# Patient Record
Sex: Female | Born: 1940 | Race: White | Hispanic: No | Smoking: Never smoker
Health system: Southern US, Community
[De-identification: ages and names within clinical notes are randomized; demographics above are authoritative.]

## PROBLEM LIST (undated history)

## (undated) DIAGNOSIS — I10 Essential (primary) hypertension: Secondary | ICD-10-CM

## (undated) DIAGNOSIS — E119 Type 2 diabetes mellitus without complications: Secondary | ICD-10-CM

## (undated) DIAGNOSIS — I4891 Unspecified atrial fibrillation: Secondary | ICD-10-CM

## (undated) DIAGNOSIS — C9000 Multiple myeloma not having achieved remission: Secondary | ICD-10-CM

## (undated) DIAGNOSIS — N289 Disorder of kidney and ureter, unspecified: Secondary | ICD-10-CM

## (undated) DIAGNOSIS — R42 Dizziness and giddiness: Secondary | ICD-10-CM

## (undated) HISTORY — PX: SMALL INTESTINE SURGERY: SHX150

## (undated) HISTORY — PX: ABDOMINAL HYSTERECTOMY: SHX81

---

## 2015-02-20 ENCOUNTER — Inpatient Hospital Stay
Admission: EM | Admit: 2015-02-20 | Discharge: 2015-02-23 | DRG: 149 | Disposition: A | Discharge status: Home / Self Care | Payer: Medicare (Managed Care) | Attending: Internal Medicine | Admitting: Internal Medicine

## 2015-02-20 DIAGNOSIS — R42 Dizziness and giddiness: Secondary | ICD-10-CM

## 2015-02-20 DIAGNOSIS — M7989 Other specified soft tissue disorders: Secondary | ICD-10-CM | POA: Diagnosis present

## 2015-02-20 DIAGNOSIS — Z833 Family history of diabetes mellitus: Secondary | ICD-10-CM

## 2015-02-20 DIAGNOSIS — E1165 Type 2 diabetes mellitus with hyperglycemia: Secondary | ICD-10-CM | POA: Diagnosis present

## 2015-02-20 DIAGNOSIS — E785 Hyperlipidemia, unspecified: Secondary | ICD-10-CM | POA: Diagnosis present

## 2015-02-20 DIAGNOSIS — R0902 Hypoxemia: Secondary | ICD-10-CM

## 2015-02-20 DIAGNOSIS — R7989 Other specified abnormal findings of blood chemistry: Secondary | ICD-10-CM

## 2015-02-20 DIAGNOSIS — R778 Other specified abnormalities of plasma proteins: Secondary | ICD-10-CM

## 2015-02-20 DIAGNOSIS — E871 Hypo-osmolality and hyponatremia: Secondary | ICD-10-CM | POA: Diagnosis present

## 2015-02-20 DIAGNOSIS — R748 Abnormal levels of other serum enzymes: Secondary | ICD-10-CM | POA: Diagnosis present

## 2015-02-20 DIAGNOSIS — R609 Edema, unspecified: Secondary | ICD-10-CM

## 2015-02-20 DIAGNOSIS — H8110 Benign paroxysmal vertigo, unspecified ear: Principal | ICD-10-CM | POA: Diagnosis present

## 2015-02-20 DIAGNOSIS — E1110 Type 2 diabetes mellitus with ketoacidosis without coma: Secondary | ICD-10-CM

## 2015-02-20 DIAGNOSIS — R739 Hyperglycemia, unspecified: Secondary | ICD-10-CM

## 2015-02-20 DIAGNOSIS — R112 Nausea with vomiting, unspecified: Secondary | ICD-10-CM

## 2015-02-20 DIAGNOSIS — I10 Essential (primary) hypertension: Secondary | ICD-10-CM | POA: Diagnosis present

## 2015-02-20 DIAGNOSIS — N289 Disorder of kidney and ureter, unspecified: Secondary | ICD-10-CM | POA: Diagnosis present

## 2015-02-20 DIAGNOSIS — N390 Urinary tract infection, site not specified: Secondary | ICD-10-CM | POA: Diagnosis present

## 2015-02-20 DIAGNOSIS — E8770 Fluid overload, unspecified: Secondary | ICD-10-CM | POA: Diagnosis present

## 2015-02-20 DIAGNOSIS — Z809 Family history of malignant neoplasm, unspecified: Secondary | ICD-10-CM

## 2015-02-20 DIAGNOSIS — Z7982 Long term (current) use of aspirin: Secondary | ICD-10-CM

## 2015-02-20 DIAGNOSIS — Z9079 Acquired absence of other genital organ(s): Secondary | ICD-10-CM | POA: Diagnosis present

## 2015-02-20 DIAGNOSIS — I159 Secondary hypertension, unspecified: Secondary | ICD-10-CM

## 2015-02-20 HISTORY — DX: Dizziness and giddiness: R42

## 2015-02-20 HISTORY — DX: Essential (primary) hypertension: I10

## 2015-02-20 HISTORY — DX: Type 2 diabetes mellitus without complications: E11.9

## 2015-02-21 ENCOUNTER — Encounter: Payer: Self-pay | Admitting: Emergency Medicine

## 2015-02-21 ENCOUNTER — Inpatient Hospital Stay: Payer: Medicare (Managed Care)

## 2015-02-21 ENCOUNTER — Inpatient Hospital Stay (HOSPITAL_COMMUNITY)
Admit: 2015-02-21 | Discharge: 2015-02-21 | Disposition: A | Discharge status: Home / Self Care | Payer: Medicare (Managed Care) | Attending: Internal Medicine | Admitting: Internal Medicine

## 2015-02-21 ENCOUNTER — Emergency Department: Payer: Medicare (Managed Care)

## 2015-02-21 DIAGNOSIS — E1165 Type 2 diabetes mellitus with hyperglycemia: Secondary | ICD-10-CM | POA: Diagnosis present

## 2015-02-21 DIAGNOSIS — Z7982 Long term (current) use of aspirin: Secondary | ICD-10-CM | POA: Diagnosis not present

## 2015-02-21 DIAGNOSIS — R739 Hyperglycemia, unspecified: Secondary | ICD-10-CM | POA: Diagnosis present

## 2015-02-21 DIAGNOSIS — Z809 Family history of malignant neoplasm, unspecified: Secondary | ICD-10-CM | POA: Diagnosis not present

## 2015-02-21 DIAGNOSIS — I10 Essential (primary) hypertension: Secondary | ICD-10-CM | POA: Diagnosis not present

## 2015-02-21 DIAGNOSIS — R42 Dizziness and giddiness: Secondary | ICD-10-CM | POA: Diagnosis present

## 2015-02-21 DIAGNOSIS — E785 Hyperlipidemia, unspecified: Secondary | ICD-10-CM | POA: Diagnosis present

## 2015-02-21 DIAGNOSIS — H8110 Benign paroxysmal vertigo, unspecified ear: Secondary | ICD-10-CM | POA: Diagnosis present

## 2015-02-21 DIAGNOSIS — R778 Other specified abnormalities of plasma proteins: Secondary | ICD-10-CM | POA: Diagnosis present

## 2015-02-21 DIAGNOSIS — I119 Hypertensive heart disease without heart failure: Secondary | ICD-10-CM

## 2015-02-21 DIAGNOSIS — R748 Abnormal levels of other serum enzymes: Secondary | ICD-10-CM | POA: Diagnosis present

## 2015-02-21 DIAGNOSIS — M7989 Other specified soft tissue disorders: Secondary | ICD-10-CM | POA: Diagnosis present

## 2015-02-21 DIAGNOSIS — R7989 Other specified abnormal findings of blood chemistry: Secondary | ICD-10-CM | POA: Diagnosis not present

## 2015-02-21 DIAGNOSIS — Z9079 Acquired absence of other genital organ(s): Secondary | ICD-10-CM | POA: Diagnosis present

## 2015-02-21 DIAGNOSIS — N289 Disorder of kidney and ureter, unspecified: Secondary | ICD-10-CM | POA: Diagnosis present

## 2015-02-21 DIAGNOSIS — E871 Hypo-osmolality and hyponatremia: Secondary | ICD-10-CM | POA: Diagnosis present

## 2015-02-21 DIAGNOSIS — Z833 Family history of diabetes mellitus: Secondary | ICD-10-CM | POA: Diagnosis not present

## 2015-02-21 DIAGNOSIS — N390 Urinary tract infection, site not specified: Secondary | ICD-10-CM | POA: Diagnosis present

## 2015-02-21 DIAGNOSIS — R0902 Hypoxemia: Secondary | ICD-10-CM | POA: Diagnosis present

## 2015-02-21 DIAGNOSIS — E8770 Fluid overload, unspecified: Secondary | ICD-10-CM | POA: Diagnosis present

## 2015-02-21 LAB — BASIC METABOLIC PANEL
Anion gap: 5 (ref 5–15)
BUN: 15 mg/dL (ref 6–20)
CO2: 24 mmol/L (ref 22–32)
Calcium: 7.8 mg/dL — ABNORMAL LOW (ref 8.9–10.3)
Chloride: 106 mmol/L (ref 101–111)
Creatinine, Ser: 1.11 mg/dL — ABNORMAL HIGH (ref 0.44–1.00)
GFR calc Af Amer: 55 mL/min — ABNORMAL LOW (ref >60)
GFR calc non Af Amer: 47 mL/min — ABNORMAL LOW (ref >60)
GLUCOSE: 367 mg/dL — AB (ref 65–99)
Potassium: 3.8 mmol/L (ref 3.5–5.1)
SODIUM: 135 mmol/L (ref 135–145)

## 2015-02-21 LAB — TROPONIN I
TROPONIN I: 0.07 ng/mL — AB (ref <0.031)
Troponin I: 0.04 ng/mL — ABNORMAL HIGH (ref <0.031)
Troponin I: 0.07 ng/mL — ABNORMAL HIGH (ref <0.031)

## 2015-02-21 LAB — URINALYSIS COMPLETE WITH MICROSCOPIC (ARMC ONLY)
BACTERIA UA: NONE SEEN
BILIRUBIN URINE: NEGATIVE
KETONES UR: NEGATIVE mg/dL
LEUKOCYTES UA: NEGATIVE
Nitrite: NEGATIVE
Protein, ur: >500 mg/dL — AB
Specific Gravity, Urine: 1.025 (ref 1.005–1.030)
pH: 5 (ref 5.0–8.0)

## 2015-02-21 LAB — GLUCOSE, CAPILLARY
GLUCOSE-CAPILLARY: 355 mg/dL — AB (ref 65–99)
Glucose-Capillary: 393 mg/dL — ABNORMAL HIGH (ref 65–99)
Glucose-Capillary: 308 mg/dL — ABNORMAL HIGH (ref 65–99)
Glucose-Capillary: 319 mg/dL — ABNORMAL HIGH (ref 65–99)
Glucose-Capillary: 254 mg/dL — ABNORMAL HIGH (ref 65–99)

## 2015-02-21 LAB — COMPREHENSIVE METABOLIC PANEL
ALBUMIN: 2.1 g/dL — AB (ref 3.5–5.0)
ALT: 24 U/L (ref 14–54)
AST: 44 U/L — AB (ref 15–41)
Alkaline Phosphatase: 82 U/L (ref 38–126)
Anion gap: 6 (ref 5–15)
BUN: 15 mg/dL (ref 6–20)
CALCIUM: 8.2 mg/dL — AB (ref 8.9–10.3)
CO2: 25 mmol/L (ref 22–32)
Chloride: 102 mmol/L (ref 101–111)
Creatinine, Ser: 1.23 mg/dL — ABNORMAL HIGH (ref 0.44–1.00)
GFR calc Af Amer: 48 mL/min — ABNORMAL LOW (ref >60)
GFR calc non Af Amer: 42 mL/min — ABNORMAL LOW (ref >60)
Glucose, Bld: 482 mg/dL — ABNORMAL HIGH (ref 65–99)
Potassium: 3.6 mmol/L (ref 3.5–5.1)
SODIUM: 133 mmol/L — AB (ref 135–145)
TOTAL PROTEIN: 7.7 g/dL (ref 6.5–8.1)
Total Bilirubin: 0.6 mg/dL (ref 0.3–1.2)

## 2015-02-21 LAB — CBC
HEMATOCRIT: 42.5 % (ref 35.0–47.0)
HEMOGLOBIN: 14.4 g/dL (ref 12.0–16.0)
MCH: 33 pg (ref 26.0–34.0)
MCHC: 33.9 g/dL (ref 32.0–36.0)
MCV: 97.5 fL (ref 80.0–100.0)
Platelets: 184 10*3/uL (ref 150–440)
RBC: 4.36 MIL/uL (ref 3.80–5.20)
RDW: 13.5 % (ref 11.5–14.5)
WBC: 7.2 10*3/uL (ref 3.6–11.0)

## 2015-02-21 LAB — LIPID PANEL
CHOLESTEROL: 203 mg/dL — AB (ref 0–200)
HDL: 37 mg/dL — ABNORMAL LOW (ref >40)
LDL Cholesterol: 141 mg/dL — ABNORMAL HIGH (ref 0–99)
TRIGLYCERIDES: 127 mg/dL (ref <150)
Total CHOL/HDL Ratio: 5.5 RATIO
VLDL: 25 mg/dL (ref 0–40)

## 2015-02-21 MED ORDER — DIAZEPAM 5 MG PO TABS
5.0000 mg | ORAL_TABLET | Freq: Four times a day (QID) | ORAL | Status: DC | PRN
Start: 2015-02-21 — End: 2015-02-23

## 2015-02-21 MED ORDER — HYDROCHLOROTHIAZIDE 12.5 MG PO CAPS
12.5000 mg | ORAL_CAPSULE | Freq: Every day | ORAL | Status: DC
  Administered 2015-02-21 – 2015-02-23 (×3): 12.5 mg via ORAL
  Filled 2015-02-21 (×3): qty 1

## 2015-02-21 MED ORDER — SODIUM CHLORIDE 0.9 % IJ SOLN
3.0000 mL | Freq: Two times a day (BID) | INTRAMUSCULAR | Status: DC
  Administered 2015-02-21 – 2015-02-23 (×4): 3 mL via INTRAVENOUS

## 2015-02-21 MED ORDER — ACETAMINOPHEN 325 MG PO TABS: 650.0000 mg | ORAL_TABLET | Freq: Four times a day (QID) | ORAL | Status: DC | PRN

## 2015-02-21 MED ORDER — DIAZEPAM 5 MG/ML IJ SOLN
INTRAMUSCULAR | Status: AC
  Filled 2015-02-21: qty 2

## 2015-02-21 MED ORDER — DIAZEPAM 5 MG/ML IJ SOLN
2.0000 mg | Freq: Once | INTRAMUSCULAR | Status: AC
  Administered 2015-02-21: 2 mg via INTRAVENOUS

## 2015-02-21 MED ORDER — MECLIZINE HCL 25 MG PO TABS: 25.0000 mg | ORAL_TABLET | Freq: Three times a day (TID) | ORAL | Status: DC | PRN

## 2015-02-21 MED ORDER — SODIUM CHLORIDE 0.9 % IV SOLN
INTRAVENOUS | Status: DC
  Administered 2015-02-21: 05:00:00 via INTRAVENOUS

## 2015-02-21 MED ORDER — LISINOPRIL 5 MG PO TABS
5.0000 mg | ORAL_TABLET | Freq: Every day | ORAL | Status: DC
  Administered 2015-02-21 – 2015-02-23 (×3): 5 mg via ORAL
  Filled 2015-02-21 (×3): qty 1

## 2015-02-21 MED ORDER — SODIUM CHLORIDE 0.9 % IV BOLUS (SEPSIS)
1000.0000 mL | Freq: Once | INTRAVENOUS | Status: AC
  Administered 2015-02-21: 1000 mL via INTRAVENOUS

## 2015-02-21 MED ORDER — METHYLPREDNISOLONE 4 MG PO TBPK
8.0000 mg | ORAL_TABLET | Freq: Every morning | ORAL | Status: AC
  Administered 2015-02-21: 8 mg via ORAL
  Filled 2015-02-21: qty 21

## 2015-02-21 MED ORDER — METHYLPREDNISOLONE 4 MG PO TBPK
8.0000 mg | ORAL_TABLET | Freq: Every evening | ORAL | Status: AC
  Administered 2015-02-21: 8 mg via ORAL

## 2015-02-21 MED ORDER — ONDANSETRON HCL 4 MG/2ML IJ SOLN
4.0000 mg | Freq: Once | INTRAMUSCULAR | Status: AC
  Administered 2015-02-21: 4 mg via INTRAVENOUS

## 2015-02-21 MED ORDER — METOPROLOL TARTRATE 25 MG PO TABS
12.5000 mg | ORAL_TABLET | Freq: Two times a day (BID) | ORAL | Status: DC
  Administered 2015-02-21: 12.5 mg via ORAL
  Filled 2015-02-21: qty 1

## 2015-02-21 MED ORDER — SODIUM CHLORIDE 0.9 % IV BOLUS (SEPSIS)
500.0000 mL | Freq: Once | INTRAVENOUS | Status: AC
  Administered 2015-02-21: 500 mL via INTRAVENOUS

## 2015-02-21 MED ORDER — ONDANSETRON HCL 4 MG/2ML IJ SOLN
INTRAMUSCULAR | Status: AC
  Filled 2015-02-21: qty 2

## 2015-02-21 MED ORDER — ONDANSETRON HCL 4 MG/2ML IJ SOLN: 4.0000 mg | Freq: Four times a day (QID) | INTRAMUSCULAR | Status: DC | PRN

## 2015-02-21 MED ORDER — ASPIRIN EC 81 MG PO TBEC
81.0000 mg | DELAYED_RELEASE_TABLET | Freq: Every day | ORAL | Status: DC
  Administered 2015-02-21 – 2015-02-23 (×3): 81 mg via ORAL
  Filled 2015-02-21 (×3): qty 1

## 2015-02-21 MED ORDER — INSULIN ASPART 100 UNIT/ML ~~LOC~~ SOLN
0.0000 [IU] | Freq: Three times a day (TID) | SUBCUTANEOUS | Status: DC
  Administered 2015-02-21 (×2): 7 [IU] via SUBCUTANEOUS
  Administered 2015-02-21: 5 [IU] via SUBCUTANEOUS
  Administered 2015-02-22: 7 [IU] via SUBCUTANEOUS
  Administered 2015-02-22: 2 [IU] via SUBCUTANEOUS
  Administered 2015-02-22: 9 [IU] via SUBCUTANEOUS
  Filled 2015-02-21: qty 7
  Filled 2015-02-21: qty 9
  Filled 2015-02-21: qty 7
  Filled 2015-02-21: qty 5
  Filled 2015-02-21: qty 2
  Filled 2015-02-21: qty 7

## 2015-02-21 MED ORDER — INSULIN ASPART 100 UNIT/ML ~~LOC~~ SOLN
8.0000 [IU] | Freq: Once | SUBCUTANEOUS | Status: AC
  Administered 2015-02-21: 8 [IU] via SUBCUTANEOUS

## 2015-02-21 MED ORDER — ATORVASTATIN CALCIUM 20 MG PO TABS
40.0000 mg | ORAL_TABLET | Freq: Every day | ORAL | Status: DC
  Administered 2015-02-21 – 2015-02-22 (×2): 40 mg via ORAL
  Filled 2015-02-21 (×2): qty 2

## 2015-02-21 MED ORDER — ACETAMINOPHEN 650 MG RE SUPP: 650.0000 mg | Freq: Four times a day (QID) | RECTAL | Status: DC | PRN

## 2015-02-21 MED ORDER — METHYLPREDNISOLONE 4 MG PO TBPK
4.0000 mg | ORAL_TABLET | Freq: Four times a day (QID) | ORAL | Status: DC
  Administered 2015-02-23 (×2): 4 mg via ORAL

## 2015-02-21 MED ORDER — METHYLPREDNISOLONE 4 MG PO TBPK
4.0000 mg | ORAL_TABLET | Freq: Three times a day (TID) | ORAL | Status: AC
  Administered 2015-02-22 (×3): 4 mg via ORAL

## 2015-02-21 MED ORDER — ONDANSETRON HCL 4 MG PO TABS: 4.0000 mg | ORAL_TABLET | Freq: Four times a day (QID) | ORAL | Status: DC | PRN

## 2015-02-21 MED ORDER — METHYLPREDNISOLONE 4 MG PO TBPK
4.0000 mg | ORAL_TABLET | ORAL | Status: AC
  Administered 2015-02-21: 4 mg via ORAL

## 2015-02-21 MED ORDER — METHYLPREDNISOLONE 4 MG PO TBPK
8.0000 mg | ORAL_TABLET | Freq: Every evening | ORAL | Status: AC
  Administered 2015-02-22: 8 mg via ORAL

## 2015-02-21 MED ORDER — MECLIZINE HCL 25 MG PO TABS
25.0000 mg | ORAL_TABLET | Freq: Three times a day (TID) | ORAL | Status: DC
  Administered 2015-02-21 – 2015-02-23 (×7): 25 mg via ORAL
  Filled 2015-02-21 (×7): qty 1

## 2015-02-21 MED ORDER — HEPARIN SODIUM (PORCINE) 5000 UNIT/ML IJ SOLN
5000.0000 [IU] | Freq: Three times a day (TID) | INTRAMUSCULAR | Status: DC
  Administered 2015-02-21 – 2015-02-23 (×7): 5000 [IU] via SUBCUTANEOUS
  Filled 2015-02-21 (×7): qty 1

## 2015-02-21 MED ORDER — INSULIN ASPART 100 UNIT/ML ~~LOC~~ SOLN
SUBCUTANEOUS | Status: AC
  Filled 2015-02-21: qty 8

## 2015-02-21 NOTE — Progress Notes (Signed)
*  PRELIMINARY RESULTS* Echocardiogram 2D Echocardiogram has been performed.  Lisa Lowery 02/21/2015, 10:51 AM

## 2015-02-21 NOTE — Consult Note (Addendum)
CARDIOLOGY CONSULT NOTE     Primary Care Physician: No PCP Per Patient Referring Physician:   hospitalist  Admit Date: 02/20/2015  Reason for consultation:  Elevated  troponin  Lisa Lowery is a 74 y.o. female with a h/o hypertension and vertigo who is admitted with dizziness.  She reports having vertiginous spinning similar to prior vertigo.  She denies chest pain.  She has rare chest tightness/ SOB.  She has had BLE swelling for "15 years", perhaps mildly worse now.  She presents for evaluation of her vertigo and is noted to have a very elevated bp as well as elevated blood sugars and proteinuria.  Today, she denies symptoms of palpitations, chest pain,  orthopnea, PND, presyncope, syncope, or neurologic sequela. The patient is tolerating medications without difficulties and is otherwise without complaint today.   Past Medical History  Diagnosis Date  . Vertigo   . Hypertension   . Diabetes    Past Surgical History  Procedure Laterality Date  . Small intestine surgery    . Abdominal hysterectomy      . aspirin EC  81 mg Oral Daily  . atorvastatin  40 mg Oral q1800  . heparin  5,000 Units Subcutaneous 3 times per day  . insulin aspart  0-9 Units Subcutaneous TID WC  . meclizine  25 mg Oral TID  . methylPREDNISolone  4 mg Oral PC supper  . [START ON 02/22/2015] methylPREDNISolone  4 mg Oral 3 x daily with food  . [START ON 02/23/2015] methylPREDNISolone  4 mg Oral 4X daily taper  . methylPREDNISolone  8 mg Oral Nightly  . [START ON 02/22/2015] methylPREDNISolone  8 mg Oral Nightly  . metoprolol tartrate  12.5 mg Oral BID  . sodium chloride  3 mL Intravenous Q12H   . sodium chloride 75 mL/hr at 02/21/15 0620    No Known Allergies  History   Social History  . Marital Status: Divorced    Spouse Name: N/A  . Number of Children: N/A  . Years of Education: N/A   Occupational History  . Not on file.   Social History Main Topics  . Smoking status: Never Smoker   .  Smokeless tobacco: Never Used  . Alcohol Use: No  . Drug Use: No  . Sexual Activity: No   Other Topics Concern  . Not on file   Social History Narrative    Family History  Problem Relation Age of Onset  . Cancer Mother   . Cancer Father   . Diabetes Sister     ROS- All systems are reviewed and negative except as per the HPI above  Physical Exam: Telemetry: Filed Vitals:   02/21/15 0330 02/21/15 0345 02/21/15 0510 02/21/15 1130  BP: 115/49  137/67 161/64  Pulse: 81 81 81 85  Temp:   97.7 F (36.5 C) 98.6 F (37 C)  TempSrc:   Oral Oral  Resp: 12 13 18 18   Height:      Weight:   87.635 kg (193 lb 3.2 oz)   SpO2: 95% 95% 97% 94%    GEN- The patient is overweight appearing, alert and oriented x 3 today.   Head- normocephalic, atraumatic Eyes-  Sclera clear, conjunctiva pink Ears- hearing intact Oropharynx- clear Neck- supple, + JVD Lymph- no cervical lymphadenopathy Lungs- Clear to ausculation bilaterally, normal work of breathing Heart- Regular rate and rhythm, no murmurs, rubs or gallops, PMI not laterally displaced GI- soft, NT, ND, + BS Extremities- no clubbing, cyanosis,trace edema, + venous  insufficiency MS- no significant deformity or atrophy Skin- no rash or lesion Psych- euthymic mood, full affect Neuro- strength and sensation are intact  EKG: reveals sinus rhythm with LAD, no ischemic changes  Labs:   Lab Results  Component Value Date   WBC 7.2 02/21/2015   HGB 14.4 02/21/2015   HCT 42.5 02/21/2015   MCV 97.5 02/21/2015   PLT 184 02/21/2015    Recent Labs Lab 02/21/15 0032 02/21/15 0535  NA 133* 135  K 3.6 3.8  CL 102 106  CO2 25 24  BUN 15 15  CREATININE 1.23* 1.11*  CALCIUM 8.2* 7.8*  PROT 7.7  --   BILITOT 0.6  --   ALKPHOS 82  --   ALT 24  --   AST 44*  --   GLUCOSE 482* 367*   Lab Results  Component Value Date   TROPONINI 0.07* 02/21/2015    Lab Results  Component Value Date   CHOL 203* 02/21/2015   Lab Results    Component Value Date   HDL 37* 02/21/2015   Lab Results  Component Value Date   LDLCALC 141* 02/21/2015   Lab Results  Component Value Date   TRIG 127 02/21/2015   Lab Results  Component Value Date   CHOLHDL 5.5 02/21/2015   No results found for: LDLDIRECT    Echo:  Normal echo  ASSESSMENT AND PLAN:   1. Elevated troponin Troponin is minimally elevated and flat.  There are no ischemic ekg changes or symptoms of ischemia.  She is not having an MI. Given mild SOB/ chest tightness and risk factors, outpatient stress testing would be reasonable once her vertigo/ BP are better managed.  2. Hypertension Elevated/ labile BP Lifestyle modification is encouraged Would add ace inhibitor given proteinuria 2 gram sodium restriction Add low dose diuretic as she is volume overloaded on exam  3. Diabetes New diagnosis Will require aggressive lifestyle change Per primary team  4. HL Statin has already been added  No further inpatient CV testing Outpatient stress testing as above  Thompson Grayer, MD 02/21/2015  2:58 PM

## 2015-02-21 NOTE — Progress Notes (Signed)
Patient alert and oriented x4, no complaints at this time. vss at this time. Patient NSR on telemetry. Will continue to assess. Lisa Lowery R [place]  

## 2015-02-21 NOTE — ED Notes (Signed)
Pt states improved dizziness. No emesis since arrival.

## 2015-02-21 NOTE — ED Notes (Signed)
Sons phone number [phone]

## 2015-02-21 NOTE — ED Notes (Signed)
Pt states had to have bowel movement, became dizzy during bowel movement. perrl 31mm brisk, cms intact to all extremities. Pt denies pain, denies nausea. Pt states dizziness continues.

## 2015-02-21 NOTE — H&P (Signed)
[facility] [place] NAME: Lisa Lowery    MR#:  [id number]  DATE OF BIRTH:  12-09-1940  DATE OF ADMISSION:  02/20/2015  PRIMARY CARE PHYSICIAN: No PCP Per Patient   REQUESTING/REFERRING PHYSICIAN: Dr. Beather Arbour  CHIEF COMPLAINT:   Chief Complaint  Patient presents with  . Dizziness    HISTORY OF PRESENT ILLNESS:  Lisa Lowery  is a 74 y.o. female with a known history of vertigo, hypertension, prediabetes presents to the emergency room with the complaints of acute onset of dizziness with spinning of the head while she was trying to go to the bathroom. Denies any associated chest pain, shortness of breath, palpitations, loss of consciousness, focal weakness or numbness. Did have one episode of emesis in the ED. No recent fever, cough, URI symptoms, illness. Denies any urinary symptoms, abdominal pain. Patient gives a history of dizziness secondary to vertigo one and half years ago for which she took meclizine. History of hypertension but not on any medications at home. Denies any history of cardiac disease. In the emergency room patient was evaluated by the ED physician, received Valium and Zofran and currently her dizziness is improving. He does have some mild dizziness only on movements of head. Denies any further nausea or vomiting. Workup in the ED revealed negative CT of the head. Lab work significant for blood sugars of 482, creatinine 1.23, normal anion gap, mildly elevated troponin of 0.04. EKG normal sinus rhythm with ventricular rate of 96 bpm, left anterior fascicular block, nonspecific abnormality. Patient's systolic blood pressure was initially noted to be high at 197 and currently her systolic blood pressure is 159/81. Hospitalist service was consulted for further management in view of mildly elevated troponin and elevated blood sugars. Patient is comfortably resting in the bed at this time and denies any complains except for mild dizziness  on movements of the head.  PAST MEDICAL HISTORY:   Past Medical History  Diagnosis Date  . Vertigo   . Hypertension     PAST SURGICAL HISTORY:   Past Surgical History  Procedure Laterality Date  . Small intestine surgery    . Abdominal hysterectomy      SOCIAL HISTORY:   History  Substance Use Topics  . Smoking status: Never Smoker   . Smokeless tobacco: Never Used  . Alcohol Use: No    FAMILY HISTORY:   Family History  Problem Relation Age of Onset  . Cancer Mother   . Cancer Father   . Diabetes Sister     DRUG ALLERGIES:  No Known Allergies  REVIEW OF SYSTEMS:   Review of Systems  Constitutional: Negative for fever, chills and malaise/fatigue.  HENT: Negative for ear pain, hearing loss, nosebleeds, sore throat and tinnitus.   Eyes: Negative for blurred vision, double vision, pain, discharge and redness.  Respiratory: Negative for cough, hemoptysis, sputum production, shortness of breath and wheezing.   Cardiovascular: Negative for chest pain, palpitations, orthopnea and leg swelling.  Gastrointestinal: Positive for vomiting. Negative for nausea, abdominal pain, diarrhea, constipation, blood in stool and melena.       One episode of emesis in the ED as noted in history of present illness.  Genitourinary: Negative for dysuria, urgency, frequency and hematuria.  Musculoskeletal: Negative for back pain, joint pain and neck pain.  Skin: Negative for itching and rash.  Neurological: Positive for dizziness. Negative for tingling, sensory change, focal weakness and seizures.  Endo/Heme/Allergies: Does not bruise/bleed easily.  Psychiatric/Behavioral: Negative  for depression. The patient is not nervous/anxious.     MEDICATIONS AT HOME:   Prior to Admission medications   Not on File      VITAL SIGNS:  Blood pressure 197/88, pulse 94, temperature 98.5 F (36.9 C), temperature source Oral, resp. rate 14, height 5\' 7"  (1.702 m), weight 86.637 kg (191 lb), SpO2  100 %.  PHYSICAL EXAMINATION:  Physical Exam  Constitutional: She is oriented to person, place, and time. She appears well-developed and well-nourished. No distress.  HENT:  Head: Normocephalic and atraumatic.  Right Ear: External ear normal.  Left Ear: External ear normal.  Nose: Nose normal.  Mouth/Throat: Oropharynx is clear and moist. No oropharyngeal exudate.  Eyes: EOM are normal. Pupils are equal, round, and reactive to light. No scleral icterus.  Neck: Normal range of motion. Neck supple. No JVD present. No thyromegaly present.  Cardiovascular: Normal rate, regular rhythm, normal heart sounds and intact distal pulses.  Exam reveals no friction rub.   No murmur heard. Respiratory: Effort normal and breath sounds normal. No respiratory distress. She has no wheezes. She has no rales. She exhibits no tenderness.  GI: Soft. Bowel sounds are normal. She exhibits no distension and no mass. There is no tenderness. There is no rebound and no guarding.  Musculoskeletal: Normal range of motion. She exhibits no edema.  Lymphadenopathy:    She has no cervical adenopathy.  Neurological: She is alert and oriented to person, place, and time. She has normal reflexes. She displays normal reflexes. No cranial nerve deficit. She exhibits normal muscle tone.  Skin: Skin is warm. No rash noted. No erythema.  Psychiatric: She has a normal mood and affect. Her behavior is normal. Thought content normal.   LABORATORY PANEL:   CBC  Recent Labs Lab 02/21/15 0032  WBC 7.2  HGB 14.4  HCT 42.5  PLT 184   ------------------------------------------------------------------------------------------------------------------  Chemistries   Recent Labs Lab 02/21/15 0032  NA 133*  K 3.6  CL 102  CO2 25  GLUCOSE 482*  BUN 15  CREATININE 1.23*  CALCIUM 8.2*  AST 44*  ALT 24  ALKPHOS 82  BILITOT 0.6    ------------------------------------------------------------------------------------------------------------------  Cardiac Enzymes  Recent Labs Lab 02/21/15 0032  TROPONINI 0.04*   ------------------------------------------------------------------------------------------------------------------  RADIOLOGY:  Ct Head Wo Contrast  02/21/2015   CLINICAL DATA:  Acute onset of dizziness during bowel movement. Initial encounter.  EXAM: CT HEAD WITHOUT CONTRAST  TECHNIQUE: Contiguous axial images were obtained from the base of the skull through the vertex without intravenous contrast.  COMPARISON:  None.  FINDINGS: There is no evidence of acute infarction, mass lesion, or intra- or extra-axial hemorrhage on CT.  Prominence of the sulci suggests mild cortical volume loss. Mild cerebellar atrophy is noted.  The posterior fossa, including the cerebellum, brainstem and fourth ventricle, is within normal limits. The third and lateral ventricles, and basal ganglia are unremarkable in appearance. The cerebral hemispheres are symmetric in appearance, with normal gray-white differentiation. No mass effect or midline shift is seen.  There is no evidence of fracture; visualized osseous structures are unremarkable in appearance. The visualized portions of the orbits are within normal limits. The paranasal sinuses and mastoid air cells are well-aerated. No significant soft tissue abnormalities are seen.  IMPRESSION: 1. No acute intracranial pathology seen on CT. 2. Mild cortical volume loss noted.   Electronically Signed   By: Garald Balding M.D.   On: 02/21/2015 01:10    EKG:   Orders placed or performed  during the hospital encounter of 02/20/15  . ED EKG (<55mins upon arrival to the ED) normal sinus rhythm with ventricular rate of 96 bpm, left anterior fascicular block, nonspecific ST abnormality.   . ED EKG (<8mins upon arrival to the ED)    IMPRESSION AND PLAN:   1. Dizziness. History of vertigo. CT  head negative and normal neuro examination. Increased symptoms with movements of head likely vertigo. Plan: Monitor, meclizine when necessary, consider further workup if symptoms persist. 2. Elevated troponin, trace. No chest pain/shortness of breath, no history of prior coronary artery disease. Likely demand ischemia, rule out ACS. Plan: Admit to telemetry, aspirin, cycle cardiac enzymes, low dose beta blocker in view of history of hypertension and elevated blood pressure, order 2-D echocardiogram. Consider cardiology consultation if patient develops any cardiac symptoms or cardiac enzymes trend or abnormal echocardiogram. Check fasting lipids. 3. History of hypertension, not on any home medications. BP mild high. Will start low-dose beta blocker, monitor blood pressure recordings. 4. Elevated blood sugar, history of prediabetes. Rule out diabetes mellitus. No symptoms. Patient received 8 units of insulin in the ED.  Plan: Monitor blood sugars closely, sliding scale insulin, check HbA1c, follow-up accordingly. 5. Mild elevated creatinine of 1.23. No history of kidney disease. Likely dehydration. Plan: Gentle IV hydration, follow-up BMP.    All the records are reviewed and case discussed with ED provider. Management plans discussed with the patient, family and they are in agreement.  CODE STATUS: Full code  TOTAL TIME TAKING CARE OF THIS PATIENT: 50 minutes.    Azucena Freed N M.D on 02/21/2015 at 2:15 AM  Between 7am to 6pm - Pager - [phone]  After 6pm go to [email or web address] - password [email or web address] [facility]  Office  [phone]  CC: Primary care physician; No PCP Per Patient

## 2015-02-21 NOTE — Evaluation (Signed)
Physical Therapy Evaluation Patient Details Name: Lisa Lowery MRN: [id number] DOB: [birth year] Today's Date: 02/21/2015   History of Present Illness  patient presented to emergency room with vertigo and was noted to have markedly elevated blood pressure to 825K systolic. She was given meclizine and value after which her symptoms somewhat subsided, although patient admits of having intermittent dizziness whenever she moves around. She also complains of lower extremity swelling, especially left lower extremity recently. Has been moving to [place] to live with her son and the is engaged in loss of moving related stresses.  Clinical Impression  Pt is a 74 yo female who just traveled from [place], [place] to visit/move-in with her son.  Pt is being treated for a bout of vertigo which is improving, but continues to present with change in position especially from supine to sit.  Pt is Modified independent with transfers and ambulates 95' with RW and stand-by assist.  Pt had one balance check while standing from vertigo, but was able to maintain posture and self-correct without assitance.  Pt would benefit from skilled PT services while inpatient and HHPT once discharged.  Pt does not have a RW in [place].    Follow Up Recommendations Home health PT    Equipment Recommendations   RW    Recommendations for Other Services       Precautions / Restrictions Precautions Precautions: Fall      Mobility  Bed Mobility Overal bed mobility: Modified Independent             General bed mobility comments: One short bout of vertigo when rising to sit, pt jerked back, then sat upright; pt able to self-correct independnetly  Transfers Overall transfer level: Modified independent Equipment used: Rolling walker (2 wheeled)             General transfer comment: verbal cues to push up from bed and not to pull on RW  Ambulation/Gait Ambulation/Gait assistance: Supervision Ambulation Distance (Feet):  60 Feet Assistive device: Rolling walker (2 wheeled) Gait Pattern/deviations: Step-through pattern   Gait velocity interpretation: Below normal speed for age/gender General Gait Details: Initaially one balance correction with a side-step, then for rest of gait pt with no signs or symptoms of vertigo.  Pt performed stops and turns to both L/R side without trouble.  Stairs            Wheelchair Mobility    Modified Rankin (Stroke Patients Only)       Balance Overall balance assessment: Modified Independent                                           Pertinent Vitals/Pain Pain Assessment: No/denies pain    Home Living Family/patient expects to be discharged to:: Private residence Living Arrangements: Other relatives Available Help at Discharge: Family Type of Home: House Home Access: Stairs to enter;Ramped entrance   Entrance Stairs-Number of Steps: 2 Home Layout: Two level;Able to live on main level with bedroom/bathroom Home Equipment: Gilford Rile - 2 wheels Additional Comments: Gilford Rile is in [facility] home    Prior Function Level of Independence: Independent with assistive device(s)         Comments: limited community ambulator, uses electric shopping carts when out in stores     Hand Dominance        Extremity/Trunk Assessment   Upper Extremity Assessment: Overall WFL for tasks assessed  Lower Extremity Assessment: Overall WFL for tasks assessed         Communication   Communication: No difficulties  Cognition Arousal/Alertness: Awake/alert Behavior During Therapy: WFL for tasks assessed/performed Overall Cognitive Status: Within Functional Limits for tasks assessed                      General Comments      Exercises        Assessment/Plan    PT Assessment Patient needs continued PT services  PT Diagnosis Abnormality of gait   PT Problem List Decreased balance  PT Treatment Interventions DME  instruction;Gait training;Functional mobility training;Therapeutic activities;Therapeutic exercise;Balance training;Patient/family education   PT Goals (Current goals can be found in the Care Plan section) Acute Rehab PT Goals Patient Stated Goal: "I am going to move in with my son." PT Goal Formulation: With patient Time For Goal Achievement: 03/07/15 Potential to Achieve Goals: Fair    Frequency Min 2X/week   Barriers to discharge   none    Co-evaluation               End of Session Equipment Utilized During Treatment: Gait belt Activity Tolerance: Patient tolerated treatment well Patient left: in bed;with family/visitor present Nurse Communication: Mobility status         Time: 1130-1200 PT Time Calculation (min) (ACUTE ONLY): 30 min   Charges:   PT Evaluation $Initial PT Evaluation Tier I: 1 Procedure PT Treatments $Gait Training: 8-22 mins   PT G Codes:        Lisa Lowery 2015/03/15, 12:21 PM

## 2015-02-21 NOTE — ED Provider Notes (Signed)
[facility] Emergency Department Provider Note  ____________________________________________  Time seen: Approximately 1:14 AM  I have reviewed the triage vital signs and the nursing notes.   HISTORY  Chief Complaint Dizziness    HPI Lisa Lowery is a 74 y.o. female who presents to the ED via EMS for complaints of dizziness and vomiting. Patient traveled from [place] today to live with her son. States she was in her usual state of health this evening until she went to the restroom to have a bowel movement. States shortly afterwards she turned her head to the left and experienced sudden onset dizziness, room spinning associated with nausea and vomiting. Patient arrives actively vomiting and hypertensive. States she had a similar episode 2 weeks ago. Denies fever, chills, chest pain, shortness of breath, abdominal pain, diarrhea, headache, numbness, tingling.   Past Medical History  Diagnosis Date  . Vertigo   . Hypertension     There are no active problems to display for this patient.   Past Surgical History  Procedure Laterality Date  . Small intestine surgery    . Abdominal hysterectomy      No current outpatient prescriptions on file.  Allergies Review of patient's allergies indicates no known allergies.  History reviewed. No pertinent family history.  Social History History  Substance Use Topics  . Smoking status: Never Smoker   . Smokeless tobacco: Never Used  . Alcohol Use: No    Review of Systems Constitutional: No fever/chills Eyes: No visual changes. ENT: No sore throat. Cardiovascular: Denies chest pain. Respiratory: Denies shortness of breath. Gastrointestinal: No abdominal pain. Positive for nausea and vomiting.  No diarrhea.  No constipation. Genitourinary: Negative for dysuria. Musculoskeletal: Negative for back pain. Skin: Negative for rash. Neurological: Positive for dizziness. Negative for headaches, focal  weakness or numbness.  10-point ROS otherwise negative.  ____________________________________________   PHYSICAL EXAM:  VITAL SIGNS: ED Triage Vitals  Enc Vitals Group     BP 02/21/15 0003 197/88 mmHg     Pulse Rate 02/21/15 0003 94     Resp 02/21/15 0003 18     Temp 02/21/15 0003 98.4 F (36.9 C)     Temp Source 02/21/15 0003 Oral     SpO2 02/21/15 0003 94 %     Weight 02/21/15 0018 191 lb (86.637 kg)     Height 02/21/15 0018 5\' 7"  (1.702 m)     Head Cir --      Peak Flow --      Pain Score --      Pain Loc --      Pain Edu? --      Excl. in [place]? --     Constitutional: Alert and oriented. Ill-appearing and in moderate acute distress. Eyes: Conjunctivae are normal. PERRL. EOMI. Head: Atraumatic. Nose: No congestion/rhinnorhea. Mouth/Throat: Mucous membranes are moist.  Oropharynx non-erythematous. Neck: No stridor.   Cardiovascular: Normal rate, regular rhythm. Grossly normal heart sounds.  Good peripheral circulation. Respiratory: Normal respiratory effort.  No retractions. Lungs CTAB. Gastrointestinal: Soft and nontender. No distention. No abdominal bruits. No CVA tenderness. Actively vomiting. Musculoskeletal: No lower extremity tenderness nor edema.  No joint effusions. Neurologic:  Normal speech and language. No gross focal neurologic deficits are appreciated. Speech is normal. Skin:  Skin is warm, dry and intact. No rash noted. Psychiatric: Mood and affect are normal. Speech and behavior are normal.  ____________________________________________   LABS (all labs ordered are listed, but only abnormal results are displayed)  Labs Reviewed  TROPONIN I - Abnormal; Notable for the following:    Troponin I 0.04 (*)    All other components within normal limits  COMPREHENSIVE METABOLIC PANEL - Abnormal; Notable for the following:    Sodium 133 (*)    Glucose, Bld 482 (*)    Creatinine, Ser 1.23 (*)    Calcium 8.2 (*)    Albumin 2.1 (*)    AST 44 (*)    GFR calc  non Af Amer 42 (*)    GFR calc Af Amer 48 (*)    All other components within normal limits  CBC  URINALYSIS COMPLETEWITH MICROSCOPIC ([facility] ONLY)   ____________________________________________  EKG  ED ECG REPORT I, SUNG,JADE J, the attending physician, personally viewed and interpreted this ECG.   Date: 02/21/2015  EKG Time: 2358  Rate: 66  Rhythm: normal EKG, normal sinus rhythm  Axis: LAD  Intervals:left anterior fascicular block  ST&T Change: Nonspecific  ____________________________________________  RADIOLOGY  CT head without contrast interpreted per Dr. Radene Knee: 1. No acute intracranial pathology seen on CT. 2. Mild cortical volume loss noted. ____________________________________________   PROCEDURES  Procedure(s) performed: None  Critical Care performed: Yes, see critical care note(s)  CRITICAL CARE Performed by: Paulette Blanch   Total critical care time: 30 minutes  Critical care time was exclusive of separately billable procedures and treating other patients.  Critical care was necessary to treat or prevent imminent or life-threatening deterioration.  Critical care was time spent personally by me on the following activities: development of treatment plan with patient and/or surrogate as well as nursing, discussions with consultants, evaluation of patient's response to treatment, examination of patient, obtaining history from patient or surrogate, ordering and performing treatments and interventions, ordering and review of laboratory studies, ordering and review of radiographic studies, pulse oximetry and re-evaluation of patient's condition.  ____________________________________________   INITIAL IMPRESSION / ASSESSMENT AND PLAN / ED COURSE  Pertinent labs & imaging results that were available during my care of the patient were reviewed by me and considered in my medical decision making (see chart for details).  74 year old female who presents with  dizziness and active vomiting. Hypertensive on arrival. Will administer IV Valium and Zofran; obtain CT head, labs including troponin and EKG.  ----------------------------------------- 1:26 AM on 02/21/2015 -----------------------------------------  Dizziness and nausea improved. Blood pressure normalized. Updated patient on laboratory abnormalities including elevated troponin and hyperglycemia. Patient denies history of diabetes. Will initiate IV fluid resuscitation and insulin subcutaneous. Will discuss case with hospitalist to evaluate patient in the ED for admission. ____________________________________________   FINAL CLINICAL IMPRESSION(S) / ED DIAGNOSES  Final diagnoses:  Vertigo  Non-intractable vomiting with nausea, vomiting of unspecified type  Elevated troponin  Secondary hypertension, unspecified  Hyperglycemia      Paulette Blanch, MD 02/21/15 [phone]

## 2015-02-21 NOTE — ED Notes (Signed)
Patient transported to CT 

## 2015-02-21 NOTE — ED Notes (Signed)
Pt states continued but improved dizziness.

## 2015-02-21 NOTE — ED Notes (Signed)
Pt states dizziness is worse if she is lying flat or moving head. Pt states dizziness began after pt had "incomplete movement of my bowels". Pt denies headache, denies shob, chest pain, denies nausea.

## 2015-02-21 NOTE — Progress Notes (Addendum)
[place] [facility] [place] NAME: Lisa Lowery    MR#:  [id number]  DATE OF BIRTH:  [birth year]  SUBJECTIVE:  CHIEF COMPLAINT:   Chief Complaint  Patient presents with  . Dizziness   patient presented to emergency room with vertigo and was noted to have markedly elevated blood pressure to 709G systolic. She was given meclizine and value after which her symptoms somewhat subsided, although patient admits of having intermittent dizziness whenever she moves around. She also complains of lower extremity swelling, especially left lower extremity recently. Has been moving to [place] to live with her son and the is engaged in loss of moving related stresses.   Review of Systems  Constitutional: Negative for fever, chills and weight loss.  HENT: Negative for congestion.   Eyes: Negative for blurred vision and double vision.  Respiratory: Negative for cough, sputum production, shortness of breath and wheezing.   Cardiovascular: Positive for leg swelling. Negative for chest pain, palpitations, orthopnea and PND.  Gastrointestinal: Negative for nausea, vomiting, abdominal pain, diarrhea, constipation and blood in stool.  Genitourinary: Negative for dysuria, urgency, frequency and hematuria.  Musculoskeletal: Negative for falls.  Neurological: Positive for dizziness. Negative for tremors, focal weakness and headaches.  Endo/Heme/Allergies: Does not bruise/bleed easily.  Psychiatric/Behavioral: Negative for depression. The patient does not have insomnia.     VITAL SIGNS: Blood pressure 137/67, pulse 81, temperature 97.7 F (36.5 C), temperature source Oral, resp. rate 18, height 5\' 7"  (1.702 m), weight 87.635 kg (193 lb 3.2 oz), SpO2 97 %.  PHYSICAL EXAMINATION:   GENERAL:  74 y.o.-year-old patient lying in the bed with no acute distress.  EYES: Pupils equal, round, reactive to light and accommodation. No scleral icterus. Extraocular muscles  intact.  HEENT: Head atraumatic, normocephalic. Oropharynx and nasopharynx clear.  NECK:  Supple, no jugular venous distention. No thyroid enlargement, no tenderness.  LUNGS: Normal breath sounds bilaterally, no wheezing, rales,rhonchi or crepitation. No use of accessory muscles of respiration.  CARDIOVASCULAR: S1, S2 normal. No murmurs, rubs, or gallops.  ABDOMEN: Soft, nontender, nondistended. Bowel sounds present. No organomegaly or mass.  EXTREMITIES: No pedal edema, cyanosis, or clubbing.  NEUROLOGIC: Cranial nerves II through XII are intact. Muscle strength 5/5 in all extremities. Sensation intact. Gait not checked.  PSYCHIATRIC: The patient is alert and oriented x 3. Extremities somewhat swollen left lower extremity more than the right lower extremity but only 1+ several, calve tenderness was noted, as well as superficial vein enlargement and nodularity. Peripheral pulses are well palpable SKIN: No obvious rash, lesion, or ulcer.   ORDERS/RESULTS REVIEWED:   CBC  Recent Labs Lab 02/21/15 0032  WBC 7.2  HGB 14.4  HCT 42.5  PLT 184  MCV 97.5  MCH 33.0  MCHC 33.9  RDW 13.5   ------------------------------------------------------------------------------------------------------------------  Chemistries   Recent Labs Lab 02/21/15 0032 02/21/15 0535  NA 133* 135  K 3.6 3.8  CL 102 106  CO2 25 24  GLUCOSE 482* 367*  BUN 15 15  CREATININE 1.23* 1.11*  CALCIUM 8.2* 7.8*  AST 44*  --   ALT 24  --   ALKPHOS 82  --   BILITOT 0.6  --    ------------------------------------------------------------------------------------------------------------------ estimated creatinine clearance is 49.8 mL/min (by C-G formula based on Cr of 1.11). ------------------------------------------------------------------------------------------------------------------ No results for input(s): TSH, T4TOTAL, T3FREE, THYROIDAB in the last 72 hours.  Invalid input(s): FREET3  Cardiac  Enzymes  Recent Labs Lab 02/21/15 0032 02/21/15 0535  TROPONINI 0.04* 0.07*   ------------------------------------------------------------------------------------------------------------------ Invalid input(s): POCBNP ---------------------------------------------------------------------------------------------------------------  RADIOLOGY: Ct Head Wo Contrast  02/21/2015   CLINICAL DATA:  Acute onset of dizziness during bowel movement. Initial encounter.  EXAM: CT HEAD WITHOUT CONTRAST  TECHNIQUE: Contiguous axial images were obtained from the base of the skull through the vertex without intravenous contrast.  COMPARISON:  None.  FINDINGS: There is no evidence of acute infarction, mass lesion, or intra- or extra-axial hemorrhage on CT.  Prominence of the sulci suggests mild cortical volume loss. Mild cerebellar atrophy is noted.  The posterior fossa, including the cerebellum, brainstem and fourth ventricle, is within normal limits. The third and lateral ventricles, and basal ganglia are unremarkable in appearance. The cerebral hemispheres are symmetric in appearance, with normal gray-white differentiation. No mass effect or midline shift is seen.  There is no evidence of fracture; visualized osseous structures are unremarkable in appearance. The visualized portions of the orbits are within normal limits. The paranasal sinuses and mastoid air cells are well-aerated. No significant soft tissue abnormalities are seen.  IMPRESSION: 1. No acute intracranial pathology seen on CT. 2. Mild cortical volume loss noted.   Electronically Signed   By: Garald Balding M.D.   On: 02/21/2015 01:10    EKG:  Orders placed or performed during the hospital encounter of 02/20/15  . EKG 12-Lead  . EKG 12-Lead    ASSESSMENT AND PLAN:  Principal Problem:   Dizziness Active Problems:   Elevated troponin   HTN (hypertension)   Elevated blood sugar 1. Dizziness, vertigo, likely with benign positional vertigo as  the patient's vertigo exacerbates with movements, cannot rule out hypertensive encephalopathy as well as hypoxia related dizziness, continue medicine change dosing to 3 times daily around the clock, add steroids taper, follow clinically 2. Malignant essential hypertension. Continue metoprolol, patient's blood pressure seemed to be much better control today 3. Renal insufficiency, likely due to his long-standing hypertension, patient admits not going to primary care physician's office for years, she was advised to establish primary care physician whenever she moves with her son and make decisions about getting a nephrology consultation  as outpatient 4. Lower extremity swelling. Get Doppler ultrasound to rule out DVT due to patient's prolonged immobilization related to moving 5. Elevated troponin. Get cardiologist involved. Continue patient on metoprolol as well as aspirin. Add Lipitor for LDL more than 140. Get echocardiogram 6. Hypoxia, unclear etiology, get chest x-ray done 7. Hyperglycemia. Suspected diabetes mellitus. Get diabetic education people to come and get hemoglobin A1c and start patient on insulin sliding scale 8. Hyperlipidemia with LDL 141, initiate patient on Lipitor,  Management plans discussed with the patient, family and they are in agreement.   DRUG ALLERGIES: No Known Allergies  CODE STATUS: Full code    Code Status Orders        Start     Ordered   02/21/15 0459  Full code   Continuous     02/21/15 0458      TOTAL TIME TAKING CARE OF THIS PATIENT: 45 minutes.    Theodoro Grist M.D on 02/21/2015 at 9:23 AM  Between 7am to 6pm - Pager - [phone]  After 6pm go to [email or web address] - password [email or web address] [facility]  [facility]  Office  [phone]  CC: Primary care physician; No PCP Per Patient

## 2015-02-21 NOTE — ED Notes (Signed)
Critical troponin of 0.04 called from lab. Dr. Beather Arbour notified.

## 2015-02-21 NOTE — Progress Notes (Signed)
Pt. Arrived to the unit via stretcher, patient oriented to room, call bell system, ascoms, bed alarms, fall risk sign, how to call meals, general room orientation, 2LNC in place,   Patients is in clean, dry and intact except for sun damage to BUE/BLE and spider veins to left lower ankle/foot. 2+ edema to BLE. Front upper teeth missing right side of mouth. Verifying nurse Myriam Jacobson, RN

## 2015-02-22 LAB — BASIC METABOLIC PANEL
Anion gap: 5 (ref 5–15)
BUN: 22 mg/dL — AB (ref 6–20)
CO2: 25 mmol/L (ref 22–32)
CREATININE: 1.1 mg/dL — AB (ref 0.44–1.00)
Calcium: 7.4 mg/dL — ABNORMAL LOW (ref 8.9–10.3)
Chloride: 106 mmol/L (ref 101–111)
GFR calc non Af Amer: 48 mL/min — ABNORMAL LOW (ref >60)
GFR, EST AFRICAN AMERICAN: 55 mL/min — AB (ref >60)
GLUCOSE: 362 mg/dL — AB (ref 65–99)
Potassium: 4.4 mmol/L (ref 3.5–5.1)
SODIUM: 136 mmol/L (ref 135–145)

## 2015-02-22 LAB — GLUCOSE, CAPILLARY
Glucose-Capillary: 341 mg/dL — ABNORMAL HIGH (ref 65–99)
Glucose-Capillary: 354 mg/dL — ABNORMAL HIGH (ref 65–99)
Glucose-Capillary: 155 mg/dL — ABNORMAL HIGH (ref 65–99)
Glucose-Capillary: 298 mg/dL — ABNORMAL HIGH (ref 65–99)

## 2015-02-22 LAB — HEMOGLOBIN A1C: Hgb A1c MFr Bld: 15 % — ABNORMAL HIGH (ref 4.0–6.0)

## 2015-02-22 MED ORDER — INSULIN ASPART 100 UNIT/ML ~~LOC~~ SOLN
0.0000 [IU] | Freq: Three times a day (TID) | SUBCUTANEOUS | Status: DC
  Administered 2015-02-22: 5 [IU] via SUBCUTANEOUS
  Administered 2015-02-23: 9 [IU] via SUBCUTANEOUS
  Administered 2015-02-23: 5 [IU] via SUBCUTANEOUS
  Filled 2015-02-22 (×2): qty 5
  Filled 2015-02-22: qty 9

## 2015-02-22 MED ORDER — INSULIN GLARGINE 100 UNIT/ML ~~LOC~~ SOLN
20.0000 [IU] | Freq: Every day | SUBCUTANEOUS | Status: DC
  Administered 2015-02-22: 20 [IU] via SUBCUTANEOUS
  Filled 2015-02-22 (×3): qty 0.2

## 2015-02-22 MED ORDER — INSULIN STARTER KIT- SYRINGES (ENGLISH)
1.0000 | Freq: Once | Status: AC
  Administered 2015-02-22: 1
  Filled 2015-02-22: qty 1

## 2015-02-22 MED ORDER — LIVING WELL WITH DIABETES BOOK
Freq: Once | Status: AC
  Administered 2015-02-22: 12:00:00
  Filled 2015-02-22: qty 1

## 2015-02-22 MED ORDER — CEPHALEXIN 500 MG PO CAPS
500.0000 mg | ORAL_CAPSULE | Freq: Two times a day (BID) | ORAL | Status: DC
  Administered 2015-02-22 – 2015-02-23 (×3): 500 mg via ORAL
  Filled 2015-02-22 (×3): qty 1

## 2015-02-22 NOTE — Care Management (Signed)
Patient presents from her son's home.  Prior to this episode of illness, she was independent in all adls.  She moved to [place] from [place].  It is her plan to remain with her son Amaura Authier.  Octavia Bruckner has his house on the market for sale and does not know if he will move within the county or to [place] per patient.  She does not have a PCP in the county.  Discussed if she is to have home health, will need a local PCP.  She has [employer].  Discussed the need to apply for [place] medicaid trough DSS.  Patient is a newly diagnosed diabetic requiring insulin. Physical therapy has recommended home health physical therapy

## 2015-02-22 NOTE — Progress Notes (Signed)
Patient alert and oriented x4, no complaints at this time. vss at this time. Patient SB on telemetry. Will continue to assess.  Diabetic education completed, pt demonstrated understanding and correct technique of insulin administration. Pt has no questions at this time, will cont to assess understanding and cont diabetic teaching until d/c. Wilnette Kales

## 2015-02-22 NOTE — Progress Notes (Addendum)
[facility] NAME: Lisa Lowery    MR#:  [id number]  DATE OF BIRTH:  [birth year]  SUBJECTIVE:  CHIEF COMPLAINT:   Chief Complaint  Patient presents with  . Dizziness   patient presented to emergency room with vertigo and was noted to have markedly elevated blood pressure to 237S systolic. She was given meclizine and Valium after which her symptoms somewhat subsided, although patient admited of having intermittent dizziness whenever she moved around. She also complained of lower extremity swelling, especially left lower extremity recently. Has she is in the process of moving to [place] to live with her son and the is engaged in lots of moving related stresses. Feels better today with steroids given yesterday. Her dizziness seemed to be subsiding. Complains of diarrhea yesterday, very watery. Patient was noted to have a high glucose levels at 307 above and her hemoglobin A1c came back at 15.0, signifying the diabetes. Patient will be started on insulin Lantus, agreeable. She is also interested in weight loss, dietary is consulted  Review of Systems  Constitutional: Negative for fever, chills and weight loss.  HENT: Negative for congestion.   Eyes: Negative for blurred vision and double vision.  Respiratory: Negative for cough, sputum production, shortness of breath and wheezing.   Cardiovascular: Positive for leg swelling. Negative for chest pain, palpitations, orthopnea and PND.  Gastrointestinal: Positive for diarrhea. Negative for nausea, vomiting, abdominal pain, constipation and blood in stool.  Genitourinary: Negative for dysuria, urgency, frequency and hematuria.  Musculoskeletal: Negative for falls.  Neurological: Positive for dizziness. Negative for tremors, focal weakness and headaches.  Endo/Heme/Allergies: Does not bruise/bleed easily.  Psychiatric/Behavioral: Negative for depression. The patient does not have  insomnia.     VITAL SIGNS: Blood pressure 138/60, pulse 75, temperature 98.1 F (36.7 C), temperature source Oral, resp. rate 20, height 5\' 7"  (1.702 m), weight 87.68 kg (193 lb 4.8 oz), SpO2 91 %.  PHYSICAL EXAMINATION:   GENERAL:  74 y.o.-year-old patient lying in the bed with no acute distress.  EYES: Pupils equal, round, reactive to light and accommodation. No scleral icterus. Extraocular muscles intact.  HEENT: Head atraumatic, normocephalic. Oropharynx and nasopharynx clear.  NECK:  Supple, no jugular venous distention. No thyroid enlargement, no tenderness.  LUNGS: Normal breath sounds bilaterally, no wheezing, rales,rhonchi or crepitation. No use of accessory muscles of respiration.  CARDIOVASCULAR: S1, S2 normal. No murmurs, rubs, or gallops.  ABDOMEN: Soft, nontender, nondistended. Bowel sounds present. No organomegaly or mass.  EXTREMITIES: No pedal edema, cyanosis, or clubbing.  NEUROLOGIC: Cranial nerves II through XII are intact. Muscle strength 5/5 in all extremities. Sensation intact. Gait not checked.  PSYCHIATRIC: The patient is alert and oriented x 3. Extremities somewhat swollen left lower extremity more than the right lower extremity but only 1+ several, calve tenderness was noted, as well as superficial vein enlargement and nodularity. Peripheral pulses are well palpable SKIN: No obvious rash, lesion, or ulcer.   ORDERS/RESULTS REVIEWED:   CBC  Recent Labs Lab 02/21/15 0032  WBC 7.2  HGB 14.4  HCT 42.5  PLT 184  MCV 97.5  MCH 33.0  MCHC 33.9  RDW 13.5   ------------------------------------------------------------------------------------------------------------------  Chemistries   Recent Labs Lab 02/21/15 0032 02/21/15 0535 02/22/15 0732  NA 133* 135 136  K 3.6 3.8 4.4  CL 102 106 106  CO2 25 24 25   GLUCOSE 482* 367* 362*  BUN 15 15 22*  CREATININE 1.23* 1.11* 1.10*  CALCIUM 8.2* 7.8* 7.4*  AST 44*  --   --   ALT 24  --   --   ALKPHOS 82   --   --   BILITOT 0.6  --   --    ------------------------------------------------------------------------------------------------------------------ estimated creatinine clearance is 50.2 mL/min (by C-G formula based on Cr of 1.1). ------------------------------------------------------------------------------------------------------------------ No results for input(s): TSH, T4TOTAL, T3FREE, THYROIDAB in the last 72 hours.  Invalid input(s): FREET3  Cardiac Enzymes  Recent Labs Lab 02/21/15 0032 02/21/15 0535 02/21/15 1102  TROPONINI 0.04* 0.07* 0.07*   ------------------------------------------------------------------------------------------------------------------ Invalid input(s): POCBNP ---------------------------------------------------------------------------------------------------------------  RADIOLOGY: Dg Chest 2 View  02/21/2015   CLINICAL DATA:  Vertigo and hypertension.  EXAM: CHEST  2 VIEW  COMPARISON:  None.  FINDINGS: Upper limits normal heart size noted.  There is no evidence of focal airspace disease, pulmonary edema, suspicious pulmonary nodule/mass, pleural effusion, or pneumothorax. No acute bony abnormalities are identified.  IMPRESSION: No active cardiopulmonary disease.   Electronically Signed   By: Margarette Canada M.D.   On: 02/21/2015 11:34   Ct Head Wo Contrast  02/21/2015   CLINICAL DATA:  Acute onset of dizziness during bowel movement. Initial encounter.  EXAM: CT HEAD WITHOUT CONTRAST  TECHNIQUE: Contiguous axial images were obtained from the base of the skull through the vertex without intravenous contrast.  COMPARISON:  None.  FINDINGS: There is no evidence of acute infarction, mass lesion, or intra- or extra-axial hemorrhage on CT.  Prominence of the sulci suggests mild cortical volume loss. Mild cerebellar atrophy is noted.  The posterior fossa, including the cerebellum, brainstem and fourth ventricle, is within normal limits. The third and lateral ventricles,  and basal ganglia are unremarkable in appearance. The cerebral hemispheres are symmetric in appearance, with normal gray-white differentiation. No mass effect or midline shift is seen.  There is no evidence of fracture; visualized osseous structures are unremarkable in appearance. The visualized portions of the orbits are within normal limits. The paranasal sinuses and mastoid air cells are well-aerated. No significant soft tissue abnormalities are seen.  IMPRESSION: 1. No acute intracranial pathology seen on CT. 2. Mild cortical volume loss noted.   Electronically Signed   By: Garald Balding M.D.   On: 02/21/2015 01:10   US Venous Img Lower Bilateral  02/21/2015   CLINICAL DATA:  Lower extremity edema  EXAM: BILATERAL LOWER EXTREMITY VENOUS DUPLEX ULTRASOUND  TECHNIQUE: Doppler venous assessment of the bilateral lower extremity deep venous system was performed, including characterization of spectral flow, compressibility, and phasicity.  COMPARISON:  None.  FINDINGS: There is complete compressibility of the bilateral common femoral, femoral, and popliteal veins. Doppler analysis demonstrates respiratory phasicity and augmentation of flow upon calf compression.  IMPRESSION: No evidence of DVT.   Electronically Signed   By: Marybelle Killings M.D.   On: 02/21/2015 10:20    EKG:  Orders placed or performed during the hospital encounter of 02/20/15  . EKG 12-Lead  . EKG 12-Lead    ASSESSMENT AND PLAN:  Principal Problem:   Dizziness Active Problems:   Elevated troponin   HTN (hypertension)   Elevated blood sugar 1. Dizziness, vertigo, likely with benign positional vertigo, improved on steroids, Valium, meclizine. Will discharge home with home health physical therapy as well as RN, when ready to be discharged 2. Malignant essential hypertension. Continue metoprolol, patient's blood pressure is  better control , may add little dose of ACE inhibitor if blood pressure tolerates 3. Renal insufficiency,  likely due to his long-standing hypertension,  patient admits not going to primary care physician's office for years, she was advised to establish primary care physician whenever she moves with her son and make decisions about getting a nephrology consultation  as outpatient, stable at present, may need to be watched closer if patient is started on ACE inhibitor 4. Lower extremity swelling. Doppler ultrasound showed no DVT 5. Elevated troponin. Patient cardiologist input stress study is recommended as outpatient, echocardiogram was performed and it was unremarkable. Continue patient on metoprolol as well as aspirin, Lipitor for LDL more than 140.  6. Hypoxia, unclear etiology, chest x-ray is normal 7. New diagnosis of severe diabetes mellitus, hemoglobin A1c 15.0. Getting diabetic education, dietary recommendations, patient is interested in weight loss 8. Hyperlipidemia with LDL 141, initiate patient on Lipitor,  9 . Pyuria. Questionable UTI. Get urine cultures and start patient on Keflex orally Management plans discussed with the patient, family and they are in agreement.   DRUG ALLERGIES: No Known Allergies  CODE STATUS: Full code    Code Status Orders        Start     Ordered   02/21/15 0459  Full code   Continuous     02/21/15 0458      TOTAL TIME TAKING CARE OF THIS PATIENT: 55minutes.    Theodoro Grist M.D on 02/22/2015 at 11:27 AM  Between 7am to 6pm - Pager - [phone]  After 6pm go to [email or web address] - password [email or web address] [facility]  [facility]  Office  [phone]  CC: Primary care physician; No PCP Per Patient

## 2015-02-22 NOTE — Progress Notes (Signed)
Spoke with patient about new diabetes diagnosis. Patient reports that she had her blood work checked by her PCP in [place] about a year ago and she was told that she had prediabetes. Discussed A1C results (15.0% on 02/21/15) and explained what an A1C is, basic pathophysiology of DM Type 2, basic home care, importance of checking CBGs and maintaining good CBG control to prevent long-term and short-term complications. Reviewed signs and symptoms of hyperglycemia and hypoglycemia along with treatment for both. Discussed impact of nutrition, exercise, stress, sickness, and medications on diabetes control. Briefly discussed carbohydrates, carbohydrate goals per day and meal, and how to read nutrition labels. Have placed order for RD consult for nutrition education. Reviewed Living Well with Diabetes and insulin starter kit with patient.  Discussed insulins (Lantus and Novolog) and administration with insulin pen versus insulin vial/syringe. Educated patient on both insulin pen and vial/syringe technique. Reviewed contents of insulin starter kit. Reviewed all steps of insulin administratio with both insulin pen and vial/syringe including attachment of needle, 2-unit air shot, dialing up dose, giving injection, removing needle, drawing up insulin from vial, disposal of sharps, storage of unused insulin, disposal of insulin etc. Patient able to provide successful return demonstration of both methods and prefers to use vial/syringe at this time for insulin injections.  Patient states that she has [employer] [place] [employer] [facility] [employer] and her prescriptions usually cost her $3.00 each.  Patient verbalized understanding of information discussed and she states that she has no further questions at this time related to diabetes.  Talked with Nann, RN, CM and asked if she could be sure that patient's [facility] will cover medications prescribed and filled here in [place]. Nann, RN, CM unsure and  will have to find out if [facility] Medicaid will cover prescriptions written and filled now in [place]. RNs to provide ongoing basic DM education at bedside with this patient and engage patient to actively check blood glucose and administer insulin injections.   At time of discharge, patient will need prescriptions for glucometer, testing supplies, insulin syringes, and insulin(s).  Thanks, Barnie Alderman, RN, MSN, CCRN, CDE Diabetes Coordinator Inpatient Diabetes Program [phone] (Team Pager) [phone] ([place] office) [phone] [facility] office) [phone] [facility] office)

## 2015-02-22 NOTE — Progress Notes (Signed)
Pt's capillary glucose is 298, Pt does not have bed time coverage. MD Jannifer Franklin notified. Dr. To put in orders for coverage.

## 2015-02-22 NOTE — Care Management (Addendum)
Spoke with diabetes coordinator Barnie Alderman.  It is anticipated that patient will discharge home on insulin.  Contacted patient insurance [employer].  Lantus insulin will not require prior auth.  If patient discharged home on Novolog- this med would require prior auth but Humalog would not.  Have made multiple attempts to find out copay information on both  but unsuccessful.  There are technical issues with [facility] phone lines  Patient will be using [place]

## 2015-02-22 NOTE — Progress Notes (Signed)
Inpatient Diabetes Program Recommendations  AACE/ADA: New Consensus Statement on Inpatient Glycemic Control (2013)  Target Ranges:  Prepandial:   less than 140 mg/dL      Peak postprandial:   less than 180 mg/dL (1-2 hours)      Critically ill patients:  140 - 180 mg/dL   Results for CEAIRA, ERNSTER (MRN [id number]) as of 02/22/2015 08:04  Ref. Range 02/21/2015 08:11 02/21/2015 11:28 02/21/2015 16:10 02/21/2015 21:00 02/22/2015 07:26  Glucose-Capillary Latest Ref Range: 65-99 mg/dL 308 (H) 319 (H) 254 (H) 355 (H) 341 (H)    Diabetes history: No Outpatient Diabetes medications: NA Current orders for Inpatient glycemic control: Novolog 0-9 units TID with meals  Inpatient Diabetes Program Recommendations Insulin - Basal: Please consider ordering Lantus 18 units daily starting now( based on 87.6 kg x 0.2 units). Correction (SSI): Please increase Novolog correction to resistant scale and ADD Novolog bedtime correction scale. Insulin - Meal Coverage: While inpatient and ordered steroids, please consider ordering Novolog 4 units TID with meals for meal coverage (in addition to Novolog correction scale). HgbA1C: A1C 15% on 02/21/15.  Note: NURSING: RNs to provide ongoing basic DM education at bedside with this patient and engage patient to actively check blood glucose and administer insulin injections. Have ordered educational booklet, insulin starter kit, and RD consult.  Barnie Alderman, RN, MSN, CCRN, CDE Diabetes Coordinator Inpatient Diabetes Program [phone] (Team Pager from [place] to [place]) [phone] ([place] office) [phone] [facility] office) [phone] [facility] office)

## 2015-02-22 NOTE — Progress Notes (Signed)
Initial Nutrition Assessment    INTERVENTION:   Education: provided verbal/written education on diabetic diet with pt and friend; discussed sources of carbs, basic carb counting, meal planning and label reading. Pt receptive to education, adhearance likely Meals and Snacks: Cater to patient preferences   NUTRITION DIAGNOSIS:  Food and nutrition related knowledge deficit related to  (DM) as evidenced by  (newly dx DM).   GOAL:  Patient will meet greater than or equal to 90% of their needs   MONITOR:   (Energy Intake, Anthropometrics, Knowledge, Electrolyte/Renal Profile, Digestive System)  REASON FOR ASSESSMENT:  Consult Diet education  ASSESSMENT: Pt admitted with malignant HTN, RI, new dx DM  PMHx:  Past Medical History  Diagnosis Date  . Vertigo   . Hypertension   . Diabetes     Diet Order: heart healthy, carb controlled   Current Nutrition: recorded po intake 100% of meals  Food/Nutrition-Related History: appetite good  Medications: solumedrol, lantus  Electrolyte/Renal Profile and Glucose Profile:   Recent Labs Lab 02/21/15 0032 02/21/15 0535 02/22/15 0732  NA 133* 135 136  K 3.6 3.8 4.4  CL 102 106 106  CO2 25 24 25   BUN 15 15 22*  CREATININE 1.23* 1.11* 1.10*  CALCIUM 8.2* 7.8* 7.4*  GLUCOSE 482* 367* 362*   Protein Profile:  Recent Labs Lab 02/21/15 0032  ALBUMIN 2.1*   Lab Results  Component Value Date   HGBA1C 15.0* 02/21/2015    Weight Change: pt reports weight has been stable Anthropometrics:  Height:  Ht Readings from Last 1 Encounters:  02/21/15 5\' 7"  (1.702 m)    Weight:  Wt Readings from Last 1 Encounters:  02/22/15 193 lb 4.8 oz (87.68 kg)    Ideal Body Weight:     Wt Readings from Last 10 Encounters:  02/22/15 193 lb 4.8 oz (87.68 kg)    BMI:  Body mass index is 30.27 kg/(m^2).  Diet Order:  Diet heart healthy/carb modified Room service appropriate?: Yes; Fluid consistency:: Thin  LOW Care  Level  Kerman Passey MS, RD, LDN [phone] Pager

## 2015-02-23 DIAGNOSIS — N289 Disorder of kidney and ureter, unspecified: Secondary | ICD-10-CM

## 2015-02-23 DIAGNOSIS — I10 Essential (primary) hypertension: Secondary | ICD-10-CM

## 2015-02-23 DIAGNOSIS — E1110 Type 2 diabetes mellitus with ketoacidosis without coma: Secondary | ICD-10-CM

## 2015-02-23 LAB — GLUCOSE, CAPILLARY
GLUCOSE-CAPILLARY: 291 mg/dL — AB (ref 65–99)
GLUCOSE-CAPILLARY: 383 mg/dL — AB (ref 65–99)
Glucose-Capillary: 276 mg/dL — ABNORMAL HIGH (ref 65–99)

## 2015-02-23 MED ORDER — ONDANSETRON HCL 4 MG PO TABS: 4.0000 mg | ORAL_TABLET | Freq: Four times a day (QID) | ORAL | Status: DC | PRN

## 2015-02-23 MED ORDER — INSULIN DETEMIR 100 UNIT/ML FLEXPEN: 25.0000 [IU] | PEN_INJECTOR | Freq: Every day | SUBCUTANEOUS | Status: DC

## 2015-02-23 MED ORDER — INSULIN LISPRO 100 UNIT/ML ~~LOC~~ SOLN: 4.0000 [IU] | Freq: Three times a day (TID) | SUBCUTANEOUS | Status: DC

## 2015-02-23 MED ORDER — INSULIN GLARGINE 100 UNIT/ML ~~LOC~~ SOLN
30.0000 [IU] | Freq: Every day | SUBCUTANEOUS | Status: DC
Start: 2015-02-24 — End: 2015-02-23
  Filled 2015-02-23: qty 0.3

## 2015-02-23 MED ORDER — ATORVASTATIN CALCIUM 40 MG PO TABS: 40.0000 mg | ORAL_TABLET | Freq: Every day | ORAL | Status: DC

## 2015-02-23 MED ORDER — MECLIZINE HCL 25 MG PO TABS: 25.0000 mg | ORAL_TABLET | Freq: Three times a day (TID) | ORAL | Status: DC

## 2015-02-23 MED ORDER — INSULIN GLARGINE 100 UNIT/ML ~~LOC~~ SOLN
30.0000 [IU] | Freq: Once | SUBCUTANEOUS | Status: AC
  Administered 2015-02-23: 30 [IU] via SUBCUTANEOUS
  Filled 2015-02-23: qty 0.3

## 2015-02-23 MED ORDER — LISINOPRIL 5 MG PO TABS: 5.0000 mg | ORAL_TABLET | Freq: Every day | ORAL | Status: DC

## 2015-02-23 MED ORDER — METHYLPREDNISOLONE 4 MG PO TBPK: ORAL_TABLET | ORAL | Status: DC

## 2015-02-23 MED ORDER — DIAZEPAM 5 MG PO TABS: 5.0000 mg | ORAL_TABLET | Freq: Four times a day (QID) | ORAL | Status: DC | PRN

## 2015-02-23 MED ORDER — INSULIN GLARGINE 100 UNIT/ML ~~LOC~~ SOLN
30.0000 [IU] | Freq: Every morning | SUBCUTANEOUS | Status: DC
  Filled 2015-02-23 (×2): qty 0.3

## 2015-02-23 NOTE — Progress Notes (Signed)
Physical Therapy Treatment Patient Details Name: Lisa Lowery MRN: [id number] DOB: 05/11/1941 Today's Date: 02/23/2015    History of Present Illness Patient presented to emergency room with vertigo and was noted to have markedly elevated blood pressure to 616W systolic. She was given meclizine and value after which her symptoms somewhat subsided, although patient admits of having intermittent dizziness whenever she moves around. She also complains of lower extremity swelling, especially left lower extremity recently. Has been moving to [place] to live with her son and the is engaged in loss of moving related stresses.    PT Comments    Nursing contacted PT d/t pt's son requesting physical therapy teach pt how to use the rollator he bought for pt to use at home (although PT recommending RW) prior to leaving hospital.  Pt and pt's son educated on how to use rollator safely and how to safely use brakes with transfers/mobility; pt demonstrating appropriate safety using rollator (including use of brakes) and with no loss of balance with rollator during session.  Recommend follow-up with HHPT for home safety.  Follow Up Recommendations  Home health PT     Equipment Recommendations  Rolling walker with 5" wheels    Recommendations for Other Services       Precautions / Restrictions Precautions Precautions: Fall    Mobility  Bed Mobility Overal bed mobility: Modified Independent (Supine to sit)                Transfers Overall transfer level: Modified independent Equipment used: 4-wheeled walker             General transfer comment: after initial demo, pt demonstrating appropriate safety locking brakes prior to standing and sitting without vc's  Ambulation/Gait Ambulation/Gait assistance: Supervision Ambulation Distance (Feet): 50 Feet Assistive device: 4-wheeled walker Gait Pattern/deviations: Step-through pattern     General Gait Details: no loss of balance  with turns or ambulation   Stairs            Wheelchair Mobility    Modified Rankin (Stroke Patients Only)       Balance                                    Cognition Arousal/Alertness: Awake/alert Behavior During Therapy: WFL for tasks assessed/performed Overall Cognitive Status: Within Functional Limits for tasks assessed                      Exercises      General Comments  Nursing reports pt already discharged (not in system anymore) but pt's son requesting that physical therapy show pt how to use rollator (that he just bought for pt) prior to volunteer taking pt via w/c for discharge from hospital (pt still in bed in her hospital room with son present).  Per rehab supervisor, ok to see pt and to document reason pt seen (family request for rollator training prior to leaving hospital).      Pertinent Vitals/Pain Pain Assessment: No/denies pain    Home Living                      Prior Function            PT Goals (current goals can now be found in the care plan section) Acute Rehab PT Goals Patient Stated Goal: Learn how to use rollator PT Goal Formulation: With patient/family (pt's son) Time  For Goal Achievement: 03/09/15 Potential to Achieve Goals: Good Progress towards PT goals: Progressing toward goals    Frequency  Min 2X/week    PT Plan Current plan remains appropriate    Co-evaluation             End of Session Equipment Utilized During Treatment: Gait belt Activity Tolerance: Patient tolerated treatment well Patient left:  (in w/c with volunteer present to take pt for hospital discharge)     Time: 1430-1440 PT Time Calculation (min) (ACUTE ONLY): 10 min  Charges:  $Gait Training: 8-22 mins                    G CodesLeitha Bleak 2015/03/18, 2:55 PM [name], [place]

## 2015-02-23 NOTE — Progress Notes (Signed)
Patient d/c'd home with son. Diabetic education provided to patient and son. Verbalized and demonstrated understanding, no questions at this time. Patient to be picked up by son, Octavia Bruckner. Telemetry removed.  Wilnette Kales

## 2015-02-23 NOTE — Discharge Summary (Signed)
[facility] [place] NAME: Lisa Lowery    MR#:  [id number]  DATE OF BIRTH:  1941-02-16  DATE OF ADMISSION:  02/20/2015 ADMITTING PHYSICIAN: Juluis Mire, MD  DATE OF DISCHARGE: No discharge date for patient encounter.  PRIMARY CARE PHYSICIAN: No PCP Per Patient     ADMISSION DIAGNOSIS:  Vertigo [R42] Hyperglycemia [R73.9] Elevated troponin [R79.89] Secondary hypertension, unspecified [I15.9] Non-intractable vomiting with nausea, vomiting of unspecified type [R11.2]  DISCHARGE DIAGNOSIS:  Principal Problem:   Dizziness Active Problems:   HTN (hypertension)   Essential hypertension, malignant   Elevated troponin   Elevated blood sugar   Renal insufficiency   DM (diabetes mellitus) type 2, uncontrolled, with ketoacidosis   SECONDARY DIAGNOSIS:   Past Medical History  Diagnosis Date  . Vertigo   . Hypertension   . Diabetes     .pro HOSPITAL COURSE:   Patient is a 74 year old Caucasian female with no significant past medical history who presents to the hospital with complaints of vertigo,  dizziness. Patient was noted to have mildly elevated troponin and the renal insufficiency. She was also hyponatremic and severity hyperglycemic with blood glucose level close to 400. Hemoglobin A1c was checked was found to be 15.0 signifying severe diabetes mellitus type 2. Patient was initiated on Meclizine, IV fluids,  Steroid taper for vertigo . She was started on insulin therapy for diabetes and diabetic education was initiated . For mildly elevated troponin cardiologist Dr. Rayann Heman was consulted , who recommended stress test as an outpatient with no further investigation in the hospital . Echocardiogram was performed which revealed normal heart function .  Discussion by problem  1. Dizziness, vertigo, likely benign positional vertigo, improved on steroids, Valium, meclizine, continued them as needed as an outpatient and taper  steroids. Discharge patient home with home health physical therapy as well as RN today 2. Malignant essential hypertension. Continue metoprolol, lisinopril , following patient's kidney function closely as an outpatient 3. Renal insufficiency, likely due to his long-standing hypertension, patient admits not going to primary care physician's office for years, she was advised to establish primary care physician whenever she moves in with her son and make decisions about getting a nephrology consultation as outpatient, stable at present, will need to be watched closer as patient is started on ACE inhibitor 4. Lower extremity swelling. Doppler ultrasound showed no DVT 5. Elevated troponin. Patient cardiologist recommended stress study as outpatient, echocardiogram was performed and it was unremarkable. Continue patient on metoprolol as well as aspirin at home, also Lipitor for LDL more than 140.  6. Hypoxia, unclear etiology, chest x-ray was normal.resolved 7. New diagnosis of diabetes mellitus, type 2 , hemoglobin A1c 15.0. Getting diabetic education, dietary recommendations, patient is interested in weight loss, patient's son came in to learn more about diabetes management 8. Hyperlipidemia with LDL 141, initiate patient on Lipitor,  9 . Pyuria. No UTI as  urine cultures are negative we will discontinue antibiotic  Discharge condition is fair   CONSULTS OBTAINED:  Treatment Team:  Thompson Grayer, MD  DRUG ALLERGIES:  No Known Allergies  DISCHARGE MEDICATIONS:   Current Discharge Medication List    START taking these medications   Details  atorvastatin (LIPITOR) 40 MG tablet Take 1 tablet (40 mg total) by mouth daily at 6 PM. Qty: 30 tablet, Refills: 2    diazepam (VALIUM) 5 MG tablet Take 1 tablet (5 mg total) by mouth every 6 (six) hours as needed (dizziness).  Qty: 30 tablet, Refills: 0    Insulin Detemir (LEVEMIR FLEXTOUCH) 100 UNIT/ML Pen Inject 25 Units into the skin daily at 10  pm. Qty: 15 mL, Refills: 11    insulin lispro (HUMALOG) 100 UNIT/ML injection Inject 0.04 mLs (4 Units total) into the skin 3 (three) times daily with meals. Qty: 10 mL, Refills: 11    lisinopril (PRINIVIL,ZESTRIL) 5 MG tablet Take 1 tablet (5 mg total) by mouth daily. Qty: 30 tablet, Refills: 3    meclizine (ANTIVERT) 25 MG tablet Take 1 tablet (25 mg total) by mouth 3 (three) times daily. Qty: 30 tablet, Refills: 0    methylPREDNISolone (MEDROL DOSEPAK) 4 MG TBPK tablet follow package directions Qty: 21 tablet, Refills: 0    ondansetron (ZOFRAN) 4 MG tablet Take 1 tablet (4 mg total) by mouth every 6 (six) hours as needed for nausea. Qty: 20 tablet, Refills: 0         ESTABLISH AND FOLLOW UP WITH PRIMARY CARE PHYSICIAN , CONTINUE DIABETIC EDUCATION WITH lifestyle OUTPATIENT CLINIC, lose weight if possible to better control your diabetes   of your admission symptoms, develop shortness of breath, life threatening emergency, suicidal or homicidal thoughts you must seek medical attention immediately by calling 911 or calling your MD immediately  if symptoms less severe.  You Must read complete instructions/literature along with all the possible adverse reactions/side effects for all the Medicines you take and that have been prescribed to you. Take any new Medicines after you have completely understood and accept all the possible adverse reactions/side effects.   Please note  You were cared for by a hospitalist during your hospital stay. If you have any questions about your discharge medications or the care you received while you were in the hospital after you are discharged, you can call the unit and asked to speak with the hospitalist on call if the hospitalist that took care of you is not available. Once you are discharged, your primary care physician will handle any further medical issues. Please note that NO REFILLS for any discharge medications will be authorized once you are  discharged, as it is imperative that you return to your primary care physician (or establish a relationship with a primary care physician if you do not have one) for your aftercare needs so that they can reassess your need for medications and monitor your lab values.    Today   CHIEF COMPLAINT:   Chief Complaint  Patient presents with  . Dizziness    HISTORY OF PRESENT ILLNESS:  Lisa Lowery  is a 74 y.o. female with no significant past medical history who presents to the hospital with complaints of vertigo,  dizziness. Patient was noted to have mildly elevated troponin and the renal insufficiency. She was also hyponatremic and severity hyperglycemic with blood glucose level close to 400. Hemoglobin A1c was checked was found to be 15.0 signifying severe diabetes mellitus type 2. Patient was initiated on Meclizine, IV fluids,  Steroid taper for vertigo . She was started on insulin therapy for diabetes and diabetic education was initiated . For mildly elevated troponin cardiologist Dr. Rayann Heman was consulted , who recommended stress test as an outpatient with no further investigation in the hospital . Echocardiogram was performed which revealed normal heart function .  Discussion by problem  1. Dizziness, vertigo, likely benign positional vertigo, improved on steroids, Valium, meclizine, continued them as needed as an outpatient and taper steroids. Discharge patient home with home health physical therapy as well as  RN today 2. Malignant essential hypertension. Continue metoprolol, lisinopril , following patient's kidney function closely as an outpatient 3. Renal insufficiency, likely due to his long-standing hypertension, patient admits not going to primary care physician's office for years, she was advised to establish primary care physician whenever she moves in with her son and make decisions about getting a nephrology consultation as outpatient, stable at present, will need to be watched closer as  patient is started on ACE inhibitor 4. Lower extremity swelling. Doppler ultrasound showed no DVT 5. Elevated troponin. Patient cardiologist recommended stress study as outpatient, echocardiogram was performed and it was unremarkable. Continue patient on metoprolol as well as aspirin at home, also Lipitor for LDL more than 140.  6. Hypoxia, unclear etiology, chest x-ray was normal.resolved 7. New diagnosis of diabetes mellitus, type 2 , hemoglobin A1c 15.0. Getting diabetic education, dietary recommendations, patient is interested in weight loss, patient's son came in to learn more about diabetes management 8. Hyperlipidemia with LDL 141, initiate patient on Lipitor,  9 . Pyuria. No UTI as  urine cultures are negative we will discontinue antibiotic  Discharge condition is fair     VITAL SIGNS:  Blood pressure 137/52, pulse 69, temperature 98.1 F (36.7 C), temperature source Oral, resp. rate 18, height 5\' 7"  (1.702 m), weight 86.773 kg (191 lb 4.8 oz), SpO2 94 %.  I/O:   Intake/Output Summary (Last 24 hours) at 02/23/15 1120 Last data filed at 02/23/15 0819  Gross per 24 hour  Intake    720 ml  Output   1740 ml  Net  -1020 ml    PHYSICAL EXAMINATION:  GENERAL:  74 y.o.-year-old patient lying in the bed with no acute distress.  EYES: Pupils equal, round, reactive to light and accommodation. No scleral icterus. Extraocular muscles intact.  HEENT: Head atraumatic, normocephalic. Oropharynx and nasopharynx clear.  NECK:  Supple, no jugular venous distention. No thyroid enlargement, no tenderness.  LUNGS: Normal breath sounds bilaterally, no wheezing, rales,rhonchi or crepitation. No use of accessory muscles of respiration.  CARDIOVASCULAR: S1, S2 normal. No murmurs, rubs, or gallops.  ABDOMEN: Soft, non-tender, non-distended. Bowel sounds present. No organomegaly or mass.  EXTREMITIES: No pedal edema, cyanosis, or clubbing.  NEUROLOGIC: Cranial nerves II through XII are intact.  Muscle strength 5/5 in all extremities. Sensation intact. Gait not checked.  PSYCHIATRIC: The patient is alert and oriented x 3.  SKIN: No obvious rash, lesion, or ulcer.   DATA REVIEW:   CBC  Recent Labs Lab 02/21/15 0032  WBC 7.2  HGB 14.4  HCT 42.5  PLT 184    Chemistries   Recent Labs Lab 02/21/15 0032  02/22/15 0732  NA 133*  < > 136  K 3.6  < > 4.4  CL 102  < > 106  CO2 25  < > 25  GLUCOSE 482*  < > 362*  BUN 15  < > 22*  CREATININE 1.23*  < > 1.10*  CALCIUM 8.2*  < > 7.4*  AST 44*  --   --   ALT 24  --   --   ALKPHOS 82  --   --   BILITOT 0.6  --   --   < > = values in this interval not displayed.  Cardiac Enzymes  Recent Labs Lab 02/21/15 1102  TROPONINI 0.07*    Microbiology Results  Results for orders placed or performed during the hospital encounter of 02/20/15  Urine culture     Status: None (Preliminary result)  Collection Time: 02/22/15  4:01 PM  Result Value Ref Range Status   Specimen Description URINE, CLEAN CATCH  Final   Special Requests Normal  Final   Culture NO GROWTH <24 HRS  Final   Report Status PENDING  Incomplete    RADIOLOGY:  No results found.  EKG:   Orders placed or performed during the hospital encounter of 02/20/15  . EKG 12-Lead  . EKG 12-Lead      Management plans discussed with the patient, family and they are in agreement.  CODE STATUS:     Code Status Orders        Start     Ordered   02/21/15 0459  Full code   Continuous     02/21/15 0458      TOTAL TIME TAKING CARE OF THIS PATIENT: 45  minutes.   Discussed this patient's son about her condition and treatment plan .  He voiced understanding.  Time spent  additional 10 minutes.   Theodoro Grist M.D on 02/23/2015 at 11:20 AM  Between 7am to 6pm - Pager - [phone]  After 6pm go to [email or web address] - password [email or web address] [facility]  [facility]  Office  [phone]  CC: Primary care physician; No PCP Per Patient

## 2015-02-23 NOTE — Progress Notes (Signed)
PT Cancellation Note  Patient Details Name: Lisa Lowery MRN: [id number] DOB: [birth year]   Cancelled Treatment:    Reason Eval/Treat Not Completed: Patient declined, no reason specified. Patient just had lunch brought to her and requested PT re-attempt after lunch. Visitor with patient requested PT come back after lunch to teach patient how to use RW that was recently purchased.   Kerman Passey, PT, DPT    02/23/2015, 11:40 AM

## 2015-02-23 NOTE — Progress Notes (Signed)
Diabetes history: No Outpatient Diabetes medications: NA Current orders for Inpatient glycemic control: Novolog 0-9 units TID with meals and hs, Lantus 20 units qday  Inpatient Diabetes Program Recommendations   Please increase Novolog correction to resistant scale and ADD Novolog bedtime correction scale.  While inpatient and ordered steroids, please consider ordering Novolog 4 units TID with meals for meal coverage (in addition to Novolog correction scale).  HgbA1C: A1C 15% on 02/21/15.  Note: NURSING: RNs to provide ongoing basic DM education at bedside with this patient and engage patient to actively check blood glucose and administer insulin injections. Living Well with Diabetes educational booklet, insulin starter kit, and RD consult ordered.   Gentry Fitz, RN, BA, MHA, CDE Diabetes Coordinator Inpatient Diabetes Program  [phone] (Team Pager) [phone] ([place]) 02/23/2015 8:25 AM

## 2015-02-23 NOTE — Care Management Note (Signed)
Case Management Note  Patient Details  Name: Lisa Lowery MRN: [id number] Date of Birth: April 12, 1941  Subjective/Objective: Patient has a new patient appointment with Dr. Chancy Milroy. Explained to patient that once she establishes care, he can set up home health services. Patient verbalized understanding. Primary nurse updated.           Action/Plan: Home with self care.  Expected Discharge Date:                  Expected Discharge Plan:     In-House Referral:     Discharge planning Services     Post Acute Care Choice:    Choice offered to:     DME Arranged:    DME Agency:     HH Arranged:    [place] Agency:     Status of Service:     Medicare Important Message Given:  Yes-second notification given Date Medicare IM Given:    Medicare IM give by:    Date Additional Medicare IM Given:    Additional Medicare Important Message give by:     If discussed at [place] of Stay Meetings, dates discussed:    Additional Comments:  Jolly Mango, RN 02/23/2015, 1:35 PM

## 2015-02-23 NOTE — Care Management Important Message (Signed)
Important Message  Patient Details  Name: Lisa Lowery MRN: [id number] Date of Birth: [birth year]   Medicare Important Message Given:  Yes-second notification given    Juliann Pulse A Allmond 02/23/2015, 10:16 AM

## 2015-02-24 LAB — URINE CULTURE: Special Requests: NORMAL

## 2015-09-11 ENCOUNTER — Inpatient Hospital Stay
Admission: EM | Admit: 2015-09-11 | Discharge: 2015-09-13 | DRG: 309 | Disposition: A | Discharge status: Home / Self Care | Payer: Medicare Other | Attending: Specialist | Admitting: Specialist

## 2015-09-11 ENCOUNTER — Encounter: Payer: Self-pay | Admitting: Emergency Medicine

## 2015-09-11 ENCOUNTER — Emergency Department: Payer: Medicare Other

## 2015-09-11 ENCOUNTER — Inpatient Hospital Stay (HOSPITAL_COMMUNITY)
Admit: 2015-09-11 | Discharge: 2015-09-11 | Disposition: A | Discharge status: Home / Self Care | Payer: Medicare Other | Attending: Internal Medicine | Admitting: Internal Medicine

## 2015-09-11 DIAGNOSIS — E119 Type 2 diabetes mellitus without complications: Secondary | ICD-10-CM | POA: Diagnosis present

## 2015-09-11 DIAGNOSIS — I248 Other forms of acute ischemic heart disease: Secondary | ICD-10-CM | POA: Diagnosis present

## 2015-09-11 DIAGNOSIS — J90 Pleural effusion, not elsewhere classified: Secondary | ICD-10-CM | POA: Diagnosis present

## 2015-09-11 DIAGNOSIS — I214 Non-ST elevation (NSTEMI) myocardial infarction: Secondary | ICD-10-CM

## 2015-09-11 DIAGNOSIS — R112 Nausea with vomiting, unspecified: Secondary | ICD-10-CM | POA: Diagnosis present

## 2015-09-11 DIAGNOSIS — Z794 Long term (current) use of insulin: Secondary | ICD-10-CM | POA: Diagnosis not present

## 2015-09-11 DIAGNOSIS — I4891 Unspecified atrial fibrillation: Secondary | ICD-10-CM | POA: Diagnosis present

## 2015-09-11 DIAGNOSIS — Z590 Homelessness: Secondary | ICD-10-CM | POA: Diagnosis not present

## 2015-09-11 DIAGNOSIS — Z79899 Other long term (current) drug therapy: Secondary | ICD-10-CM

## 2015-09-11 DIAGNOSIS — R2681 Unsteadiness on feet: Secondary | ICD-10-CM | POA: Diagnosis present

## 2015-09-11 DIAGNOSIS — I10 Essential (primary) hypertension: Secondary | ICD-10-CM | POA: Diagnosis present

## 2015-09-11 LAB — GLUCOSE, CAPILLARY
Glucose-Capillary: 144 mg/dL — ABNORMAL HIGH (ref 65–99)
Glucose-Capillary: 97 mg/dL (ref 65–99)
Glucose-Capillary: 162 mg/dL — ABNORMAL HIGH (ref 65–99)
Glucose-Capillary: 93 mg/dL (ref 65–99)
Glucose-Capillary: 116 mg/dL — ABNORMAL HIGH (ref 65–99)

## 2015-09-11 LAB — CBC
HCT: 41.9 % (ref 35.0–47.0)
Hemoglobin: 14.2 g/dL (ref 12.0–16.0)
MCH: 31 pg (ref 26.0–34.0)
MCHC: 33.8 g/dL (ref 32.0–36.0)
MCV: 91.8 fL (ref 80.0–100.0)
Platelets: 221 10*3/uL (ref 150–440)
RBC: 4.56 MIL/uL (ref 3.80–5.20)
RDW: 14.3 % (ref 11.5–14.5)
WBC: 8.8 10*3/uL (ref 3.6–11.0)

## 2015-09-11 LAB — BASIC METABOLIC PANEL
Anion gap: 5 (ref 5–15)
BUN: 19 mg/dL (ref 6–20)
CHLORIDE: 112 mmol/L — AB (ref 101–111)
CO2: 24 mmol/L (ref 22–32)
CREATININE: 1.25 mg/dL — AB (ref 0.44–1.00)
Calcium: 8 mg/dL — ABNORMAL LOW (ref 8.9–10.3)
GFR calc Af Amer: 47 mL/min — ABNORMAL LOW (ref >60)
GFR calc non Af Amer: 41 mL/min — ABNORMAL LOW (ref >60)
GLUCOSE: 143 mg/dL — AB (ref 65–99)
Potassium: 3.8 mmol/L (ref 3.5–5.1)
Sodium: 141 mmol/L (ref 135–145)

## 2015-09-11 LAB — HEMOGLOBIN A1C: Hgb A1c MFr Bld: 5.1 % (ref 4.0–6.0)

## 2015-09-11 LAB — TROPONIN I
Troponin I: 0.35 ng/mL — ABNORMAL HIGH
Troponin I: 0.42 ng/mL — ABNORMAL HIGH
Troponin I: 0.43 ng/mL — ABNORMAL HIGH (ref <0.031)
Troponin I: 0.52 ng/mL — ABNORMAL HIGH (ref <0.031)

## 2015-09-11 LAB — TSH: TSH: 7.11 u[IU]/mL — ABNORMAL HIGH (ref 0.350–4.500)

## 2015-09-11 MED ORDER — MORPHINE SULFATE (PF) 2 MG/ML IV SOLN: 2.0000 mg | INTRAVENOUS | Status: DC | PRN

## 2015-09-11 MED ORDER — DOCUSATE SODIUM 100 MG PO CAPS
100.0000 mg | ORAL_CAPSULE | Freq: Two times a day (BID) | ORAL | Status: DC
  Administered 2015-09-11 – 2015-09-13 (×2): 100 mg via ORAL
  Filled 2015-09-11 (×5): qty 1

## 2015-09-11 MED ORDER — INSULIN DETEMIR 100 UNIT/ML ~~LOC~~ SOLN
16.0000 [IU] | Freq: Every day | SUBCUTANEOUS | Status: DC
  Administered 2015-09-11 – 2015-09-12 (×2): 16 [IU] via SUBCUTANEOUS
  Filled 2015-09-11 (×3): qty 0.16

## 2015-09-11 MED ORDER — ONDANSETRON HCL 4 MG/2ML IJ SOLN: 4.0000 mg | Freq: Four times a day (QID) | INTRAMUSCULAR | Status: DC | PRN

## 2015-09-11 MED ORDER — PNEUMOCOCCAL VAC POLYVALENT 25 MCG/0.5ML IJ INJ: 0.5000 mL | INJECTION | INTRAMUSCULAR | Status: DC

## 2015-09-11 MED ORDER — APIXABAN 5 MG PO TABS
5.0000 mg | ORAL_TABLET | Freq: Two times a day (BID) | ORAL | Status: DC
  Administered 2015-09-11 – 2015-09-13 (×4): 5 mg via ORAL
  Filled 2015-09-11 (×4): qty 1

## 2015-09-11 MED ORDER — DILTIAZEM HCL 60 MG PO TABS
60.0000 mg | ORAL_TABLET | Freq: Three times a day (TID) | ORAL | Status: DC
  Administered 2015-09-11 – 2015-09-13 (×8): 60 mg via ORAL
  Filled 2015-09-11 (×8): qty 1

## 2015-09-11 MED ORDER — ONDANSETRON HCL 4 MG PO TABS: 4.0000 mg | ORAL_TABLET | Freq: Four times a day (QID) | ORAL | Status: DC | PRN

## 2015-09-11 MED ORDER — INFLUENZA VAC SPLIT QUAD 0.5 ML IM SUSY: 0.5000 mL | PREFILLED_SYRINGE | INTRAMUSCULAR | Status: DC

## 2015-09-11 MED ORDER — ACETAMINOPHEN 650 MG RE SUPP: 650.0000 mg | Freq: Four times a day (QID) | RECTAL | Status: DC | PRN

## 2015-09-11 MED ORDER — ATORVASTATIN CALCIUM 20 MG PO TABS
40.0000 mg | ORAL_TABLET | Freq: Every day | ORAL | Status: DC
  Administered 2015-09-11 – 2015-09-12 (×2): 40 mg via ORAL
  Filled 2015-09-11 (×2): qty 2

## 2015-09-11 MED ORDER — SODIUM CHLORIDE 0.9% FLUSH
3.0000 mL | Freq: Two times a day (BID) | INTRAVENOUS | Status: DC
  Administered 2015-09-11 – 2015-09-13 (×4): 3 mL via INTRAVENOUS

## 2015-09-11 MED ORDER — ENOXAPARIN SODIUM 100 MG/ML ~~LOC~~ SOLN
1.0000 mg/kg | Freq: Once | SUBCUTANEOUS | Status: AC
  Administered 2015-09-11: 90 mg via SUBCUTANEOUS
  Filled 2015-09-11: qty 1

## 2015-09-11 MED ORDER — ACETAMINOPHEN 325 MG PO TABS: 650.0000 mg | ORAL_TABLET | Freq: Four times a day (QID) | ORAL | Status: DC | PRN

## 2015-09-11 MED ORDER — SODIUM CHLORIDE 0.9 % IV SOLN
INTRAVENOUS | Status: DC
  Administered 2015-09-11 (×2): via INTRAVENOUS

## 2015-09-11 MED ORDER — INSULIN ASPART 100 UNIT/ML ~~LOC~~ SOLN
0.0000 [IU] | Freq: Three times a day (TID) | SUBCUTANEOUS | Status: DC
  Administered 2015-09-11: 2 [IU] via SUBCUTANEOUS
  Administered 2015-09-12 – 2015-09-13 (×2): 1 [IU] via SUBCUTANEOUS
  Filled 2015-09-11: qty 1
  Filled 2015-09-11: qty 2

## 2015-09-11 MED ORDER — LISINOPRIL 5 MG PO TABS
5.0000 mg | ORAL_TABLET | Freq: Every day | ORAL | Status: DC
  Administered 2015-09-11 – 2015-09-13 (×3): 5 mg via ORAL
  Filled 2015-09-11 (×3): qty 1

## 2015-09-11 NOTE — H&P (Signed)
Lisa Lowery is an 75 y.o. female.   Chief Complaint: Nausea and vomiting HPI: The patient presents to the emergency department complaining of nausea and vomiting. She states she has had multiple episodes of nonbloody nonbilious emesis tonight and intermittently over the last 6 months. She states that she frequently feels her heart racing during these episodes as she has tonight. She denies any chest pain or acute shortness of breath but she admits to exertional dyspnea. She denies orthopnea or paroxysmal nocturnal dyspnea. Upon arrival the patient telemetry showed atrial fibrillation with intermittently rapid ventricular rate. The patient has been hemodynamically stable. She was given 1 dose of therapeutic Lovenox in the emergency department prior to the request for admission to the hospitalist service.  Past Medical History  Diagnosis Date  . Vertigo   . Hypertension   . Diabetes [facility])     Past Surgical History  Procedure Laterality Date  . Small intestine surgery    . Abdominal hysterectomy      Family History  Problem Relation Age of Onset  . Cancer Mother   . Cancer Father   . Diabetes Sister    Social History:  reports that she has never smoked. She has never used smokeless tobacco. She reports that she does not drink alcohol or use illicit drugs.  Allergies: No Known Allergies  Prior to Admission medications   Medication Sig Start Date End Date Taking? Authorizing Provider  Insulin Detemir (LEVEMIR FLEXTOUCH) 100 UNIT/ML Pen Inject 25 Units into the skin daily at 10 pm. Patient taking differently: Inject 14 Units into the skin daily at 10 pm.  02/23/15  Yes Theodoro Grist, MD  lisinopril (PRINIVIL,ZESTRIL) 5 MG tablet Take 1 tablet (5 mg total) by mouth daily. 02/23/15  Yes Theodoro Grist, MD  VICTOZA 18 MG/3ML SOPN Inject 2.6 mLs into the skin every morning.   Yes Historical Provider, MD  atorvastatin (LIPITOR) 40 MG tablet Take 1 tablet (40 mg total) by mouth daily at 6  PM. Patient not taking: Reported on 09/11/2015 02/23/15   Theodoro Grist, MD  diazepam (VALIUM) 5 MG tablet Take 1 tablet (5 mg total) by mouth every 6 (six) hours as needed (dizziness). Patient not taking: Reported on 09/11/2015 02/23/15   Theodoro Grist, MD  insulin lispro (HUMALOG) 100 UNIT/ML injection Inject 0.04 mLs (4 Units total) into the skin 3 (three) times daily with meals. Patient not taking: Reported on 09/11/2015 02/23/15   Theodoro Grist, MD  meclizine (ANTIVERT) 25 MG tablet Take 1 tablet (25 mg total) by mouth 3 (three) times daily. Patient not taking: Reported on 09/11/2015 02/23/15   Theodoro Grist, MD  methylPREDNISolone (MEDROL DOSEPAK) 4 MG TBPK tablet follow package directions Patient not taking: Reported on 09/11/2015 02/23/15   Theodoro Grist, MD  ondansetron (ZOFRAN) 4 MG tablet Take 1 tablet (4 mg total) by mouth every 6 (six) hours as needed for nausea. Patient not taking: Reported on 09/11/2015 02/23/15   Theodoro Grist, MD     Results for orders placed or performed during the hospital encounter of 09/11/15 (from the past 48 hour(s))  Basic metabolic panel     Status: Abnormal   Collection Time: 09/11/15 12:33 AM  Result Value Ref Range   Sodium 141 135 - 145 mmol/L   Potassium 3.8 3.5 - 5.1 mmol/L   Chloride 112 (H) 101 - 111 mmol/L   CO2 24 22 - 32 mmol/L   Glucose, Bld 143 (H) 65 - 99 mg/dL   BUN 19 6 -  20 mg/dL   Creatinine, Ser 1.25 (H) 0.44 - 1.00 mg/dL   Calcium 8.0 (L) 8.9 - 10.3 mg/dL   GFR calc non Af Amer 41 (L) >60 mL/min   GFR calc Af Amer 47 (L) >60 mL/min    Comment: (NOTE) The eGFR has been calculated using the CKD EPI equation. This calculation has not been validated in all clinical situations. eGFR's persistently <60 mL/min signify possible Chronic Kidney Disease.    Anion gap 5 5 - 15  CBC     Status: None   Collection Time: 09/11/15 12:33 AM  Result Value Ref Range   WBC 8.8 3.6 - 11.0 K/uL   RBC 4.56 3.80 - 5.20 MIL/uL   Hemoglobin 14.2 12.0 -  16.0 g/dL   HCT 41.9 35.0 - 47.0 %   MCV 91.8 80.0 - 100.0 fL   MCH 31.0 26.0 - 34.0 pg   MCHC 33.8 32.0 - 36.0 g/dL   RDW 14.3 11.5 - 14.5 %   Platelets 221 150 - 440 K/uL  Troponin I     Status: Abnormal   Collection Time: 09/11/15 12:33 AM  Result Value Ref Range   Troponin I 0.52 (H) <0.031 ng/mL    Comment: READ BACK AND VERIFIED WITH NOEL WEBSTER AT 0102 09/11/15.PMH        POSSIBLE MYOCARDIAL ISCHEMIA. SERIAL TESTING RECOMMENDED.    Dg Chest Port 1 View  09/11/2015  CLINICAL DATA:  Shortness of breath. Vomiting today. Dyspnea. Diabetes. EXAM: PORTABLE CHEST 1 VIEW COMPARISON:  02/21/2015 FINDINGS: There is a moderate size left pleural effusion. Probable basilar atelectasis. Consolidation not excluded. No pneumothorax. Heart size is obscured by the effusion. Right lung is clear. Calcification of the aorta. IMPRESSION: Moderate size left pleural effusion. Electronically Signed   By: Lucienne Capers M.D.   On: 09/11/2015 01:19    Review of Systems  Constitutional: Negative for fever and chills.  HENT: Negative for sore throat and tinnitus.   Eyes: Negative for blurred vision and redness.  Respiratory: Positive for shortness of breath (chronic; exertional). Negative for cough.   Cardiovascular: Positive for palpitations. Negative for chest pain, orthopnea and PND.  Gastrointestinal: Positive for vomiting. Negative for nausea, abdominal pain and diarrhea.  Genitourinary: Negative for dysuria, urgency and frequency.  Musculoskeletal: Negative for myalgias and joint pain.  Skin: Negative for rash.       No lesions  Neurological: Negative for speech change, focal weakness and weakness.  Endo/Heme/Allergies: Does not bruise/bleed easily.       No temperature intolerance  Psychiatric/Behavioral: Negative for depression and suicidal ideas.    Blood pressure 162/88, pulse 119, temperature 97.5 F (36.4 C), temperature source Oral, resp. rate 18, height _0  (1.702 m), weight  90.719 kg (200 lb), SpO2 97 %. Physical Exam  Vitals reviewed. Constitutional: She is oriented to person, place, and time. She appears well-developed and well-nourished.  HENT:  Head: Normocephalic and atraumatic.  Mouth/Throat: Oropharynx is clear and moist.  Eyes: Conjunctivae and EOM are normal. Pupils are equal, round, and reactive to light. No scleral icterus.  Neck: Normal range of motion. Neck supple. No JVD present. No tracheal deviation present. No thyromegaly present.  Cardiovascular: Normal rate, regular rhythm and normal heart sounds.  Exam reveals no gallop and no friction rub.   No murmur heard. Respiratory: Effort normal and breath sounds normal.  GI: Soft. Bowel sounds are normal. She exhibits no distension. There is no tenderness.  Genitourinary:  Deferred  Musculoskeletal: Normal range  of motion. She exhibits edema (non-pitting).  Lymphadenopathy:    She has no cervical adenopathy.  Neurological: She is alert and oriented to person, place, and time. No cranial nerve deficit. She exhibits normal muscle tone.  Skin: Skin is warm and dry. No rash noted. No erythema.  Psychiatric: She has a normal mood and affect. Her behavior is normal. Judgment and thought content normal.     Assessment/Plan This is a 75 year old Caucasian female admitted for new onset atrial fibrillation with intermittent rapid ventricular rate. 1. Atrial fibrillation: New onset; intermittent rapid ventricular rate. The patient is hemodynamically stable. She was given no antiarrhythmics en route to the hospital. I will start an oral dose of diltiazem. Cardiology consult placed for the morning as well as echocardiogram ordered. CHADS score 3 (CHADS-VASc score 5).  Continue therapeutic Lovenox while hospitalized. The patient admits that she is very unsteady on her feet although she has not suffered any falls. Anticoagulation as an outpatient may not be her best option. Consider ablation and pacemaker. 2.  NSTEMI: Likely secondary to demand ischemia due to persistent tachycardia. Continue to monitor telemetry and follow cardiac enzymes. 3. Essential hypertension: Continue lisinopril 4. Diabetes mellitus type 2: Continue basal insulin. We will hold Victoza for now. Sliding scale insulin while hospitalized 5. Hyperlipidemia: Continue statin therapy 6. GI prophylaxis: None 7. DVT prophylaxis: As above The patient is a full code. Time spent on admission orders and patient care approximately 45 minutes  Harrie Foreman 09/11/2015, 1:43 AM

## 2015-09-11 NOTE — ED Notes (Signed)
TROPONIN 0.52 Dr Owens Shark

## 2015-09-11 NOTE — ED Notes (Signed)
Patient presents to Emergency Department via EMS with complaints of vomiting.  Per EMS pt has new onset AFIB.  A&O x4

## 2015-09-11 NOTE — ED Provider Notes (Signed)
[facility] Emergency Department Provider Note  ____________________________________________  Time seen: 12:35 AM  I have reviewed the triage vital signs and the nursing notes.   HISTORY  Chief Complaint Emesis and Irregular Heart Beat      HPI Lisa Lowery is a 75 y.o. female presents via [facility] with chief complaint of nausea vomiting and palpitations. Per EMS on their way in route to the emergency department and noted that the patient had an irregular heartbeat as such performed an EKG which revealed A. fib with rapid ventricular response.    Past Medical History  Diagnosis Date  . Vertigo   . Hypertension   . Diabetes [facility])     Patient Active Problem List   Diagnosis Date Noted  . Essential hypertension, malignant 02/23/2015  . Renal insufficiency 02/23/2015  . DM (diabetes mellitus) type 2, uncontrolled, with ketoacidosis ([place]) 02/23/2015  . Dizziness 02/21/2015  . Elevated troponin 02/21/2015  . HTN (hypertension) 02/21/2015  . Elevated blood sugar 02/21/2015    Past Surgical History  Procedure Laterality Date  . Small intestine surgery    . Abdominal hysterectomy      Current Outpatient Rx  Name  Route  Sig  Dispense  Refill  . atorvastatin (LIPITOR) 40 MG tablet   Oral   Take 1 tablet (40 mg total) by mouth daily at 6 PM.   30 tablet   2   . diazepam (VALIUM) 5 MG tablet   Oral   Take 1 tablet (5 mg total) by mouth every 6 (six) hours as needed (dizziness).   30 tablet   0   . Insulin Detemir (LEVEMIR FLEXTOUCH) 100 UNIT/ML Pen   Subcutaneous   Inject 25 Units into the skin daily at 10 pm.   15 mL   11   . insulin lispro (HUMALOG) 100 UNIT/ML injection   Subcutaneous   Inject 0.04 mLs (4 Units total) into the skin 3 (three) times daily with meals.   10 mL   11   . lisinopril (PRINIVIL,ZESTRIL) 5 MG tablet   Oral   Take 1 tablet (5 mg total) by mouth daily.   30 tablet   3   . meclizine (ANTIVERT)  25 MG tablet   Oral   Take 1 tablet (25 mg total) by mouth 3 (three) times daily.   30 tablet   0   . methylPREDNISolone (MEDROL DOSEPAK) 4 MG TBPK tablet      follow package directions   21 tablet   0   . ondansetron (ZOFRAN) 4 MG tablet   Oral   Take 1 tablet (4 mg total) by mouth every 6 (six) hours as needed for nausea.   20 tablet   0     Allergies No known drug allergies  Family History  Problem Relation Age of Onset  . Cancer Mother   . Cancer Father   . Diabetes Sister     Social History Social History  Substance Use Topics  . Smoking status: Never Smoker   . Smokeless tobacco: Never Used  . Alcohol Use: No    Review of Systems  Constitutional: Negative for fever. Eyes: Negative for visual changes. ENT: Negative for sore throat. Cardiovascular: Negative for chest pain. Positive palpitations Respiratory: Positive for shortness of breath. Gastrointestinal: Negative for abdominal pain, vomiting and diarrhea. Genitourinary: Negative for dysuria. Musculoskeletal: Negative for back pain. Skin: Negative for rash. Neurological: Negative for headaches, focal weakness or numbness.   10-point ROS otherwise  negative.  ____________________________________________   PHYSICAL EXAM:  VITAL SIGNS: ED Triage Vitals  Enc Vitals Group     BP 09/11/15 0034 162/88 mmHg     Pulse Rate 09/11/15 0034 119     Resp 09/11/15 0034 18     Temp 09/11/15 0034 97.5 F (36.4 C)     Temp Source 09/11/15 0034 Oral     SpO2 09/11/15 0034 97 %     Weight 09/11/15 0034 200 lb (90.719 kg)     Height 09/11/15 0034 5\' 7"  (1.702 m)     Head Cir --      Peak Flow --      Pain Score --      Pain Loc --      Pain Edu? --      Excl. in [place]? --      Constitutional: Alert and oriented. Well appearing and in no distress. Eyes: Conjunctivae are normal. PERRL. Normal extraocular movements. ENT   Head: Normocephalic and atraumatic.   Nose: No congestion/rhinnorhea.    Mouth/Throat: Mucous membranes are moist.   Neck: No stridor. Hematological/Lymphatic/Immunilogical: No cervical lymphadenopathy. Cardiovascular: Normal rate, regular rhythm. Normal and symmetric distal pulses are present in all extremities. No murmurs, rubs, or gallops. Respiratory: Normal respiratory effort without tachypnea nor retractions. Breath sounds are clear and equal bilaterally. No wheezes/rales/rhonchi. Gastrointestinal: Soft and nontender. No distention. There is no CVA tenderness. Genitourinary: deferred Musculoskeletal: Nontender with normal range of motion in all extremities. No joint effusions.  No lower extremity tenderness nor edema. Neurologic:  Normal speech and language. No gross focal neurologic deficits are appreciated. Speech is normal.  Skin:  Skin is warm, dry and intact. No rash noted. Psychiatric: Mood and affect are normal. Speech and behavior are normal. Patient exhibits appropriate insight and judgment.  ____________________________________________    LABS (pertinent positives/negatives)   ____________________________________________   EKG  ED ECG REPORT I, BROWN, [place] N, the attending physician, personally viewed and interpreted this ECG.   Date: 09/11/2015  EKG Time: 12:33AM  Rate: 114  Rhythm: Atrial fibrillation with rapid ventricular response  Axis: none  Intervals:irregular  ST&T Change: none   ____________________________________________    RADIOLOGY  DG Chest Port 1 View (Final result) Result time: 09/11/15 01:19:27   Final result by Rad Results In Interface (09/11/15 01:19:27)   Narrative:   CLINICAL DATA: Shortness of breath. Vomiting today. Dyspnea. Diabetes.  EXAM: PORTABLE CHEST 1 VIEW  COMPARISON: 02/21/2015  FINDINGS: There is a moderate size left pleural effusion. Probable basilar atelectasis. Consolidation not excluded. No pneumothorax. Heart size is obscured by the effusion. Right lung is clear.  Calcification of the aorta.  IMPRESSION: Moderate size left pleural effusion.   Electronically Signed By: Lucienne Capers M.D. On: 09/11/2015 01:19            Critical Care performed: CRITICAL CARE Performed by: Marjean Donna N   Total critical care time: 30 minutes  Critical care time was exclusive of separately billable procedures and treating other patients.  Critical care was necessary to treat or prevent imminent or life-threatening deterioration.  Critical care was time spent personally by me on the following activities: development of treatment plan with patient and/or surrogate as well as nursing, discussions with consultants, evaluation of patient's response to treatment, examination of patient, obtaining history from patient or surrogate, ordering and performing treatments and interventions, ordering and review of laboratory studies, ordering and review of radiographic studies, pulse oximetry and re-evaluation of patient's condition.  ____________________________________________  INITIAL IMPRESSION / ASSESSMENT AND PLAN / ED COURSE  Pertinent labs & imaging results that were available during my care of the patient were reviewed by me and considered in my medical decision making (see chart for details).  Patient with new onset A. fib with rapid ventricular response in addition to positive troponin. As such patient received Lovenox 1 mg/kg. Patient discussed with Dr. Marcille Blanco for hospital admission for further evaluation and management.  ____________________________________________   FINAL CLINICAL IMPRESSION(S) / ED DIAGNOSES  Final diagnoses:  Atrial fibrillation with RVR ([place])  NSTEMI (non-ST elevated myocardial infarction) ([place])  Pleural effusion, left      Gregor Hams, MD 09/11/15 [phone]

## 2015-09-11 NOTE — Progress Notes (Signed)
ANTICOAGULATION CONSULT NOTE - Initial Consult  Pharmacy Consult for Apixaban Indication: atrial fibrillation  No Known Allergies  Patient Measurements: Height: 5\' 7"  (170.2 cm) Weight: 200 lb (90.719 kg) IBW/kg (Calculated) : 61.6  Vital Signs: Temp: 97.5 F (36.4 C) (01/28 1113) Temp Source: Oral (01/28 1113) BP: 116/54 mmHg (01/28 1113) Pulse Rate: 85 (01/28 1113)  Labs:  Recent Labs  09/11/15 0033 09/11/15 0424 09/11/15 0955  HGB 14.2  --   --   HCT 41.9  --   --   PLT 221  --   --   CREATININE 1.25*  --   --   TROPONINI 0.52* 0.43* 0.42*    Estimated Creatinine Clearance: 44.2 mL/min (by C-G formula based on Cr of 1.25).   Medical History: Past Medical History  Diagnosis Date  . Vertigo   . Hypertension   . Diabetes ([place])     Medications:  Scheduled:  . apixaban  5 mg Oral BID  . atorvastatin  40 mg Oral q1800  . diltiazem  60 mg Oral 3 times per day  . docusate sodium  100 mg Oral BID  . [START ON 09/12/2015] Influenza vac split quadrivalent PF  0.5 mL Intramuscular Tomorrow-1000  . insulin aspart  0-9 Units Subcutaneous TID WC  . insulin detemir  16 Units Subcutaneous QHS  . lisinopril  5 mg Oral Daily  . [START ON 09/12/2015] pneumococcal 23 valent vaccine  0.5 mL Intramuscular Tomorrow-1000  . sodium chloride flush  3 mL Intravenous Q12H   Infusions:    Assessment: 75 y/o F with new-onset afib to begin apixaban. Patient received Lovenox 90 mg once.   Plan:  Will begin apixaban 5 mg bid starting now as it is greater than 12 hours since patient's dose of enoxaparin.   Viviana, Tarolli D 09/11/2015,3:16 PM

## 2015-09-11 NOTE — Clinical Social Work Note (Signed)
Clinical Social Work Assessment  Patient Details  Name: Lisa Lowery MRN: [id number] Date of Birth: December 06, 1940  Date of referral:  09/11/15               Reason for consult:  Facility Placement (ALF information )                Permission sought to share information with:    Permission granted to share information::     Name::        Agency::     Relationship::     Contact Information:     Housing/Transportation Living arrangements for the past 2 months:  Single Family Home Source of Information:  Patient, Medical Team Patient Interpreter Needed:  None Criminal Activity/Legal Involvement Pertinent to Current Situation/Hospitalization:    Significant Relationships:  Adult Children, Siblings Lives with:  Adult Children Do you feel safe going back to the place where you live?  Yes Need for family participation in patient care:  No (Coment)  Care giving concerns:  None at this time.  Social Worker assessment / plan: CSW met with patient to discuss information for ALF.  Patient currently lives with her son.  Recently moved to [facility] from [facility] to stay with son.  Lived with sister in [place].  Per patient her only income is SSA $32.00   States she received Medicaid in [facility].   Wanted information about going to ALF.  States she no longer wants to live with her son, as he has stairs.   States she has not started looking for an ALF in [place] but knows that she needs to go.  CSW discussed payment option with patient, patient is unable to private pay, she understands that she needs to terminate her Medicaid in [facility] and apply for Medicaid in [place].  CSW discussed [facility] as an option to assist her with the process.  Per patient she is currently working with this agency.  They are in the process of sending her additional information. CSW provided patient with a list of ALF facilities.       Employment status:    Forensic scientist:  Catering manager, [employer] Recommendations:  Not assessed at this  time Information / Referral to community resources:  Other (Comment Required) (ALF list )  Patient/Family's Response to care:  Patient was appreciative of information provided by CSW and will review the list and follow-up terminating her Medicaid so she can apply in [place].   Patient/Family's Understanding of and Emotional Response to Diagnosis, Current Treatment, and Prognosis:  Patient understands that she is under continued medical work up at this time.  Once medically stable she will discharge home and follow up with her PCP and complete the process for ALF placement from her son's house.  Emotional Assessment Appearance:  Appears stated age Attitude/Demeanor/Rapport:    Affect (typically observed):  Accepting, Adaptable, Hopeful, Pleasant, Calm Orientation:  Oriented to Self, Oriented to Place, Oriented to  Time, Oriented to Situation Alcohol / Substance use:    Psych involvement (Current and /or in the community):  No (Comment)  Discharge Needs  Concerns to be addressed:  Care Coordination, Discharge Planning Concerns Readmission within the last 30 days:  No Current discharge risk:  Chronically ill, Other (limited support system) Barriers to Discharge:  Continued Medical Work up, No Barriers Identified   Maurine Cane, LCSW 09/11/2015, 4:15 PM

## 2015-09-11 NOTE — Progress Notes (Signed)
[place] [facility] [place] NAME: Lisa Lowery    MR#:  [id number]  DATE OF BIRTH:  1941-07-12  SUBJECTIVE:    Pt. Here due to N/V and noted to new onset a. Fib w/RVR.  Remains in a. Fib but rate controlled. No chest pain, N/V.    REVIEW OF SYSTEMS:    Review of Systems  Constitutional: Negative for fever and chills.  HENT: Negative for congestion and tinnitus.   Eyes: Negative for blurred vision and double vision.  Respiratory: Negative for cough, shortness of breath and wheezing.   Cardiovascular: Negative for chest pain, orthopnea and PND.  Gastrointestinal: Negative for nausea, vomiting, abdominal pain and diarrhea.  Genitourinary: Negative for dysuria and hematuria.  Neurological: Negative for dizziness, sensory change and focal weakness.  All other systems reviewed and are negative.   Nutrition: Heart Healthy/Carb control Tolerating Diet: Yes Tolerating PT: Ambulatory.    DRUG ALLERGIES:  No Known Allergies  VITALS:  Blood pressure 116/54, pulse 85, temperature 97.5 F (36.4 C), temperature source Oral, resp. rate 20, height 5\' 7"  (1.702 m), weight 90.719 kg (200 lb), SpO2 96 %.  PHYSICAL EXAMINATION:   Physical Exam  GENERAL:  75 y.o.-year-old patient lying in the bed in no acute distress.  EYES: Pupils equal, round, reactive to light and accommodation. No scleral icterus. Extraocular muscles intact.  HEENT: Head atraumatic, normocephalic. Oropharynx and nasopharynx clear.  NECK:  Supple, no jugular venous distention. No thyroid enlargement, no tenderness.  LUNGS: Normal breath sounds bilaterally, no wheezing, rales, rhonchi. No use of accessory muscles of respiration.  CARDIOVASCULAR: S1, S2 Irregular. No murmurs, rubs, or gallops.  ABDOMEN: Soft, nontender, nondistended. Bowel sounds present. No organomegaly or mass.  EXTREMITIES: No cyanosis, clubbing or edema b/l.    NEUROLOGIC: Cranial nerves II through XII are  intact. No focal Motor or sensory deficits b/l.   PSYCHIATRIC: The patient is alert and oriented x 3.  SKIN: No obvious rash, lesion, or ulcer.    LABORATORY PANEL:   CBC  Recent Labs Lab 09/11/15 0033  WBC 8.8  HGB 14.2  HCT 41.9  PLT 221   ------------------------------------------------------------------------------------------------------------------  Chemistries   Recent Labs Lab 09/11/15 0033  NA 141  K 3.8  CL 112*  CO2 24  GLUCOSE 143*  BUN 19  CREATININE 1.25*  CALCIUM 8.0*   ------------------------------------------------------------------------------------------------------------------  Cardiac Enzymes  Recent Labs Lab 09/11/15 0955  TROPONINI 0.42*   ------------------------------------------------------------------------------------------------------------------  RADIOLOGY:  Dg Chest Port 1 View  09/11/2015  CLINICAL DATA:  Shortness of breath. Vomiting today. Dyspnea. Diabetes. EXAM: PORTABLE CHEST 1 VIEW COMPARISON:  02/21/2015 FINDINGS: There is a moderate size left pleural effusion. Probable basilar atelectasis. Consolidation not excluded. No pneumothorax. Heart size is obscured by the effusion. Right lung is clear. Calcification of the aorta. IMPRESSION: Moderate size left pleural effusion. Electronically Signed   By: Lucienne Capers M.D.   On: 09/11/2015 01:19     ASSESSMENT AND PLAN:   75 year old female with history of diabetes, hypertension, hyperlipidemia presented to the hospital with nausea vomiting and noted to be in new onset fibrillation with RVR.  #1 atrial fibrillation with RVR-patient is presently rate controlled on oral Cardizem. - CHADS score is over 4.  Likely needs to be on long term anti-coagulation and will start Eliquis.  - appreciate Cards eval.  Await Echo results.   # 2 elevated troponin-likely setting of demand ischemia from it fibrillation with RVR.  -no evidence of  acute coronary syndrome. Follow two-dimensional  echo.  #3 hyperlipidemia-continue atorvastatin.  #4 hypertension-continue lisinopril.  #5 type 2 diabetes without, dictation-continue Levemir, sliding scale insulin. Hold Victoza.  All the records are reviewed and case discussed with Care Management/Social Workerr. Management plans discussed with the patient, family and they are in agreement.  CODE STATUS: Full  DVT Prophylaxis: Eliquis  TOTAL TIME TAKING CARE OF THIS PATIENT: 30 minutes.   POSSIBLE D/C IN 1-2 DAYS, DEPENDING ON CLINICAL CONDITION.   Henreitta Leber M.D on 09/11/2015 at 3:04 PM  Between 7am to 6pm - Pager - [phone]  After 6pm go to [email or web address] - password [email or web address] [facility]  Office  [phone]  CC: Primary care physician; Lavera Guise, MD

## 2015-09-11 NOTE — Consult Note (Signed)
[facility] Consultation Note  Patient ID: Basha Harshman, MRN: [id number], DOB/AGE: 08/30/1940 75 y.o. Admit date: 09/11/2015   Date of Consult: 09/11/2015 Primary Physician: Lavera Guise, MD Primary Cardiologist: None  Chief Complaint:  Chief Complaint  Patient presents with  . Emesis  . Irregular Heart Beat   Reason for Consult: atrial fibrillation with rapid ventricular rate  HPI: 75 y.o. female with known apparent previous essential hypertension with mixed hyperlipidemia and progressive episodes of shortness of breath with physical activity waxing and waning over the last many months with now worsening symptoms of shortness of breath weakness and fatigue. The patient was seen in the emergency room and felt to have some issues of cardiac in origin. When seen the patient had an EKG showing atrial fibrillation with rapid ventricular rate and nonspecific ST and T-wave changes. The patient also had an elevated troponin of 0.52 but no evidence of chest discomfort at this time possibly more consistent with demand ischemia rather than acute coronary syndrome. Previously the patient was on high intensity cholesterol therapy and insulin injection as well as lisinopril for hypertension control stable at this time. With her and new onset of atrial fibrillation she will need further evaluation. Currently the patient has had stability hemodynamically and no evidence of chest discomfort  Past Medical History  Diagnosis Date  . Vertigo   . Hypertension   . Diabetes [facility])       Surgical History:  Past Surgical History  Procedure Laterality Date  . Small intestine surgery    . Abdominal hysterectomy       Home Meds: Prior to Admission medications   Medication Sig Start Date End Date Taking? Authorizing Provider  Insulin Detemir (LEVEMIR FLEXTOUCH) 100 UNIT/ML Pen Inject 25 Units into the skin daily at 10 pm. Patient taking differently: Inject 14 Units into the skin daily at 10 pm.   02/23/15  Yes Theodoro Grist, MD  lisinopril (PRINIVIL,ZESTRIL) 5 MG tablet Take 1 tablet (5 mg total) by mouth daily. 02/23/15  Yes Theodoro Grist, MD  VICTOZA 18 MG/3ML SOPN Inject 2.6 mLs into the skin every morning.   Yes Historical Provider, MD  atorvastatin (LIPITOR) 40 MG tablet Take 1 tablet (40 mg total) by mouth daily at 6 PM. Patient not taking: Reported on 09/11/2015 02/23/15   Theodoro Grist, MD  diazepam (VALIUM) 5 MG tablet Take 1 tablet (5 mg total) by mouth every 6 (six) hours as needed (dizziness). Patient not taking: Reported on 09/11/2015 02/23/15   Theodoro Grist, MD  insulin lispro (HUMALOG) 100 UNIT/ML injection Inject 0.04 mLs (4 Units total) into the skin 3 (three) times daily with meals. Patient not taking: Reported on 09/11/2015 02/23/15   Theodoro Grist, MD  meclizine (ANTIVERT) 25 MG tablet Take 1 tablet (25 mg total) by mouth 3 (three) times daily. Patient not taking: Reported on 09/11/2015 02/23/15   Theodoro Grist, MD  methylPREDNISolone (MEDROL DOSEPAK) 4 MG TBPK tablet follow package directions Patient not taking: Reported on 09/11/2015 02/23/15   Theodoro Grist, MD  ondansetron (ZOFRAN) 4 MG tablet Take 1 tablet (4 mg total) by mouth every 6 (six) hours as needed for nausea. Patient not taking: Reported on 09/11/2015 02/23/15   Theodoro Grist, MD    Inpatient Medications:  . atorvastatin  40 mg Oral q1800  . diltiazem  60 mg Oral 3 times per day  . docusate sodium  100 mg Oral BID  . [START ON 09/12/2015] Influenza vac split quadrivalent PF  0.5  mL Intramuscular Tomorrow-1000  . insulin aspart  0-9 Units Subcutaneous TID WC  . insulin detemir  16 Units Subcutaneous QHS  . lisinopril  5 mg Oral Daily  . [START ON 09/12/2015] pneumococcal 23 valent vaccine  0.5 mL Intramuscular Tomorrow-1000  . sodium chloride flush  3 mL Intravenous Q12H   . sodium chloride 125 mL/hr at 09/11/15 0443    Allergies: No Known Allergies  Social History   Social History  . Marital Status:  Divorced    Spouse Name: N/A  . Number of Children: N/A  . Years of Education: N/A   Occupational History  . Not on file.   Social History Main Topics  . Smoking status: Never Smoker   . Smokeless tobacco: Never Used  . Alcohol Use: No  . Drug Use: No  . Sexual Activity: No   Other Topics Concern  . Not on file   Social History Narrative     Family History  Problem Relation Age of Onset  . Cancer Mother   . Cancer Father   . Diabetes Sister      Review of Systems Positive for shortness of breath Negative for: General:  chills, fever, night sweats or weight changes.  Cardiovascular: PND orthopnea syncope dizziness  Dermatological skin lesions rashes Respiratory: Cough congestion Urologic: Frequent urination urination at night and hematuria Abdominal: negative for nausea, vomiting, diarrhea, bright red blood per rectum, melena, or hematemesis Neurologic: negative for visual changes, and/or hearing changes  All other systems reviewed and are otherwise negative except as noted above.  Labs:  Recent Labs  09/11/15 0033 09/11/15 0424  TROPONINI 0.52* 0.43*   Lab Results  Component Value Date   WBC 8.8 09/11/2015   HGB 14.2 09/11/2015   HCT 41.9 09/11/2015   MCV 91.8 09/11/2015   PLT 221 09/11/2015    Recent Labs Lab 09/11/15 0033  NA 141  K 3.8  CL 112*  CO2 24  BUN 19  CREATININE 1.25*  CALCIUM 8.0*  GLUCOSE 143*   Lab Results  Component Value Date   CHOL 203* 02/21/2015   HDL 37* 02/21/2015   LDLCALC 141* 02/21/2015   TRIG 127 02/21/2015   No results found for: DDIMER  Radiology/Studies:  Dg Chest Port 1 View  09/11/2015  CLINICAL DATA:  Shortness of breath. Vomiting today. Dyspnea. Diabetes. EXAM: PORTABLE CHEST 1 VIEW COMPARISON:  02/21/2015 FINDINGS: There is a moderate size left pleural effusion. Probable basilar atelectasis. Consolidation not excluded. No pneumothorax. Heart size is obscured by the effusion. Right lung is clear.  Calcification of the aorta. IMPRESSION: Moderate size left pleural effusion. Electronically Signed   By: Lucienne Capers M.D.   On: 09/11/2015 01:19    EKG: Atrial fibrillation with rapid ventricular rate and nonspecific ST changes  Weights: Filed Weights   09/11/15 0034  Weight: 200 lb (90.719 kg)     Physical Exam: Blood pressure 156/72, pulse 106, temperature 98 F (36.7 C), temperature source Oral, resp. rate 20, height 5\' 7"  (1.702 m), weight 200 lb (90.719 kg), SpO2 95 %. Body mass index is 31.32 kg/(m^2). General: Well developed, well nourished, in no acute distress. Head eyes ears nose throat: Normocephalic, atraumatic, sclera non-icteric, no xanthomas, nares are without discharge. No apparent thyromegaly and/or mass  Lungs: Normal respiratory effort.  no wheezes, no rales, no rhonchi.  Heart: Irregular with normal S1 S2. 2+ mitral murmur gallop, no rub, PMI is normal size and placement, carotid upstroke normal without bruit, jugular venous pressure  is normal Abdomen: Soft, non-tender, non-distended with normoactive bowel sounds. No hepatomegaly. No rebound/guarding. No obvious abdominal masses. Abdominal aorta is normal size without bruit Extremities: Trace edema. no cyanosis, no clubbing, no ulcers  Peripheral : 2+ bilateral upper extremity pulses, 2+ bilateral femoral pulses, 2+ bilateral dorsal pedal pulse Neuro: Alert and oriented. No facial asymmetry. No focal deficit. Moves all extremities spontaneously. Musculoskeletal: Normal muscle tone without kyphosis Psych:  Responds to questions appropriately with a normal affect.    Assessment: 75 year old female with essential hypertension makes hyperlipidemia progression of episodes of shortness of breath with new onset of nonvalvular atrial fibrillation with rapid ventricular rate and elevated troponin  Plan: 1. Continue serial ECG and enzymes to assess for possible myocardial infarction 2. Echocardiogram for further  evaluation of valvular heart disease R failure and myocardial infarction with atrial fibrillation 3. Anticoagulation for further risk reduction of stroke with atrial fibrillation until further evaluation and treatment options 4. ACE inhibitor for hypertension control 5. Diltiazem for heart rate control 6. High intensity cholesterol therapy with atorvastatin 7. Ambulation and follow for other significant issues and symptoms and possible further consideration of stress test versus cardiac catheterization depending on above  Signed, Corey Skains M.D. [facility] 09/11/2015, 9:32 AM

## 2015-09-12 LAB — GLUCOSE, CAPILLARY
Glucose-Capillary: 55 mg/dL — ABNORMAL LOW (ref 65–99)
Glucose-Capillary: 56 mg/dL — ABNORMAL LOW (ref 65–99)
Glucose-Capillary: 85 mg/dL (ref 65–99)
Glucose-Capillary: 143 mg/dL — ABNORMAL HIGH (ref 65–99)
Glucose-Capillary: 93 mg/dL (ref 65–99)
Glucose-Capillary: 151 mg/dL — ABNORMAL HIGH (ref 65–99)

## 2015-09-12 NOTE — Progress Notes (Signed)

## 2015-09-12 NOTE — Progress Notes (Signed)
[place] [facility] [place] NAME: Lisa Lowery    MR#:  [id number]  DATE OF BIRTH:  February 05, 1941  SUBJECTIVE:    Pt. Here due to N/V and noted to new onset a. Fib w/RVR.  No complaints today.  Remains in a. Fib but rate controlled.    REVIEW OF SYSTEMS:    Review of Systems  Constitutional: Negative for fever and chills.  HENT: Negative for congestion and tinnitus.   Eyes: Negative for blurred vision and double vision.  Respiratory: Negative for cough, shortness of breath and wheezing.   Cardiovascular: Negative for chest pain, orthopnea and PND.  Gastrointestinal: Negative for nausea, vomiting, abdominal pain and diarrhea.  Genitourinary: Negative for dysuria and hematuria.  Neurological: Negative for dizziness, sensory change and focal weakness.  All other systems reviewed and are negative.   Nutrition: Heart Healthy/Carb control Tolerating Diet: Yes Tolerating PT: Eval noted.  DRUG ALLERGIES:  No Known Allergies  VITALS:  Blood pressure 156/60, pulse 95, temperature 97.8 F (36.6 C), temperature source Oral, resp. rate 18, height 5\' 7"  (1.702 m), weight 87.136 kg (192 lb 1.6 oz), SpO2 96 %.  PHYSICAL EXAMINATION:   Physical Exam  GENERAL:  75 y.o.-year-old patient lying in the bed in no acute distress.  EYES: Pupils equal, round, reactive to light and accommodation. No scleral icterus. Extraocular muscles intact.  HEENT: Head atraumatic, normocephalic. Oropharynx and nasopharynx clear.  NECK:  Supple, no jugular venous distention. No thyroid enlargement, no tenderness.  LUNGS: Normal breath sounds bilaterally, no wheezing, rales, rhonchi. No use of accessory muscles of respiration.  CARDIOVASCULAR: S1, S2 Irregular. No murmurs, rubs, or gallops.  ABDOMEN: Soft, nontender, nondistended. Bowel sounds present. No organomegaly or mass.  EXTREMITIES: No cyanosis, clubbing or edema b/l.    NEUROLOGIC: Cranial nerves II through XII  are intact. No focal Motor or sensory deficits b/l.   PSYCHIATRIC: The patient is alert and oriented x 3.  SKIN: No obvious rash, lesion, or ulcer.    LABORATORY PANEL:   CBC  Recent Labs Lab 09/11/15 0033  WBC 8.8  HGB 14.2  HCT 41.9  PLT 221   ------------------------------------------------------------------------------------------------------------------  Chemistries   Recent Labs Lab 09/11/15 0033  NA 141  K 3.8  CL 112*  CO2 24  GLUCOSE 143*  BUN 19  CREATININE 1.25*  CALCIUM 8.0*   ------------------------------------------------------------------------------------------------------------------  Cardiac Enzymes  Recent Labs Lab 09/11/15 1604  TROPONINI 0.35*   ------------------------------------------------------------------------------------------------------------------  RADIOLOGY:  Dg Chest Port 1 View  09/11/2015  CLINICAL DATA:  Shortness of breath. Vomiting today. Dyspnea. Diabetes. EXAM: PORTABLE CHEST 1 VIEW COMPARISON:  02/21/2015 FINDINGS: There is a moderate size left pleural effusion. Probable basilar atelectasis. Consolidation not excluded. No pneumothorax. Heart size is obscured by the effusion. Right lung is clear. Calcification of the aorta. IMPRESSION: Moderate size left pleural effusion. Electronically Signed   By: Lucienne Capers M.D.   On: 09/11/2015 01:19     ASSESSMENT AND PLAN:   75 year old female with history of diabetes, hypertension, hyperlipidemia presented to the hospital with nausea vomiting and noted to be in new onset fibrillation with RVR.  #1 atrial fibrillation with RVR-patient is presently rate controlled on oral Cardizem. - CHADS score is over 4 and started on Eliquis yesterday.  - appreciate Cards eval.  echo in July 2016 showed normal ejection fraction and repeat echo on this admission is pending. -Clinically asymptomatic.  # 2 elevated troponin-likely setting of demand ischemia from it  fibrillation with RVR.   -no evidence of acute coronary syndrome. Await echo results.  #3 hyperlipidemia-continue atorvastatin.  #4 hypertension-continue lisinopril.  #5 type 2 diabetes without, dictation-continue Levemir, sliding scale insulin. Hold Victoza.  Seen by physical therapy and recommended home health with 24-hour supervision. Patient says that she is homeless and therefore we will await further input from social work/case management prior to discharge.  All the records are reviewed and case discussed with Care Management/Social Workerr. Management plans discussed with the patient, family and they are in agreement.  CODE STATUS: Full  DVT Prophylaxis: Eliquis  TOTAL TIME TAKING CARE OF THIS PATIENT: 30 minutes.   POSSIBLE D/C IN 1-2 DAYS, DEPENDING ON CLINICAL CONDITION.  Greater than 50% time spent in coordination of care and discussion with care management/social work.  Henreitta Leber M.D on 09/12/2015 at 2:46 PM  Between 7am to 6pm - Pager - [phone]  After 6pm go to [email or web address] - password [email or web address] [facility]  Office  [phone]  CC: Primary care physician; Lavera Guise, MD

## 2015-09-12 NOTE — Evaluation (Signed)
Physical Therapy Evaluation Patient Details Name: Lisa Lowery MRN: [id number] DOB: 1941/02/09 Today's Date: 09/12/2015   History of Present Illness  75 yo female with onset of a-fib was admitted for SOB, noted L pleural effusion, demand ischemia,  PMHx:  HLD, DM, vertigo,   Clinical Impression  Pt was seen for assessment of mobility and did discuss with MD and nursing.  Her needs are ALF and HHPT but is not skilled enough to be SNF level of care as of today's eval.  Will continue PT hospital care and will have pt focus on balance and standing control.    Follow Up Recommendations Home health PT;Supervision/Assistance - 24 hour    Equipment Recommendations  Rolling walker with 5" wheels    Recommendations for Other Services Rehab consult     Precautions / Restrictions Precautions Precautions: Fall Restrictions Weight Bearing Restrictions: No      Mobility  Bed Mobility Overal bed mobility: Needs Assistance Bed Mobility: Supine to Sit     Supine to sit: Min guard;Min assist     General bed mobility comments: Pt can move then stops and needs PT help/prompting  Transfers Overall transfer level: Needs assistance Equipment used: Rolling walker (2 wheeled) Transfers: Sit to/from [facility] Sit to Stand: Mod assist;Supervision Stand pivot transfers: Supervision;Min guard       General transfer comment: Pt asked for help to stand from bed but then could perform the task at her chair  Ambulation/Gait Ambulation/Gait assistance: Min guard;Min assist Ambulation Distance (Feet): 120 Feet Assistive device: Rolling walker (2 wheeled);1 person hand held assist Gait Pattern/deviations: Step-through pattern;Wide base of support (frequent stops and SOB, sats were 97% post gait) Gait velocity: variable, both faster and slower Gait velocity interpretation: at or above normal speed for age/gender    Stairs            Wheelchair Mobility    Modified  Rankin (Stroke Patients Only)       Balance Overall balance assessment: Needs assistance Sitting-balance support: Feet supported Sitting balance-Leahy Scale: Good     Standing balance support: Bilateral upper extremity supported Standing balance-Leahy Scale: Fair Standing balance comment: fair balance once set'                             Pertinent Vitals/Pain Pain Assessment: No/denies pain    Home Living Family/patient expects to be discharged to:: Assisted living Living Arrangements: Children             Home Equipment: Gilford Rile - 2 wheels Additional Comments: Gilford Rile is in [facility] home    Prior Function Level of Independence: Independent with assistive device(s)         Comments: limited community ambulator, uses electric shopping carts when out in stores     Hand Dominance        Extremity/Trunk Assessment   Upper Extremity Assessment: Overall WFL for tasks assessed           Lower Extremity Assessment: Generalized weakness      Cervical / Trunk Assessment: Kyphotic  Communication   Communication: No difficulties  Cognition Arousal/Alertness: Awake/alert Behavior During Therapy: Anxious Overall Cognitive Status: Within Functional Limits for tasks assessed                      General Comments General comments (skin integrity, edema, etc.): Pt was able to assist with all mobility but did have initial standing issues.  Her anxious  appearance is understandable with her homelessness, feel she just needs to be in ALF setting to have sufficient care with HHPT    Exercises        Assessment/Plan    PT Assessment Patient needs continued PT services  PT Diagnosis Difficulty walking   PT Problem List Decreased strength;Decreased range of motion;Decreased activity tolerance;Decreased balance;Decreased mobility;Decreased coordination;Decreased knowledge of use of DME;Decreased safety awareness;Decreased knowledge of  precautions;Cardiopulmonary status limiting activity (SOB but O2 sats were stable)  PT Treatment Interventions Gait training;DME instruction;Stair training;Functional mobility training;Therapeutic activities;Therapeutic exercise;Balance training;Neuromuscular re-education;Patient/family education   PT Goals (Current goals can be found in the Care Plan section) Acute Rehab PT Goals Patient Stated Goal: to get therapy  PT Goal Formulation: With patient Time For Goal Achievement: 09/26/15 Potential to Achieve Goals: Good    Frequency Min 2X/week   Barriers to discharge Inaccessible home environment;Decreased caregiver support son is working    Co-evaluation               End of Session Equipment Utilized During Treatment: Gait belt Activity Tolerance: Patient tolerated treatment well Patient left: in chair;with call bell/phone within reach;with chair alarm set Nurse Communication: Mobility status         Time: [id number] PT Time Calculation (min) (ACUTE ONLY): 24 min   Charges:   PT Evaluation $PT Eval Low Complexity: 1 Procedure PT Treatments $Gait Training: 8-22 mins   PT G Codes:        Ramond Dial 09-22-15, 2:22 PM   Mee Hives, PT MS Acute Rehab Dept. Number: [facility] [phone] and [place] [phone]

## 2015-09-12 NOTE — Care Management Note (Addendum)
Case Management Note  Patient Details  Name: Lashia Freeland MRN: [id number] Date of Birth: Sep 03, 1940  Subjective/Objective:     Waiting for PT evaluation. Discussed discharge planning briefly with Mrs Hojnacki. Mrs Jankovic stated to this Probation officer "I am homeless.I was living with my sister in [place] but she has moved on and I no longer have a place to go to in [place]. I cannot stay with my son in [place], [place]  who is an alcoholic. My son in [place], [place] is unable to take me in. I have no where to go."  This Probation officer updated Judson Roch, CSW, of Mrs Vinluan's statements this morning. Anticipate discharge tomorrow. This Probation officer has been unable to obtain an address where Mrs Xiang will be going from her.Per Social work notes, Mrs Charter has been in contact with [facility] regarding community resources. This Probation officer spoke on the phone with Mrs Leifheit's son Naleigh Bookhart who resides in [place] and who stated that his Mother cannot live with him. This Probation officer left a voice message for son Charyn Haering who lives in [place] requesting a call back to discuss housing arrangements for his Mother.                 Action/Plan:   Expected Discharge Date:                  Expected Discharge Plan:     In-House Referral:     Discharge planning Services     Post Acute Care Choice:    Choice offered to:     DME Arranged:    DME Agency:     HH Arranged:    [place] Agency:     Status of Service:     Medicare Important Message Given:    Date Medicare IM Given:    Medicare IM give by:    Date Additional Medicare IM Given:    Additional Medicare Important Message give by:     If discussed at [place] of Stay Meetings, dates discussed:    Additional Comments:  Melbert Botelho A, RN 09/12/2015, 10:48 AM

## 2015-09-12 NOTE — Progress Notes (Signed)
[facility] Encounter Note  Patient: Lisa Lowery / Admit Date: 09/11/2015 / Date of Encounter: 09/12/2015, 7:06 AM   Subjective: Patient is feeling much better with less shortness of breath and heart rate is better control  Review of Systems: Positive for: Mild shortness of breath and weakness Negative for: Vision change, hearing change, syncope, dizziness, nausea, vomiting,diarrhea, bloody stool, stomach pain, cough, congestion, diaphoresis, urinary frequency, urinary pain,skin lesions, skin rashes Others previously listed  Objective: Telemetry: Atrial fibrillation with controlled ventricular rate Physical Exam: Blood pressure 145/51, pulse 87, temperature 97.8 F (36.6 C), temperature source Oral, resp. rate 16, height 5\' 7"  (1.702 m), weight 192 lb 1.6 oz (87.136 kg), SpO2 93 %. Body mass index is 30.08 kg/(m^2). General: Well developed, well nourished, in no acute distress. Head: Normocephalic, atraumatic, sclera non-icteric, no xanthomas, nares are without discharge. Neck: No apparent masses Lungs: Normal respirations with no wheezes, no rhonchi, no rales , few crackles   Heart: irregular rate and rhythm, normal S1 S2, no murmur, no rub, no gallop, PMI is normal size and placement, carotid upstroke normal without bruit, jugular venous pressure normal Abdomen: Soft, non-tender, non-distended with normoactive bowel sounds. No hepatosplenomegaly. Abdominal aorta is normal size without bruit Extremities: Trace edema, no clubbing, no cyanosis, no ulcers,  Peripheral: 2+ radial, 2+ femoral, 2+ dorsal pedal pulses Neuro: Alert and oriented. Moves all extremities spontaneously. Psych:  Responds to questions appropriately with a normal affect.   Intake/Output Summary (Last 24 hours) at 09/12/15 0706 Last data filed at 09/11/15 1700  Gross per 24 hour  Intake    600 ml  Output      0 ml  Net    600 ml    Inpatient Medications:  . apixaban  5 mg Oral BID  .  atorvastatin  40 mg Oral q1800  . diltiazem  60 mg Oral 3 times per day  . docusate sodium  100 mg Oral BID  . Influenza vac split quadrivalent PF  0.5 mL Intramuscular Tomorrow-1000  . insulin aspart  0-9 Units Subcutaneous TID WC  . insulin detemir  16 Units Subcutaneous QHS  . lisinopril  5 mg Oral Daily  . pneumococcal 23 valent vaccine  0.5 mL Intramuscular Tomorrow-1000  . sodium chloride flush  3 mL Intravenous Q12H   Infusions:    Labs:  Recent Labs  09/11/15 0033  NA 141  K 3.8  CL 112*  CO2 24  GLUCOSE 143*  BUN 19  CREATININE 1.25*  CALCIUM 8.0*   No results for input(s): AST, ALT, ALKPHOS, BILITOT, PROT, ALBUMIN in the last 72 hours.  Recent Labs  09/11/15 0033  WBC 8.8  HGB 14.2  HCT 41.9  MCV 91.8  PLT 221    Recent Labs  09/11/15 0033 09/11/15 0424 09/11/15 0955 09/11/15 1604  TROPONINI 0.52* 0.43* 0.42* 0.35*   Invalid input(s): POCBNP  Recent Labs  09/11/15 0033  HGBA1C 5.1     Weights: Filed Weights   09/11/15 0034 09/12/15 0615  Weight: 200 lb (90.719 kg) 192 lb 1.6 oz (87.136 kg)     Radiology/Studies:  Dg Chest Port 1 View  09/11/2015  CLINICAL DATA:  Shortness of breath. Vomiting today. Dyspnea. Diabetes. EXAM: PORTABLE CHEST 1 VIEW COMPARISON:  02/21/2015 FINDINGS: There is a moderate size left pleural effusion. Probable basilar atelectasis. Consolidation not excluded. No pneumothorax. Heart size is obscured by the effusion. Right lung is clear. Calcification of the aorta. IMPRESSION: Moderate size left pleural effusion. Electronically  Signed   By: Lucienne Capers M.D.   On: 09/11/2015 01:19     Assessment and Recommendation  75 y.o. female with the new onset of atrial fibrillation with rapid ventricular rate now much improved with appropriate medication management including Cardizem and no further evidence of significant chest discomfort or heart failure type symptoms. There is no current evidence of myocardial infarction but  elevated troponin most consistent with demand ischemia 1. Continue current Cardizem for heart rate control but possible change to long-acting Cardizem orally for discharge to home 2. Anticoagulation with L a quest 5 mg twice per day for further risk reduction of stroke with atrial fibrillation 3. High intensity cholesterol therapy with atorvastatin 4. Begin ambulation and follow for further significant symptoms but possible discharge to home with above and follow-up next week for further adjustments of medication management   Signed, Serafina Royals M.D. FACC

## 2015-09-12 NOTE — Progress Notes (Signed)
Hypoglycemic Event  CBG: 55  Treatment:4 oz orange juice  Symptoms:none  Follow-up CBG: [id number] CBG Result:56  Possible Reasons for Event: No snack last night   Comments/MD notified:Repeat 4 oz of orange juice repeat blood sugar at [place]

## 2015-09-13 LAB — GLUCOSE, CAPILLARY
Glucose-Capillary: 65 mg/dL (ref 65–99)
Glucose-Capillary: 130 mg/dL — ABNORMAL HIGH (ref 65–99)

## 2015-09-13 MED ORDER — DILTIAZEM HCL ER COATED BEADS 180 MG PO CP24: 180.0000 mg | ORAL_CAPSULE | Freq: Every day | ORAL | Status: DC

## 2015-09-13 MED ORDER — APIXABAN 5 MG PO TABS: 5.0000 mg | ORAL_TABLET | Freq: Two times a day (BID) | ORAL | Status: DC

## 2015-09-13 NOTE — Care Management Important Message (Signed)
Important Message  Patient Details  Name: Hedda Mitri MRN: [id number] Date of Birth: 1941-03-23   Medicare Important Message Given:  Yes    Juliann Pulse A Saivon Prowse 09/13/2015, 10:07 AM

## 2015-09-13 NOTE — Care Management (Signed)
Patient moved to [place] to live with her son Jaeley [place] in July 2016.  She had medicaid in [facility] but has not initiated a transfer of benefits to [place].  She was put in touch in [employer] 2 days prior to this admission to assist with medicaid application.  Patient's residence address is [place].   Richard's phone number is [phone].  Patient denies multiple times that she is fearful of returning to this living arrangement.  She denies any sort of verbal, emotional or physical abuse.  She says that her son has mental health issues but is not being treated or officially diagnosed.  She says that she is agreeable to home health SN PT and social work.  She is in need of social work to assist patient with [facility] so that she may pursue assisted living.  She is being discharged on new Eliquis and provided patient with coupon for 30 day free.  She does not have agency preference for home health when reviews the agency list.  Referral to [facility].

## 2015-09-13 NOTE — Progress Notes (Signed)
CSW was informed by RN Case Manager that patient needed transportation home. She reports that patient is discharging today. CSW provided patient with an [employer] as well as the [employer], quantity) Program and [employer]. CSW explained to patient taht [facility] can provide her transportation to and from appointments, grocery shopping, etc. CSW explained to patient that she has to arrange service a day in advance. Patient reports she understands [facility] services. CSW provided patient with a taxi voucher to return home [place]. [place] called [employer]. They will pick patient up at the [place] entrance. Will call RN when they are outside. Informed RN of above. There are no other CSW needs at this time. CSW will be available if a need were to arise.   Ernest Pine, MSW, [place] Social Work Department [phone]

## 2015-09-13 NOTE — Care Management (Signed)
Patient says she does not have transportation to drug store for at least one week.  She gets her meds from [facility] on [address].  She is agreeable to have her scripts sent to a drug store that will deliver.  Spoke with [facility] and informed they will deliver to patient's address.  They are in network with patient's insurance.  Faxed the script and eliquis coupon and received confirmation of the fax

## 2015-09-13 NOTE — Discharge Summary (Signed)
Lisa Lowery at [place] NAME: Lisa Lowery    MR#:  [id number]  DATE OF BIRTH:  12-29-1940  DATE OF ADMISSION:  09/11/2015 ADMITTING PHYSICIAN: Harrie Foreman, MD  DATE OF DISCHARGE: 09/13/2015  3:15 PM  PRIMARY CARE PHYSICIAN: Lavera Guise, MD    ADMISSION DIAGNOSIS:  NSTEMI (non-ST elevated myocardial infarction) ([place]) [I21.4] Pleural effusion, left [J94.8] Atrial fibrillation with RVR ([place]) [I48.91]  DISCHARGE DIAGNOSIS:  Active Problems:   Atrial fibrillation with RVR ([place])   SECONDARY DIAGNOSIS:   Past Medical History  Diagnosis Date  . Vertigo   . Hypertension   . Diabetes [facility])     HOSPITAL COURSE:  75 year old female with history of diabetes, hypertension, hyperlipidemia presented to the hospital with nausea vomiting and noted to be in new onset fibrillation with RVR.  #1 atrial fibrillation with RVR- pt. Was started oral Cardizem and her HR have improved and are controlled now.  - CHADS score is over 4 and has been started on Eliquis and will cont.   - She was seen by cardiology and agreed with this management. Her echocardiogram showing normal ejection fraction of 55-60%. She will continue treatment as mentioned above and follow-up with cardiology as an outpatient.  # 2 elevated troponin- this was likely in the setting of demand ischemia from it fibrillation with RVR.  -Troponins did not trend upwards. She is currently chest pain-free. Echocardiogram showed normal ejection fraction with no regional wall motion abnormalities.  #3 hyperlipidemia-she will continue atorvastatin.  #4 hypertension-she will continue lisinopril.  #5 type 2 diabetes without complications-she will continue Levemir,Victoza upon discharge.   Patient was seen by physical therapy and recommended home health services and she was discharged with home health nursing, physical therapy, aid, social work.  DISCHARGE CONDITIONS:    Stable  CONSULTS OBTAINED:  Treatment Team:  Corey Skains, MD  DRUG ALLERGIES:  No Known Allergies  DISCHARGE MEDICATIONS:   Discharge Medication List as of 09/13/2015  1:03 PM    START taking these medications   Details  apixaban (ELIQUIS) 5 MG TABS tablet Take 1 tablet (5 mg total) by mouth 2 (two) times daily., Starting 09/13/2015, Until Discontinued, Print    diltiazem (CARDIZEM CD) 180 MG 24 hr capsule Take 1 capsule (180 mg total) by mouth daily., Starting 09/13/2015, Until Discontinued, Print      CONTINUE these medications which have NOT CHANGED   Details  Insulin Detemir (LEVEMIR FLEXTOUCH) 100 UNIT/ML Pen Inject 25 Units into the skin daily at 10 pm., Starting 02/23/2015, Until Discontinued, Normal    lisinopril (PRINIVIL,ZESTRIL) 5 MG tablet Take 1 tablet (5 mg total) by mouth daily., Starting 02/23/2015, Until Discontinued, Normal    VICTOZA 18 MG/3ML SOPN Inject 2.6 mLs into the skin every morning., Until Discontinued, Historical Med    atorvastatin (LIPITOR) 40 MG tablet Take 1 tablet (40 mg total) by mouth daily at 6 PM., Starting 02/23/2015, Until Discontinued, Normal      STOP taking these medications     diazepam (VALIUM) 5 MG tablet      insulin lispro (HUMALOG) 100 UNIT/ML injection      meclizine (ANTIVERT) 25 MG tablet      methylPREDNISolone (MEDROL DOSEPAK) 4 MG TBPK tablet      ondansetron (ZOFRAN) 4 MG tablet          DISCHARGE INSTRUCTIONS:   DIET:  Cardiac diet and Diabetic diet  DISCHARGE CONDITION:  Stable  ACTIVITY:  Activity as tolerated  OXYGEN:  Home Oxygen: No.   Oxygen Delivery: room air  DISCHARGE LOCATION:  Home with home health nursing, PT, Aid, social work.   If you experience worsening of your admission symptoms, develop shortness of breath, life threatening emergency, suicidal or homicidal thoughts you must seek medical attention immediately by calling 911 or calling your MD immediately  if symptoms less  severe.  You Must read complete instructions/literature along with all the possible adverse reactions/side effects for all the Medicines you take and that have been prescribed to you. Take any new Medicines after you have completely understood and accpet all the possible adverse reactions/side effects.   Please note  You were cared for by a hospitalist during your hospital stay. If you have any questions about your discharge medications or the care you received while you were in the hospital after you are discharged, you can call the unit and asked to speak with the hospitalist on call if the hospitalist that took care of you is not available. Once you are discharged, your primary care physician will handle any further medical issues. Please note that NO REFILLS for any discharge medications will be authorized once you are discharged, as it is imperative that you return to your primary care physician (or establish a relationship with a primary care physician if you do not have one) for your aftercare needs so that they can reassess your need for medications and monitor your lab values.     Today   No complaints presently. Remains in a. fibrillation but rate controlled.  VITAL SIGNS:  Blood pressure 145/55, pulse 79, temperature 97.4 F (36.3 C), temperature source Oral, resp. rate 18, height 5\' 7"  (1.702 m), weight 88.587 kg (195 lb 4.8 oz), SpO2 96 %.  I/O:   Intake/Output Summary (Last 24 hours) at 09/13/15 1534 Last data filed at 09/13/15 1052  Gross per 24 hour  Intake    480 ml  Output    250 ml  Net    230 ml    PHYSICAL EXAMINATION:    GENERAL: 75 y.o.-year-old patient lying in the bed in no acute distress.  EYES: Pupils equal, round, reactive to light and accommodation. No scleral icterus. Extraocular muscles intact.  HEENT: Head atraumatic, normocephalic. Oropharynx and nasopharynx clear.  NECK: Supple, no jugular venous distention. No thyroid enlargement, no  tenderness.  LUNGS: Normal breath sounds bilaterally, no wheezing, rales, rhonchi. No use of accessory muscles of respiration.  CARDIOVASCULAR: S1, S2 Irregular. No murmurs, rubs, or gallops.  ABDOMEN: Soft, nontender, nondistended. Bowel sounds present. No organomegaly or mass.  EXTREMITIES: No cyanosis, clubbing or edema b/l.  NEUROLOGIC: Cranial nerves II through XII are intact. No focal Motor or sensory deficits b/l.  PSYCHIATRIC: The patient is alert and oriented x 3.  SKIN: No obvious rash, lesion, or ulcer.   DATA REVIEW:   CBC  Recent Labs Lab 09/11/15 0033  WBC 8.8  HGB 14.2  HCT 41.9  PLT 221    Chemistries   Recent Labs Lab 09/11/15 0033  NA 141  K 3.8  CL 112*  CO2 24  GLUCOSE 143*  BUN 19  CREATININE 1.25*  CALCIUM 8.0*    Cardiac Enzymes  Recent Labs Lab 09/11/15 1604  TROPONINI 0.35*    Microbiology Results  Results for orders placed or performed during the hospital encounter of 02/20/15  Urine culture     Status: None   Collection Time: 02/22/15  4:01 PM  Result Value  Ref Range Status   Specimen Description URINE, CLEAN CATCH  Final   Special Requests Normal  Final   Culture   Final    MULTIPLE SPECIES PRESENT, SUGGEST RECOLLECTION IF CLINICALLY INDICATED   Report Status 02/24/2015 FINAL  Final    RADIOLOGY:  No results found.    Management plans discussed with the patient, family and they are in agreement.  CODE STATUS:     Code Status Orders        Start     Ordered   09/11/15 0357  Full code   Continuous     09/11/15 0356    Code Status History    Date Active Date Inactive Code Status Order ID Comments User Context   02/21/2015  4:58 AM 02/23/2015  5:24 PM Full Code [id number]  Juluis Mire, MD Inpatient      TOTAL TIME TAKING CARE OF THIS PATIENT: 40 minutes.    Henreitta Leber M.D on 09/13/2015 at 3:34 PM  Between 7am to 6pm - Pager - [phone]  After 6pm go to [email or web address] - password [email or web address]  [facility]  Office  [phone]  CC: Primary care physician; Lavera Guise, MD

## 2015-09-13 NOTE — Care Management Important Message (Signed)
Important Message  Patient Details  Name: Lisa Lowery MRN: [id number] Date of Birth: July 05, 1941   Medicare Important Message Given:  Yes    Juliann Pulse A Shamona Wirtz 09/13/2015, 10:08 AM

## 2015-11-10 ENCOUNTER — Encounter: Payer: Self-pay | Admitting: Emergency Medicine

## 2015-11-10 ENCOUNTER — Emergency Department: Payer: Medicare Other

## 2015-11-10 ENCOUNTER — Inpatient Hospital Stay
Admission: EM | Admit: 2015-11-10 | Discharge: 2015-11-24 | DRG: 291 | Disposition: A | Discharge status: Skilled Nursing Facility | Payer: Medicare Other | Attending: Internal Medicine | Admitting: Internal Medicine

## 2015-11-10 DIAGNOSIS — I132 Hypertensive heart and chronic kidney disease with heart failure and with stage 5 chronic kidney disease, or end stage renal disease: Principal | ICD-10-CM | POA: Diagnosis present

## 2015-11-10 DIAGNOSIS — Z79899 Other long term (current) drug therapy: Secondary | ICD-10-CM | POA: Diagnosis not present

## 2015-11-10 DIAGNOSIS — I252 Old myocardial infarction: Secondary | ICD-10-CM | POA: Diagnosis not present

## 2015-11-10 DIAGNOSIS — Z833 Family history of diabetes mellitus: Secondary | ICD-10-CM | POA: Diagnosis not present

## 2015-11-10 DIAGNOSIS — Z90710 Acquired absence of both cervix and uterus: Secondary | ICD-10-CM

## 2015-11-10 DIAGNOSIS — Z91120 Patient's intentional underdosing of medication regimen due to financial hardship: Secondary | ICD-10-CM

## 2015-11-10 DIAGNOSIS — E1122 Type 2 diabetes mellitus with diabetic chronic kidney disease: Secondary | ICD-10-CM | POA: Diagnosis present

## 2015-11-10 DIAGNOSIS — Z992 Dependence on renal dialysis: Secondary | ICD-10-CM

## 2015-11-10 DIAGNOSIS — N170 Acute kidney failure with tubular necrosis: Secondary | ICD-10-CM | POA: Diagnosis present

## 2015-11-10 DIAGNOSIS — R0902 Hypoxemia: Secondary | ICD-10-CM

## 2015-11-10 DIAGNOSIS — E875 Hyperkalemia: Secondary | ICD-10-CM | POA: Diagnosis present

## 2015-11-10 DIAGNOSIS — R7989 Other specified abnormal findings of blood chemistry: Secondary | ICD-10-CM | POA: Diagnosis present

## 2015-11-10 DIAGNOSIS — R748 Abnormal levels of other serum enzymes: Secondary | ICD-10-CM | POA: Diagnosis present

## 2015-11-10 DIAGNOSIS — T501X5A Adverse effect of loop [high-ceiling] diuretics, initial encounter: Secondary | ICD-10-CM | POA: Diagnosis present

## 2015-11-10 DIAGNOSIS — I959 Hypotension, unspecified: Secondary | ICD-10-CM | POA: Diagnosis present

## 2015-11-10 DIAGNOSIS — Z9889 Other specified postprocedural states: Secondary | ICD-10-CM

## 2015-11-10 DIAGNOSIS — N390 Urinary tract infection, site not specified: Secondary | ICD-10-CM | POA: Diagnosis present

## 2015-11-10 DIAGNOSIS — D472 Monoclonal gammopathy: Secondary | ICD-10-CM

## 2015-11-10 DIAGNOSIS — Z809 Family history of malignant neoplasm, unspecified: Secondary | ICD-10-CM

## 2015-11-10 DIAGNOSIS — I2510 Atherosclerotic heart disease of native coronary artery without angina pectoris: Secondary | ICD-10-CM | POA: Diagnosis present

## 2015-11-10 DIAGNOSIS — J948 Other specified pleural conditions: Secondary | ICD-10-CM | POA: Diagnosis present

## 2015-11-10 DIAGNOSIS — I5033 Acute on chronic diastolic (congestive) heart failure: Secondary | ICD-10-CM | POA: Diagnosis present

## 2015-11-10 DIAGNOSIS — N186 End stage renal disease: Secondary | ICD-10-CM | POA: Diagnosis present

## 2015-11-10 DIAGNOSIS — Z6834 Body mass index (BMI) 34.0-34.9, adult: Secondary | ICD-10-CM | POA: Diagnosis not present

## 2015-11-10 DIAGNOSIS — Z9119 Patient's noncompliance with other medical treatment and regimen: Secondary | ICD-10-CM | POA: Diagnosis not present

## 2015-11-10 DIAGNOSIS — Z9111 Patient's noncompliance with dietary regimen: Secondary | ICD-10-CM | POA: Diagnosis not present

## 2015-11-10 DIAGNOSIS — Z794 Long term (current) use of insulin: Secondary | ICD-10-CM | POA: Diagnosis not present

## 2015-11-10 DIAGNOSIS — E119 Type 2 diabetes mellitus without complications: Secondary | ICD-10-CM

## 2015-11-10 DIAGNOSIS — R1110 Vomiting, unspecified: Secondary | ICD-10-CM

## 2015-11-10 DIAGNOSIS — I872 Venous insufficiency (chronic) (peripheral): Secondary | ICD-10-CM | POA: Diagnosis present

## 2015-11-10 DIAGNOSIS — R809 Proteinuria, unspecified: Secondary | ICD-10-CM | POA: Diagnosis present

## 2015-11-10 DIAGNOSIS — I4891 Unspecified atrial fibrillation: Secondary | ICD-10-CM | POA: Diagnosis present

## 2015-11-10 DIAGNOSIS — E440 Moderate protein-calorie malnutrition: Secondary | ICD-10-CM | POA: Diagnosis present

## 2015-11-10 DIAGNOSIS — I10 Essential (primary) hypertension: Secondary | ICD-10-CM | POA: Diagnosis present

## 2015-11-10 DIAGNOSIS — J9621 Acute and chronic respiratory failure with hypoxia: Secondary | ICD-10-CM | POA: Diagnosis not present

## 2015-11-10 DIAGNOSIS — R34 Anuria and oliguria: Secondary | ICD-10-CM | POA: Diagnosis present

## 2015-11-10 DIAGNOSIS — K567 Ileus, unspecified: Secondary | ICD-10-CM | POA: Diagnosis present

## 2015-11-10 DIAGNOSIS — R778 Other specified abnormalities of plasma proteins: Secondary | ICD-10-CM | POA: Diagnosis present

## 2015-11-10 DIAGNOSIS — R531 Weakness: Secondary | ICD-10-CM | POA: Diagnosis not present

## 2015-11-10 DIAGNOSIS — J9600 Acute respiratory failure, unspecified whether with hypoxia or hypercapnia: Secondary | ICD-10-CM | POA: Diagnosis present

## 2015-11-10 DIAGNOSIS — J90 Pleural effusion, not elsewhere classified: Secondary | ICD-10-CM

## 2015-11-10 DIAGNOSIS — N183 Chronic kidney disease, stage 3 (moderate): Secondary | ICD-10-CM

## 2015-11-10 DIAGNOSIS — E8779 Other fluid overload: Secondary | ICD-10-CM | POA: Diagnosis not present

## 2015-11-10 HISTORY — DX: Unspecified atrial fibrillation: I48.91

## 2015-11-10 LAB — CBC WITH DIFFERENTIAL/PLATELET
BASOS ABS: 0.1 10*3/uL (ref 0–0.1)
EOS ABS: 0.1 10*3/uL (ref 0–0.7)
HEMATOCRIT: 38.3 % (ref 35.0–47.0)
Hemoglobin: 12.7 g/dL (ref 12.0–16.0)
Lymphocytes Relative: 19 %
Lymphs Abs: 1.5 10*3/uL (ref 1.0–3.6)
MCH: 29.7 pg (ref 26.0–34.0)
MCHC: 33.2 g/dL (ref 32.0–36.0)
MCV: 89.2 fL (ref 80.0–100.0)
MONO ABS: 0.8 10*3/uL (ref 0.2–0.9)
Neutro Abs: 5.3 10*3/uL (ref 1.4–6.5)
Neutrophils Relative %: 69 %
PLATELETS: 192 10*3/uL (ref 150–440)
RBC: 4.3 MIL/uL (ref 3.80–5.20)
RDW: 14 % (ref 11.5–14.5)
WBC: 7.6 10*3/uL (ref 3.6–11.0)

## 2015-11-10 LAB — COMPREHENSIVE METABOLIC PANEL
ALBUMIN: 1.4 g/dL — AB (ref 3.5–5.0)
ALK PHOS: 72 U/L (ref 38–126)
ALT: 38 U/L (ref 14–54)
AST: 64 U/L — ABNORMAL HIGH (ref 15–41)
Anion gap: 3 — ABNORMAL LOW (ref 5–15)
BILIRUBIN TOTAL: 0.6 mg/dL (ref 0.3–1.2)
BUN: 40 mg/dL — AB (ref 6–20)
CALCIUM: 7.4 mg/dL — AB (ref 8.9–10.3)
CO2: 23 mmol/L (ref 22–32)
CREATININE: 2.33 mg/dL — AB (ref 0.44–1.00)
Chloride: 111 mmol/L (ref 101–111)
GFR calc Af Amer: 22 mL/min — ABNORMAL LOW (ref >60)
GFR calc non Af Amer: 19 mL/min — ABNORMAL LOW (ref >60)
GLUCOSE: 144 mg/dL — AB (ref 65–99)
Potassium: 4.9 mmol/L (ref 3.5–5.1)
SODIUM: 137 mmol/L (ref 135–145)
TOTAL PROTEIN: 7.8 g/dL (ref 6.5–8.1)

## 2015-11-10 LAB — TROPONIN I: Troponin I: 0.08 ng/mL — ABNORMAL HIGH (ref <0.031)

## 2015-11-10 LAB — BRAIN NATRIURETIC PEPTIDE: B NATRIURETIC PEPTIDE 5: 277 pg/mL — AB (ref 0.0–100.0)

## 2015-11-10 MED ORDER — APIXABAN 5 MG PO TABS
5.0000 mg | ORAL_TABLET | Freq: Two times a day (BID) | ORAL | Status: DC
  Administered 2015-11-11: 5 mg via ORAL
  Filled 2015-11-10: qty 1

## 2015-11-10 MED ORDER — DILTIAZEM HCL ER COATED BEADS 180 MG PO CP24
180.0000 mg | ORAL_CAPSULE | Freq: Every day | ORAL | Status: DC
  Administered 2015-11-11: 180 mg via ORAL
  Filled 2015-11-10: qty 1

## 2015-11-10 MED ORDER — FUROSEMIDE 10 MG/ML IJ SOLN
40.0000 mg | Freq: Once | INTRAMUSCULAR | Status: AC
Start: 2015-11-10 — End: 2015-11-11
  Administered 2015-11-11: 40 mg via INTRAVENOUS
  Filled 2015-11-10: qty 4

## 2015-11-10 MED ORDER — INSULIN ASPART 100 UNIT/ML ~~LOC~~ SOLN
0.0000 [IU] | Freq: Three times a day (TID) | SUBCUTANEOUS | Status: DC
  Administered 2015-11-12 (×2): 2 [IU] via SUBCUTANEOUS
  Administered 2015-11-13 – 2015-11-14 (×2): 3 [IU] via SUBCUTANEOUS
  Administered 2015-11-15: 2 [IU] via SUBCUTANEOUS
  Filled 2015-11-10 (×2): qty 3
  Filled 2015-11-10 (×3): qty 2

## 2015-11-10 MED ORDER — HEPARIN SODIUM (PORCINE) 5000 UNIT/ML IJ SOLN: 5000.0000 [IU] | Freq: Three times a day (TID) | INTRAMUSCULAR | Status: DC

## 2015-11-10 MED ORDER — SODIUM CHLORIDE 0.9% FLUSH
3.0000 mL | Freq: Two times a day (BID) | INTRAVENOUS | Status: DC
  Administered 2015-11-11 – 2015-11-24 (×27): 3 mL via INTRAVENOUS

## 2015-11-10 MED ORDER — ASPIRIN 81 MG PO CHEW
324.0000 mg | CHEWABLE_TABLET | Freq: Once | ORAL | Status: AC
  Administered 2015-11-10: 324 mg via ORAL
  Filled 2015-11-10: qty 4

## 2015-11-10 MED ORDER — ASPIRIN EC 81 MG PO TBEC
81.0000 mg | DELAYED_RELEASE_TABLET | Freq: Every day | ORAL | Status: DC
  Administered 2015-11-11 – 2015-11-22 (×11): 81 mg via ORAL
  Filled 2015-11-10 (×11): qty 1

## 2015-11-10 NOTE — H&P (Signed)
[place] at [place] NAME: Lisa Lowery    MR#:  [id number]  DATE OF BIRTH:  [birth year]  DATE OF ADMISSION:  11/10/2015  PRIMARY CARE PHYSICIAN: No PCP Per Patient   REQUESTING/REFERRING PHYSICIAN: Laverle Patter  CHIEF COMPLAINT:   Chief Complaint  Patient presents with  . Leg Swelling   progressively worsening general weakness  HISTORY OF PRESENT ILLNESS:  Lisa Lowery  is a 75 y.o. female with history of hypertension, diabetes mellitus type 2, atrial fibrillation and non-STEMI on recent  admission in January 2017 presents with the complaints of generalized weakness with inability to move around which is progressive for the past few months since her discharge in February. She also is experiencing increasing leg swelling since then. Chronic shortness of breath present, denies any chest pain, palpitations, dizziness, focal weakness or numbness. Denies any recent fever, cough, nausea, vomiting, diarrhea, dysuria. Patient was supposed to take eliquis and Cardizem CD since his discharge, but was not taking because of non-affordability of medications. She is also not taking other medications on a regular basis. She was supposed to get established for primary care which she did not since her discharge.  Evaluation in the ED revealed stable vital signs and afebrile and room air O2 saturations are around 96%. Lab work revealed elevated creatinine of 2.3 which was 1.25 in January 2017. CBC WNL, BNP 272, troponin 0.08. Prior troponin in January was 0.35. Chest x-ray revealed increased left pleural effusion involving two thirds of the left lung and left lower lobe collapse. EKG atrial fib patient with ventricular of 1 13 bpm. Hospitalist service was consulted for further management.  At the current time patient is resting in bed and does not appear to be in any acute distress. She admits not taking her medications regularly because of financial  issues.  PAST MEDICAL HISTORY:   Past Medical History  Diagnosis Date  . Vertigo   . Hypertension   . Diabetes ([place])   . A-fib [facility])     PAST SURGICAL HISTORY:   Past Surgical History  Procedure Laterality Date  . Small intestine surgery    . Abdominal hysterectomy      SOCIAL HISTORY:   Social History  Substance Use Topics  . Smoking status: Never Smoker   . Smokeless tobacco: Never Used  . Alcohol Use: No    FAMILY HISTORY:   Family History  Problem Relation Age of Onset  . Cancer Mother   . Cancer Father   . Diabetes Sister     DRUG ALLERGIES:  No Known Allergies  REVIEW OF SYSTEMS:   Review of Systems  Constitutional: Positive for malaise/fatigue. Negative for fever and chills.  HENT: Negative.   Eyes: Negative.   Respiratory: Positive for shortness of breath (Chronic).   Cardiovascular: Negative.   Gastrointestinal: Negative.   Genitourinary: Negative.   Musculoskeletal: Negative.   Skin: Negative for itching and rash.  Neurological: Positive for weakness.  Endo/Heme/Allergies: Negative.   Psychiatric/Behavioral: Negative.     MEDICATIONS AT HOME:   Prior to Admission medications   Medication Sig Start Date End Date Taking? Authorizing Provider  Insulin Detemir (LEVEMIR FLEXTOUCH) 100 UNIT/ML Pen Inject 25 Units into the skin daily at 10 pm. Patient taking differently: Inject 14 Units into the skin daily at 10 pm.  02/23/15  Yes Theodoro Grist, MD  lisinopril (PRINIVIL,ZESTRIL) 2.5 MG tablet Take 1 tablet by mouth daily. 10/01/15  Yes Historical Provider, MD  VITAL SIGNS:  Blood pressure 126/79, pulse 113, temperature 97.7 F (36.5 C), temperature source Oral, resp. rate 18, height 5\' 7"  (1.702 m), weight 88.451 kg (195 lb), SpO2 96 %.  PHYSICAL EXAMINATION:  Physical Exam  Constitutional: She is oriented to person, place, and time. She appears well-developed and well-nourished.  HENT:  Head: Normocephalic and atraumatic.  Eyes:  Conjunctivae and EOM are normal. Pupils are equal, round, and reactive to light.  Neck: Normal range of motion. Neck supple.  Cardiovascular:  Irregular heart rate  Respiratory: No respiratory distress.  Diminished air entry left lower lung  GI: Soft. Bowel sounds are normal. She exhibits no distension. There is no tenderness.  Musculoskeletal: She exhibits edema (3+).  Neurological: She is alert and oriented to person, place, and time. She has normal reflexes. No cranial nerve deficit. Coordination normal.  Skin: Skin is warm. No rash noted. No erythema.  Psychiatric: She has a normal mood and affect. Her behavior is normal. Judgment and thought content normal.   LABORATORY PANEL:   CBC  Recent Labs Lab 11/10/15 2014  WBC 7.6  HGB 12.7  HCT 38.3  PLT 192   ------------------------------------------------------------------------------------------------------------------  Chemistries   Recent Labs Lab 11/10/15 2014  NA 137  K 4.9  CL 111  CO2 23  GLUCOSE 144*  BUN 40*  CREATININE 2.33*  CALCIUM 7.4*  AST 64*  ALT 38  ALKPHOS 72  BILITOT 0.6   ------------------------------------------------------------------------------------------------------------------  Cardiac Enzymes  Recent Labs Lab 11/10/15 2014  TROPONINI 0.08*   ------------------------------------------------------------------------------------------------------------------  RADIOLOGY:  Dg Chest Port 1 View  11/10/2015  CLINICAL DATA:  Swelling of both legs. Congestive heart failure. Shortness of breath. EXAM: PORTABLE CHEST 1 VIEW COMPARISON:  09/11/2015 FINDINGS: Pleural fluid on the left is increased, now filling 2/3 of the left hemi thorax. Associated compressive atelectasis of the left lower lobe. Small amount of pleural fluid is probably present on the right with mild right base atelectasis. Vascularity suggests venous hypertension. IMPRESSION: Enlargement of the left effusion, now occupying 2/3  of the left hemi thorax with left lower lobe collapse. Small amount of pleural fluid on the right with mild right base atelectasis. Electronically Signed   By: Nelson Chimes M.D.   On: 11/10/2015 20:44    EKG:   Orders placed or performed during the hospital encounter of 11/10/15  . EKG 12-Lead A. Fib with VR 113  . EKG 12-Lead  . ED EKG  . ED EKG    IMPRESSION AND PLAN:   1. Left pleural effusion. Increasing bilateral lower extremity edema over the past few months. History of diastolic congestive heart failure last echo in January 2017 ejection fraction of 55-60%. Patient noncompliant with medications secondary to non-affordability of meds. Acute on chronic Congestive Heart Failure secondary to medical noncompliance, rule out acute coronary event. Need further workup for evaluation of left pleural effusion. 2. Generalized weakness secondary to above. 3. Acute on ckd 2. Creatinine in January was 1.25, now 2.3. Rule out obstruction. 4. A. Fib with RVR. Patient not taking Cardizem CD and eliquis at home because of non-affordability. 5. Htn. Stable blood pressure. 6. DM 2, stable clinically. 7. Elevated troponin, chronic.  0 [id number] 0:0 [id number] 0t0o0 0t0e0l0e6m0e0t0r0y0,0 0c0y0c0l0e0 [id number] 0e0n0z0y67m0e0s0.0 0 0 0 0 0 0 0 0 0  0 [id number] 0400 mg IV 1. Continue ACE inhibitor, aspirin. Request 2-D echocardiogram. Cardiology consult requested for further advice.           Pulmonary consultation requested for further evaluation  of left-sided pleural effusion.           Renal ultrasound requested to rule out obstructive causes. Nephrology consultation requested for further evaluation. Follow BMP closely.           Start Cardizem CD and eliquis.           Sliding scale insulin, follow up blood sugars closely.          DVT prophylaxis : Heparin. Code status : Full code.       All the records are reviewed and case discussed with ED provider. Management plans  discussed with the patient, family and they are in agreement.  CODE STATUS: Full code  TOTAL TIME TAKING CARE OF THIS PATIENT: 50 minutes.    Juluis Mire M.D on 11/10/2015 at 10:28 PM  Between 7am to 6pm - Pager - [phone]  After 6pm go to [email or web address] - password [email or web address] [facility]  [place] [facility]  Office  [phone]  CC: Primary care physician; No PCP Per Patient

## 2015-11-10 NOTE — Progress Notes (Signed)
ANTICOAGULATION CONSULT NOTE - Initial Consult  Pharmacy Consult for Eliquis Indication: atrial fibrillation  No Known Allergies  Patient Measurements: Height: 5\' 7"  (170.2 cm) Weight: 195 lb (88.451 kg) IBW/kg (Calculated) : 61.6 Heparin Dosing Weight:   Vital Signs: Temp: 97.7 F (36.5 C) (03/29 2016) Temp Source: Oral (03/29 2016) BP: 126/79 mmHg (03/29 2200) Pulse Rate: 113 (03/29 2200)  Labs:  Recent Labs  11/10/15 2014  HGB 12.7  HCT 38.3  PLT 192  CREATININE 2.33*  TROPONINI 0.08*    Estimated Creatinine Clearance: 23.5 mL/min (by C-G formula based on Cr of 2.33).   Medical History: Past Medical History  Diagnosis Date  . Vertigo   . Hypertension   . Diabetes ([place])   . A-fib (HCC)     Medications:  Infusions:    Assessment: 74 yof cc leg swelling. Recently admitted (January 2017) and diagnosed with AF. She was to start Eliquis and Cardizem but has not due to concerns of cost. Pharmacy consulted to dose Eliquis for AF.   Goal of Therapy:   Monitor platelets by anticoagulation protocol: Yes   Plan:  Apixaban 5 mg po BID.   Laural Benes, Pharm.D., BCPS Clinical Pharmacist 11/10/2015,10:37 PM

## 2015-11-10 NOTE — ED Notes (Signed)
Pt states she does not have to void at current time.

## 2015-11-10 NOTE — ED Notes (Addendum)
Pt comes into the ED via EMS c/o swelling to bilateral legs and difficulty walking. Patient is from home with multiple comorbidity including:  A-fib, DM, HTN and is being watched for CHF.  Patient in NAD at this time.  Complains of mild shortness of breath with exertion. EKG showing a-fib HR between 115-80 with EMS.  Patient states she is supposed to be having home health come to her on a regular basis to check on her and make sure she is taking medications properly.  Explained that they have not been there in a month and she has been off her medicine for 3 days.

## 2015-11-10 NOTE — ED Provider Notes (Signed)
[facility] Emergency Department Provider Note ____________________________________________  Time seen: Approximately 8:10 PM  I have reviewed the triage vital signs and the nursing notes.   HISTORY  Chief Complaint Leg Swelling  History provided by both patient and EMS  HPI Lisa Lowery is a 74 y.o. female with a history of diabetes diagnosed in July and more recent diagnosis of A. fib who called EMS from home because she was unable to get off the toilet due to weakness. She states she has become progressively weaker over the past few months and has great difficulty getting around her house. He is also short of breath all the time and has been this way for several months. She denies chest pain or discomfort.   She lives with her son but he is "almost always gone"; she was supposed to get home health nursing a month and a half ago but the nurse assigned to her is no longer employed at [employer] and no one else has been assigned. She noted significant lower extremity edema extending up to her abdomen and left breasts so she decided to stop her Victoza 3 days ago and has had some improvement in edema since but it is still significant and bilateral legs. She states they are not painful but do feel heavy.  She states she has been trying to eat and drink some that has not been drinking as well as she should. She denies any recent fevers, cough, vomiting, diarrhea.  She was admitted to the hospital in January with an NSTEMI that was attributed to probable demand ischemia, new onset A. fib and was prescribed Cardizem and eloquent switch she did not fill because she was concerned they would be too expensive.  She was admitted to the hospital in July 2016 for vertigo and was diagnosed with diabetes. Prior to that time she did not have any acute medical problems.  Past Medical History  Diagnosis Date  . Vertigo   . Hypertension   . Diabetes ([place])   . A-fib [facility])      Patient Active Problem List   Diagnosis Date Noted  . Atrial fibrillation with RVR ([place]) 09/11/2015  . Essential hypertension, malignant 02/23/2015  . Renal insufficiency 02/23/2015  . DM (diabetes mellitus) type 2, uncontrolled, with ketoacidosis ([place]) 02/23/2015  . Dizziness 02/21/2015  . Elevated troponin 02/21/2015  . HTN (hypertension) 02/21/2015  . Elevated blood sugar 02/21/2015    Past Surgical History  Procedure Laterality Date  . Small intestine surgery    . Abdominal hysterectomy      Current Outpatient Rx  Name  Route  Sig  Dispense  Refill  . apixaban (ELIQUIS) 5 MG TABS tablet   Oral   Take 1 tablet (5 mg total) by mouth 2 (two) times daily.   60 tablet   1   . atorvastatin (LIPITOR) 40 MG tablet   Oral   Take 1 tablet (40 mg total) by mouth daily at 6 PM. Patient not taking: Reported on 09/11/2015   30 tablet   2   . diltiazem (CARDIZEM CD) 180 MG 24 hr capsule   Oral   Take 1 capsule (180 mg total) by mouth daily.   60 capsule   1   . Insulin Detemir (LEVEMIR FLEXTOUCH) 100 UNIT/ML Pen   Subcutaneous   Inject 25 Units into the skin daily at 10 pm. Patient taking differently: Inject 14 Units into the skin daily at 10 pm.    15 mL  11   . lisinopril (PRINIVIL,ZESTRIL) 5 MG tablet   Oral   Take 1 tablet (5 mg total) by mouth daily.   30 tablet   3   . VICTOZA 18 MG/3ML SOPN   Subcutaneous   Inject 2.6 mLs into the skin every morning.           Dispense as written.     Allergies Review of patient's allergies indicates no known allergies.  Family History  Problem Relation Age of Onset  . Cancer Mother   . Cancer Father   . Diabetes Sister     Social History Social History  Substance Use Topics  . Smoking status: Never Smoker   . Smokeless tobacco: Never Used  . Alcohol Use: No    Review of Systems Constitutional: No fever/chills Cardiovascular: Denies chest pain. Respiratory: + shortness of breath. Gastrointestinal:  No abdominal pain.  No nausea, no vomiting.  No diarrhea.  No constipation. Musculoskeletal: Negative for back pain. Skin: Negative for rash. + BLE edema  10-point ROS otherwise negative.  ____________________________________________   PHYSICAL EXAM:  VITAL SIGNS: ED Triage Vitals  Enc Vitals Group     BP 11/10/15 2016 128/69 mmHg     Pulse Rate 11/10/15 2016 112     Resp 11/10/15 2016 20     Temp 11/10/15 2016 97.7 F (36.5 C)     Temp Source 11/10/15 2016 Oral     SpO2 11/10/15 2016 96 %     Weight --      Height --      Head Cir --      Peak Flow --      Pain Score --      Pain Loc --      Pain Edu? --      Excl. in [place]? --    Constitutional: Alert and oriented. Mild-mod tachypneic Eyes: Conjunctivae are normal. PERRL. EOMI. Head: Atraumatic. Nose: No congestion/rhinnorhea. Mouth/Throat: Mucous membranes are dry; poor dentition.  Oropharynx non-erythematous. Neck: No stridor.   Cardiovascular: irregularly irregular rhythm. Grossly normal heart sounds.  Good peripheral circulation. Respiratory: Normal respiratory effort.  No retractions. Lungs CTAB. Gastrointestinal: Soft and nontender. No distention. No abdominal bruits. No CVA tenderness. Musculoskeletal: 1-2+ pitting edema B LE to thighs without TTP or overlying erythema Neurologic:  Normal speech and language. No gross focal neurologic deficits are appreciated.  Skin:  Skin is warm, dry and intact. No rash noted. Psychiatric: Mood and affect are normal. Speech and behavior are normal.  ____________________________________________   LABS (all labs ordered are listed, but only abnormal results are displayed)  Labs Reviewed  COMPREHENSIVE METABOLIC PANEL - Abnormal; Notable for the following:    Glucose, Bld 144 (*)    BUN 40 (*)    Creatinine, Ser 2.33 (*)    Calcium 7.4 (*)    Albumin 1.4 (*)    AST 64 (*)    GFR calc non Af Amer 19 (*)    GFR calc Af Amer 22 (*)    Anion gap 3 (*)    All other  components within normal limits  BRAIN NATRIURETIC PEPTIDE - Abnormal; Notable for the following:    B Natriuretic Peptide 277.0 (*)    All other components within normal limits  TROPONIN I - Abnormal; Notable for the following:    Troponin I 0.08 (*)    All other components within normal limits  CBC WITH DIFFERENTIAL/PLATELET  URINALYSIS COMPLETEWITH MICROSCOPIC ([facility] ONLY)   ____________________________________________  EKG  ED ECG REPORT I, Ponciano Ort, the attending physician, personally viewed and interpreted this ECG.   Date: 11/10/2015  EKG Time: 2010  Rate: 113  Rhythm:afib  Axis: nl  Intervals:nl  ST&T Change: T flat I, II, III, avF, V5-6  ____________________________________________  RADIOLOGY  cxr-  IMPRESSION: Enlargement of the left effusion, now occupying 2/3 of the left hemi thorax with left lower lobe collapse. Small amount of pleural fluid on the right with mild right base atelectasis.   Electronically Signed By: Nelson Chimes M.D. On: 11/10/2015 20:44 ____________________________________________   INITIAL IMPRESSION / ASSESSMENT AND PLAN / ED COURSE  Pertinent labs & imaging results that were available during my care of the patient were reviewed by me and considered in my medical decision making (see chart for details).   A shunt will be admitted to the hospital for acute dyspnea related to significantly worsened left pleural effusion that will require further workup. Patient appears to be intravascularly depleted although she is third spacing fluids with bilateral peripheral edema. ____________________________________________   FINAL CLINICAL IMPRESSION(S) / ED DIAGNOSES  Final diagnoses:  Pleural effusion on left  Atrial fibrillation with RVR ([place])      Ponciano Ort, MD 11/10/15 2114

## 2015-11-11 ENCOUNTER — Inpatient Hospital Stay: Payer: Medicare Other

## 2015-11-11 DIAGNOSIS — R531 Weakness: Secondary | ICD-10-CM

## 2015-11-11 DIAGNOSIS — I4891 Unspecified atrial fibrillation: Secondary | ICD-10-CM

## 2015-11-11 LAB — URINALYSIS COMPLETE WITH MICROSCOPIC (ARMC ONLY)
BILIRUBIN URINE: NEGATIVE
GLUCOSE, UA: NEGATIVE mg/dL
KETONES UR: NEGATIVE mg/dL
NITRITE: NEGATIVE
SPECIFIC GRAVITY, URINE: 1.015 (ref 1.005–1.030)
pH: 5 (ref 5.0–8.0)

## 2015-11-11 LAB — BODY FLUID CELL COUNT WITH DIFFERENTIAL
Eos, Fluid: 0 %
LYMPHS FL: 29 %
Monocyte-Macrophage-Serous Fluid: 5 %
Neutrophil Count, Fluid: 66 %
Other Cells, Fluid: 0 %
Total Nucleated Cell Count, Fluid: 167 cu mm

## 2015-11-11 LAB — TROPONIN I
TROPONIN I: 0.08 ng/mL — AB (ref <0.031)
Troponin I: 0.11 ng/mL — ABNORMAL HIGH (ref <0.031)
Troponin I: 0.08 ng/mL — ABNORMAL HIGH (ref <0.031)

## 2015-11-11 LAB — LACTATE DEHYDROGENASE, PLEURAL OR PERITONEAL FLUID: LD, Fluid: 56 U/L — ABNORMAL HIGH (ref 3–23)

## 2015-11-11 LAB — PROTEIN, BODY FLUID: Total protein, fluid: <3 g/dL

## 2015-11-11 LAB — GLUCOSE, CAPILLARY
GLUCOSE-CAPILLARY: 105 mg/dL — AB (ref 65–99)
Glucose-Capillary: 91 mg/dL (ref 65–99)
Glucose-Capillary: 110 mg/dL — ABNORMAL HIGH (ref 65–99)
Glucose-Capillary: 99 mg/dL (ref 65–99)
Glucose-Capillary: 110 mg/dL — ABNORMAL HIGH (ref 65–99)

## 2015-11-11 MED ORDER — FUROSEMIDE 10 MG/ML IJ SOLN
40.0000 mg | Freq: Two times a day (BID) | INTRAMUSCULAR | Status: DC
  Administered 2015-11-11 (×2): 40 mg via INTRAVENOUS
  Filled 2015-11-11 (×2): qty 4

## 2015-11-11 MED ORDER — DEXTROSE 5 % IV SOLN
1.0000 g | INTRAVENOUS | Status: DC
  Administered 2015-11-11 – 2015-11-14 (×4): 1 g via INTRAVENOUS
  Filled 2015-11-11 (×6): qty 10

## 2015-11-11 MED ORDER — METOPROLOL TARTRATE 25 MG PO TABS
25.0000 mg | ORAL_TABLET | Freq: Two times a day (BID) | ORAL | Status: DC
  Administered 2015-11-11 – 2015-11-18 (×14): 25 mg via ORAL
  Filled 2015-11-11 (×15): qty 1

## 2015-11-11 NOTE — Progress Notes (Signed)
Patient stated that her blood sugar may be low. Nurse checked patient's blood sugar 91. Patient stated usually when she starts to shake her BS is dropping. Nurse provided patient with 4 oz of orange juice will continue to monitor.

## 2015-11-11 NOTE — Consult Note (Signed)
[facility]  CONSULT NOTE    Date: 11/11/2015                  Patient Name:  Lisa Lowery  MRN: [id number]  DOB: 1941/02/11  Age / Sex: 75 y.o., female         PCP: No PCP Per Patient                 Service Requesting Consult: Dr. Darvin Neighbours                 Reason for Consult: Acute renal failure on chronic kidney disease stage III            History of Present Illness: Lisa Lowery is a 75 y.o. white female with atrial fibrillation, diabetes mellitus type II, hypertension, vertigo, coronary artery disease, who was admitted to [facility] on 11/10/2015 for Pleural effusion on left [J94.8] Atrial fibrillation with RVR (HCC) [I48.91] CKD (chronic kidney disease), stage 3 (moderate) [N18.3]  Patient underwent left thoracentesis ultrasound guided with 1.7 litres removed.   Creatinine on admission of 2.33, from baseline of 1.25, eGFR of 41. She has not been taking any diuretics.    Medications: Outpatient medications: Prescriptions prior to admission  Medication Sig Dispense Refill Last Dose  . Insulin Detemir (LEVEMIR FLEXTOUCH) 100 UNIT/ML Pen Inject 25 Units into the skin daily at 10 pm. (Patient taking differently: Inject 14 Units into the skin daily at 10 pm. ) 15 mL 11 11/09/2015 at Unknown time  . lisinopril (PRINIVIL,ZESTRIL) 2.5 MG tablet Take 1 tablet by mouth daily.   11/10/2015 at Unknown time    Current medications: Current Facility-Administered Medications  Medication Dose Route Frequency Provider Last Rate Last Dose  . aspirin EC tablet 81 mg  81 mg Oral Daily Juluis Mire, MD   81 mg at 11/11/15 1241  . cefTRIAXone (ROCEPHIN) 1 g in dextrose 5 % 50 mL IVPB  1 g Intravenous Q24H Hillary Bow, MD   1 g at 11/11/15 1242  . diltiazem (CARDIZEM CD) 24 hr capsule 180 mg  180 mg Oral Daily Juluis Mire, MD   180 mg at 11/11/15 1241  . furosemide (LASIX) injection 40 mg  40 mg Intravenous BID Hillary Bow, MD   40 mg at 11/11/15 1241  . insulin  aspart (novoLOG) injection 0-15 Units  0-15 Units Subcutaneous TID WC Juluis Mire, MD   0 Units at 11/11/15 0809  . sodium chloride flush (NS) 0.9 % injection 3 mL  3 mL Intravenous Q12H Juluis Mire, MD   3 mL at 11/11/15 1243      Allergies: No Known Allergies    Past Medical History: Past Medical History  Diagnosis Date  . Vertigo   . Hypertension   . Diabetes ([place])   . A-fib [facility])      Past Surgical History: Past Surgical History  Procedure Laterality Date  . Small intestine surgery    . Abdominal hysterectomy       Family History: Family History  Problem Relation Age of Onset  . Cancer Mother   . Cancer Father   . Diabetes Sister      Social History: Social History   Social History  . Marital Status: Divorced    Spouse Name: N/A  . Number of Children: N/A  . Years of Education: N/A   Occupational History  . Not on file.   Social History Main Topics  . Smoking status: Never  Smoker   . Smokeless tobacco: Never Used  . Alcohol Use: No  . Drug Use: No  . Sexual Activity: No   Other Topics Concern  . Not on file   Social History Narrative     Review of Systems: Review of Systems  Constitutional: Negative for fever, chills, weight loss, malaise/fatigue and diaphoresis.  HENT: Negative for congestion, ear discharge, ear pain, hearing loss, nosebleeds, sore throat and tinnitus.   Eyes: Negative for blurred vision, double vision, photophobia, pain, discharge and redness.  Respiratory: Positive for cough, hemoptysis, sputum production, shortness of breath and wheezing. Negative for stridor.   Cardiovascular: Positive for leg swelling. Negative for chest pain, palpitations, orthopnea, claudication and PND.  Gastrointestinal: Negative for heartburn, nausea, vomiting, abdominal pain, diarrhea, constipation, blood in stool and melena.  Genitourinary: Negative for dysuria, urgency, frequency, hematuria and flank pain.  Musculoskeletal: Negative  for myalgias, back pain, joint pain, falls and neck pain.  Skin: Negative for itching and rash.  Neurological: Negative for dizziness, tingling, tremors, sensory change, speech change, focal weakness, seizures, loss of consciousness, weakness and headaches.  Endo/Heme/Allergies: Negative for environmental allergies and polydipsia. Does not bruise/bleed easily.  Psychiatric/Behavioral: Negative.  Negative for depression, suicidal ideas, hallucinations, memory loss and substance abuse. The patient is not nervous/anxious and does not have insomnia.     Vital Signs: Blood pressure 148/61, pulse 101, temperature 98 F (36.7 C), temperature source Oral, resp. rate 18, height 5' 7"  (1.702 m), weight 100.88 kg (222 lb 6.4 oz), SpO2 98 %.  Weight trends: Filed Weights   11/10/15 2005 11/11/15 0036 11/11/15 0429  Weight: 88.451 kg (195 lb) 101.197 kg (223 lb 1.6 oz) 100.88 kg (222 lb 6.4 oz)    Physical Exam: General: NAD, laying in bed  Head: Normocephalic, atraumatic. Moist oral mucosal membranes  Eyes: Anicteric, PERRL  Neck: Supple, trachea midline  Lungs:  Bilateral crackles and wheezes  Heart: Regular rate and rhythm  Abdomen:  Soft, nontender,   Extremities:  +++ peripheral edema.  Neurologic: Nonfocal, moving all four extremities  Skin: No lesions        Lab results: Basic Metabolic Panel:  Recent Labs Lab 11/10/15 2014  NA 137  K 4.9  CL 111  CO2 23  GLUCOSE 144*  BUN 40*  CREATININE 2.33*  CALCIUM 7.4*    Liver Function Tests:  Recent Labs Lab 11/10/15 2014  AST 64*  ALT 38  ALKPHOS 72  BILITOT 0.6  PROT 7.8  ALBUMIN 1.4*   No results for input(s): LIPASE, AMYLASE in the last 168 hours. No results for input(s): AMMONIA in the last 168 hours.  CBC:  Recent Labs Lab 11/10/15 2014  WBC 7.6  NEUTROABS 5.3  HGB 12.7  HCT 38.3  MCV 89.2  PLT 192    Cardiac Enzymes:  Recent Labs Lab 11/10/15 2014 11/11/15 0017 11/11/15 0406 11/11/15 1226   TROPONINI 0.08* 0.08* 0.11* 0.08*    BNP: Invalid input(s): POCBNP  CBG:  Recent Labs Lab 11/11/15 0649 11/11/15 0754 11/11/15 1255  GLUCAP 91 110* 105*    Microbiology: Results for orders placed or performed during the hospital encounter of 02/20/15  Urine culture     Status: None   Collection Time: 02/22/15  4:01 PM  Result Value Ref Range Status   Specimen Description URINE, CLEAN CATCH  Final   Special Requests Normal  Final   Culture   Final    MULTIPLE SPECIES PRESENT, SUGGEST RECOLLECTION IF CLINICALLY INDICATED  Report Status 02/24/2015 FINAL  Final    Coagulation Studies: No results for input(s): LABPROT, INR in the last 72 hours.  Urinalysis:  Recent Labs  11/11/15 0344  COLORURINE YELLOW*  LABSPEC 1.015  PHURINE 5.0  GLUCOSEU NEGATIVE  HGBUR 2+*  BILIRUBINUR NEGATIVE  KETONESUR NEGATIVE  PROTEINUR >500*  NITRITE NEGATIVE  LEUKOCYTESUR TRACE*      Imaging: US Renal  11/11/2015  CLINICAL DATA:  Chronic renal disease. EXAM: RENAL / URINARY TRACT ULTRASOUND COMPLETE COMPARISON:  None. FINDINGS: Right Kidney: Length: 12.6 cm. Normal renal cortical thickness and echogenicity. No hydronephrosis. There is a bilobed cyst within the renal pelvis measuring 3.9 x 3.0 x 3.7 cm Left Kidney: Length: 12.4 cm. Normal renal cortical thickness and echogenicity. No hydronephrosis. There is a 2.1 x 1.8 x 2.4 cm cyst within the interpolar region of the left kidney. Bladder: Appears normal for degree of bladder distention. IMPRESSION: No hydronephrosis. Bilateral renal cysts. Electronically Signed   By: Lovey Newcomer M.D.   On: 11/11/2015 11:06   Dg Chest Port 1 View  11/10/2015  CLINICAL DATA:  Swelling of both legs. Congestive heart failure. Shortness of breath. EXAM: PORTABLE CHEST 1 VIEW COMPARISON:  09/11/2015 FINDINGS: Pleural fluid on the left is increased, now filling 2/3 of the left hemi thorax. Associated compressive atelectasis of the left lower lobe. Small  amount of pleural fluid is probably present on the right with mild right base atelectasis. Vascularity suggests venous hypertension. IMPRESSION: Enlargement of the left effusion, now occupying 2/3 of the left hemi thorax with left lower lobe collapse. Small amount of pleural fluid on the right with mild right base atelectasis. Electronically Signed   By: Nelson Chimes M.D.   On: 11/10/2015 20:44      Assessment & Plan: [name] is a 75 y.o. white female with atrial fibrillation, diabetes mellitus type II, hypertension, vertigo, coronary artery disease, who was admitted to [facility] on 11/10/2015 for Pleural effusion on left [J94.8] Atrial fibrillation with RVR (HCC) [I48.91] CKD (chronic kidney disease), stage 3 (moderate) [N18.3]  1. Acute renal failure on chronic kidney disease stage III with proteinuria: baseline creatinine 1.25 eGFR 41 from 09/11/15. Acute renal failure from acute cardiorenal syndrome. Recent cardiac procedure. Not on diuretics or low salt diet at home. - hold lisinopril - furosemide IV 65m q12  2. Hypertension: with atrial fibrillation. Significant peripheral edema. Echo from 1/17 without CHF.  - diltiazem for rate control.  - furosemide as above.   3. Diabetes mellitus type II Insulin dependent with chronic kidney disease: hemoglobin A1c from 09/11/15 5.1%     LOS: 1[place]3/30/20171:23 PM

## 2015-11-11 NOTE — Care Management (Signed)
Patient was evaluated by this CM jan 2017.  At that time, made referral to [facility] for AN PT  SW and agency is still following patient.  Patient has not intervened on her own behalf to get her [place] transferred to [facility].  Received instruction from the home health social worker but patient did not follow through. It is verbally reported that she is not taking her medications due to cost.  Patient tells CM she has a full bottle of Eliquis and never took them because of a commercial she saw on television. Says that her home health therapist suggested she call her doctor to discuss her concerns.  Spoke with liasion for [facility] to discuss how could home health nursing be seeing patient and patient not taking any of her medications.  Informed that the home health nurse has been terminated for not making home health visits and most likely this patient ws not getting seen.  Reviewed all of CM concerns- housing- unable to navigate stairs to second level, medication compliance,[place]  medicaid, patient may benefit from [employer] as she can not ambulate to get out of the house have concerns regarding the amount of support patient receives from her son.  Patient had originally planned to go to [place] to live but "that is not gong to happen now.". PT and OT consults are pending.  There is concern that patient is going to require short term skilled nursing placement. This patient is not able to carry out any sort of plan or application process on her own.

## 2015-11-11 NOTE — Progress Notes (Signed)
[facility] [place] NAME: Lilybelle Stasiak    MR#:  [id number]  DATE OF BIRTH:  October 25, 1940  SUBJECTIVE:  CHIEF COMPLAINT:   Chief Complaint  Patient presents with  . Leg Swelling   Shortness of breath has improved after thoracentesis. Feels dizzy. No chest pain. Left thoracentesis done and 1.7 L fluid removal  REVIEW OF SYSTEMS:    Review of Systems  Constitutional: Positive for malaise/fatigue. Negative for fever and chills.  HENT: Negative for sore throat.   Eyes: Negative for blurred vision, double vision and pain.  Respiratory: Positive for cough and shortness of breath. Negative for hemoptysis and wheezing.   Cardiovascular: Positive for orthopnea and leg swelling. Negative for chest pain and palpitations.  Gastrointestinal: Negative for heartburn, nausea, vomiting, abdominal pain, diarrhea and constipation.  Genitourinary: Negative for dysuria and hematuria.  Musculoskeletal: Negative for back pain and joint pain.  Skin: Negative for rash.  Neurological: Positive for dizziness and weakness. Negative for sensory change, speech change, focal weakness and headaches.  Endo/Heme/Allergies: Does not bruise/bleed easily.  Psychiatric/Behavioral: Negative for depression. The patient is not nervous/anxious.     DRUG ALLERGIES:  No Known Allergies  VITALS:  Blood pressure 148/61, pulse 101, temperature 98 F (36.7 C), temperature source Oral, resp. rate 18, height 5\' 7"  (1.702 m), weight 100.88 kg (222 lb 6.4 oz), SpO2 98 %.  PHYSICAL EXAMINATION:   Physical Exam  GENERAL:  75 y.o.-year-old patient lying in the bed with no acute distress.  EYES: Pupils equal, round, reactive to light and accommodation. No scleral icterus. Extraocular muscles intact.  HEENT: Head atraumatic, normocephalic. Oropharynx and nasopharynx clear.  NECK:  Supple, no jugular venous distention. No thyroid enlargement, no tenderness.  LUNGS: Bilateral  crackles. Decreased air entry at the bases. CARDIOVASCULAR: S1, S2 normal. No murmurs, rubs, or gallops.  ABDOMEN: Soft, nontender, nondistended. Bowel sounds present. No organomegaly or mass.  EXTREMITIES: No cyanosis, clubbing. 2+ lower extremity edema NEUROLOGIC: Cranial nerves II through XII are intact. No focal Motor or sensory deficits b/l.   PSYCHIATRIC: The patient is alert and oriented x 3.  SKIN: No obvious rash, lesion, or ulcer.   LABORATORY PANEL:   CBC  Recent Labs Lab 11/10/15 2014  WBC 7.6  HGB 12.7  HCT 38.3  PLT 192   ------------------------------------------------------------------------------------------------------------------ Chemistries   Recent Labs Lab 11/10/15 2014  NA 137  K 4.9  CL 111  CO2 23  GLUCOSE 144*  BUN 40*  CREATININE 2.33*  CALCIUM 7.4*  AST 64*  ALT 38  ALKPHOS 72  BILITOT 0.6   ------------------------------------------------------------------------------------------------------------------  Cardiac Enzymes  Recent Labs Lab 11/11/15 1226  TROPONINI 0.08*   ------------------------------------------------------------------------------------------------------------------  RADIOLOGY:  US Renal  11/11/2015  CLINICAL DATA:  Chronic renal disease. EXAM: RENAL / URINARY TRACT ULTRASOUND COMPLETE COMPARISON:  None. FINDINGS: Right Kidney: Length: 12.6 cm. Normal renal cortical thickness and echogenicity. No hydronephrosis. There is a bilobed cyst within the renal pelvis measuring 3.9 x 3.0 x 3.7 cm Left Kidney: Length: 12.4 cm. Normal renal cortical thickness and echogenicity. No hydronephrosis. There is a 2.1 x 1.8 x 2.4 cm cyst within the interpolar region of the left kidney. Bladder: Appears normal for degree of bladder distention. IMPRESSION: No hydronephrosis. Bilateral renal cysts. Electronically Signed   By: Lovey Newcomer M.D.   On: 11/11/2015 11:06   Dg Chest Port 1 View  11/10/2015  CLINICAL DATA:  Swelling of both legs.  Congestive heart failure.  Shortness of breath. EXAM: PORTABLE CHEST 1 VIEW COMPARISON:  09/11/2015 FINDINGS: Pleural fluid on the left is increased, now filling 2/3 of the left hemi thorax. Associated compressive atelectasis of the left lower lobe. Small amount of pleural fluid is probably present on the right with mild right base atelectasis. Vascularity suggests venous hypertension. IMPRESSION: Enlargement of the left effusion, now occupying 2/3 of the left hemi thorax with left lower lobe collapse. Small amount of pleural fluid on the right with mild right base atelectasis. Electronically Signed   By: Nelson Chimes M.D.   On: 11/10/2015 20:44     ASSESSMENT AND PLAN:   * Bilateral pleural effusions left greater than right likely due to congestive heart failure On IV Lasix Status post left thoracentesis with 1.7 L. Initial labs suggested transudate. Await for final cultures.  * Acute on chronic diastolic congestive heart failure with acute respiratory failure - IV Lasix, Beta blockers - Input and Output - Counseled to limit fluids and Salt - Monitor Bun/Cr and Potassium -Cardiology follow up  * Acute renal failure due to likely cardiorenal syndrome Continue IV Lasix and monitor. Appreciate nephrology input.  * A. Fib with RVR Restart Cardizem from home. Add metoprolol 25 mg twice a day.  * UTI On IV ceftriaxone  * Hypertension On Cardizem and metoprolol along with Lasix  * Diabetes mellitus Sliding scale insulin  * Chronic mild elevation of troponin is stable  All the records are reviewed and case discussed with Care Management/Social Workerr. Management plans discussed with the patient, family and they are in agreement.  CODE STATUS: FULL CODE  DVT Prophylaxis: SCDs  TOTAL TIME TAKING CARE OF THIS PATIENT: 35 minutes.   POSSIBLE D/C IN 2-3 DAYS, DEPENDING ON CLINICAL CONDITION.  Hillary Bow R M.D on 11/11/2015 at 2:38 PM  Between 7am to 6pm - Pager -  [phone]  After 6pm go to [email or web address] - password [email or web address] [facility]  [facility]  Office  [phone]  CC: Primary care physician; No PCP Per Patient  Note: This dictation was prepared with Dragon dictation along with smaller phrase technology. Any transcriptional errors that result from this process are unintentional.

## 2015-11-11 NOTE — Consult Note (Signed)
[facility] Consultation      Assessment and Plan:  75 yo female presented with progressive dyspnea and anasarca, found to have left pleural effusion, s/p thora on 3/30 with removal of 1.7 of normal appearing fluid.   Left pleural effusion. -status post left thoracentesis with drainage of 1.7 L,thus far serology on this fluid is consistent with transudative pleural effusion-likely consistent with volume overload. --Await results of further pleural fluid studies. -continue diuresis to help with volume overload.   Acute respiratory failure.  --Secondary to above.   Anasarca with volume overload. --continue workup of potential renal failure,congestive heart failure. -cardiology and nephrology egarding been consulted. --Continue treatment of underlying cause of volume overload, as the pleural effusion is likely to recur.    Date: 11/11/2015  MRN# [id number] Lisa Lowery 1941/03/02  Referring Physician: Dr. Janan Ridge Lowery is a 75 y.o. old female seen in consultation for chief complaint of:    Chief Complaint  Patient presents with  . Leg Swelling    HPI:   The patient is a 75 year old female, she presented from home, after she called EMS she had sat down on the toilet and could not get up. She notes that her legs and belly have been coming increasingly swollen, and when this was noted by care providers. She was sent to the emergency room.she notes that over the last few weeks she's been having increasing shortness of breath, as well as cough, which is minimally productive. She was also having some nausea and vomiting. Upon presentation she had a chest x-ray which showed a large left-sided pleural effusion. Subsequently, she has undergone thoracentesis with drainage of 1.7 L from the left pleural space, she notes that her breathing is now improved.thus far, the LDH frer pleural flud is 56 with a tal protein of lesshan 3, which thus far appears consistent with  transudative pleural effusion. She has no other complaints at this time.  CXR images and reports from 11/11/15; 11/10/15, 09/11/15, 02/21/15.eview of the chest x-ray from July 2016 shows no significant pleural effusion with relatively normal lung parenchyma. The patient did appear to have moderate left pleural effusion in January of this year, subsequently on presentation on 3/29 she was noted to have a large left-sided pleural effusion. This appears to be nearly completely drained on 11/11/15.   PMHX:   Past Medical History  Diagnosis Date  . Vertigo   . Hypertension   . Diabetes ([place])   . A-fib [facility])    Surgical Hx:  Past Surgical History  Procedure Laterality Date  . Small intestine surgery    . Abdominal hysterectomy     Family Hx:  Family History  Problem Relation Age of Onset  . Cancer Mother   . Cancer Father   . Diabetes Sister    Social Hx:   Social History  Substance Use Topics  . Smoking status: Never Smoker   . Smokeless tobacco: Never Used  . Alcohol Use: No   Medication:   No current outpatient prescriptions on file.    Allergies:  Review of patient's allergies indicates no known allergies.  Review of Systems: Gen:  Denies  fever, sweats, chills HEENT: Denies blurred vision, double vision.  Cvc:  No dizziness, chest pain. Resp:   Denies cough or sputum production, shortness of breath Gi: Denies swallowing difficulty, stomach pain. Gu:  Denies bladder incontinence, burning urine Ext:   No Joint pain, stiffness. Skin: No skin rash,  hives  Endoc:  No  polyuria, polydipsia. Psych: No depression, insomnia. Other:  All other systems were reviewed with the patient and were negative other that what is mentioned in the HPI.   Physical Examination:   VS: BP 148/61 mmHg  Pulse 101  Temp(Src) 98 F (36.7 C) (Oral)  Resp 18  Ht 5\' 7"  (1.702 m)  Wt 222 lb 6.4 oz (100.88 kg)  BMI 34.82 kg/m2  SpO2 98%  LMP  (Approximate)  General Appearance: No distress    Neuro:without focal findings,  speech normal,  HEENT: PERRLA, EOM intact.   Pulmonary: normal breath sounds, No wheezing.  CardiovascularNormal S1,S2.  No m/r/g.   Abdomen: Benign, Soft, non-tender. Renal:  No costovertebral tenderness  GU:  No performed at this time. Endoc: No evident thyromegaly, no signs of acromegaly. Skin:   warm, no rashes, no ecchymosis  Extremities: normal, no cyanosis, clubbing.  Other findings:    LABORATORY PANEL:   CBC  Recent Labs Lab 11/10/15 2014  WBC 7.6  HGB 12.7  HCT 38.3  PLT 192   ------------------------------------------------------------------------------------------------------------------  Chemistries   Recent Labs Lab 11/10/15 2014  NA 137  K 4.9  CL 111  CO2 23  GLUCOSE 144*  BUN 40*  CREATININE 2.33*  CALCIUM 7.4*  AST 64*  ALT 38  ALKPHOS 72  BILITOT 0.6   ------------------------------------------------------------------------------------------------------------------  Cardiac Enzymes  Recent Labs Lab 11/11/15 1226  TROPONINI 0.08*   ------------------------------------------------------------  RADIOLOGY:  US Renal  11/11/2015  CLINICAL DATA:  Chronic renal disease. EXAM: RENAL / URINARY TRACT ULTRASOUND COMPLETE COMPARISON:  None. FINDINGS: Right Kidney: Length: 12.6 cm. Normal renal cortical thickness and echogenicity. No hydronephrosis. There is a bilobed cyst within the renal pelvis measuring 3.9 x 3.0 x 3.7 cm Left Kidney: Length: 12.4 cm. Normal renal cortical thickness and echogenicity. No hydronephrosis. There is a 2.1 x 1.8 x 2.4 cm cyst within the interpolar region of the left kidney. Bladder: Appears normal for degree of bladder distention. IMPRESSION: No hydronephrosis. Bilateral renal cysts. Electronically Signed   By: Lovey Newcomer M.D.   On: 11/11/2015 11:06   Dg Chest Port 1 View  11/10/2015  CLINICAL DATA:  Swelling of both legs. Congestive heart failure. Shortness of breath. EXAM: PORTABLE  CHEST 1 VIEW COMPARISON:  09/11/2015 FINDINGS: Pleural fluid on the left is increased, now filling 2/3 of the left hemi thorax. Associated compressive atelectasis of the left lower lobe. Small amount of pleural fluid is probably present on the right with mild right base atelectasis. Vascularity suggests venous hypertension. IMPRESSION: Enlargement of the left effusion, now occupying 2/3 of the left hemi thorax with left lower lobe collapse. Small amount of pleural fluid on the right with mild right base atelectasis. Electronically Signed   By: Nelson Chimes M.D.   On: 11/10/2015 20:44       Thank  you for the consultation and for allowing [facility] to assist in the care of your patient. Our recommendations are noted above.  Please contact us if we can be of further service.   Marda Stalker, MD.  Board Certified in Internal Medicine, Pulmonary Medicine, [place], and Sleep Medicine.  [facility] Office Number: [phone]  Patricia Pesa, M.D.  Vilinda Boehringer, M.D.  Merton Border, M.D  11/11/2015

## 2015-11-11 NOTE — Progress Notes (Signed)
Patient UA +, Dr paged twice no response as a yet.

## 2015-11-11 NOTE — Progress Notes (Signed)
OT Cancellation Note  Patient Details Name: Natachia Mastromarino MRN: [id number] DOB: 01/25/1941   Cancelled Treatment:    Reason Eval/Treat Not Completed: Medical issues which prohibited therapy;Other (comment) (Pt. wilth elevated troponin levls.) will continue to monitor and eval as appropriate.  Harrel Carina, MS, OTR/L  Harrel Carina 11/11/2015, 10:09 AM

## 2015-11-11 NOTE — Progress Notes (Signed)
Brief note Received consult from unit secretary and spoke with Dr. Darvin Neighbours for L pleural effusion.  Images reviewed and discussed with Dr. Darvin Neighbours  Recs 1. Check coags 2. U\S thora by IR for L pleural effusion 3. Order pleural fluid studies (LDH, protein, pH, glucose, culture) 4. Ct chest after fluid is drained.   Full Consult note to follow by Dr. Alva Garnet.  Vilinda Boehringer, MD [facility] Pager [phone] (please enter 7-digits) On Call Pager - [phone] (please enter 7-digits) Clinic - [phone]

## 2015-11-11 NOTE — Progress Notes (Signed)
Patient off the unit to radiology at this time

## 2015-11-11 NOTE — Plan of Care (Signed)
Problem: Pain Managment: Goal: General experience of comfort will improve Outcome: Progressing No pain noted on shift, stated only discomfort, will continue to monitor.

## 2015-11-11 NOTE — Progress Notes (Signed)
Patient returned to unit from radiology received report that a thoracentesis 1.7l fluid removed pper md order, pt tolerated, will continue to monitor

## 2015-11-11 NOTE — Procedures (Signed)
Interventional Radiology Procedure Note  Procedure:  Ultrasound guided left thoracentesis  Complications:  None  Estimated Blood Loss: < 10 mL  1.7 L of clear, yellowish pleural fluid withdrawn from left pleural space.  Post CXR pending.  Venetia Night. Kathlene Cote, M.D Pager:  [phone]

## 2015-11-11 NOTE — Progress Notes (Signed)
PT Cancellation Note  Patient Details Name: Lisa Lowery MRN: [id number] DOB: 03-10-1941   Cancelled Treatment:    Reason Eval/Treat Not Completed: Patient at procedure or test/unavailable (Consult received and chart reviewed.  Patient currently off unit for diagnostic testing.  Will re-attempt at later time/date as patient available and medically appropriate.)   Guinn Delarosa H. Owens Shark, PT, DPT, NCS 11/11/2015, 9:29 AM [phone]

## 2015-11-11 NOTE — Plan of Care (Signed)
Problem: Health Behavior/Discharge Planning: Goal: Ability to manage health-related needs will improve Outcome: Progressing Patient struggle with ADL's and can benefit from [facility]

## 2015-11-11 NOTE — Progress Notes (Signed)
PT Cancellation Note  Patient Details Name: Lisa Lowery MRN: [id number] DOB: April 06, 1941   Cancelled Treatment:    Reason Eval/Treat Not Completed: Patient declined, no reason specified (Consult received and chart reviewed.  Evaluation attempted, but patient refused participation with session this date due to fatigue after thoracentesis and difficulty managing bowels at this time.  Offered OOB to [facility], but continued to refuse.  Will re-attempt next date as patient medically appropriate and available/agreeable to session.)   Sharaine Delange H. Owens Shark, PT, DPT, NCS 11/11/2015, 4:08 PM [phone]

## 2015-11-11 NOTE — Consult Note (Signed)
[facility] Cardiology  CARDIOLOGY CONSULT NOTE  Patient ID: Lisa Lowery MRN: [id number] DOB/AGE: 1940/12/14 75 y.o.  Admit date: 11/10/2015 Referring Physician [place] Primary Physician  Primary Cardiologist Nehemiah Massed Reason for Consultation Atrial fibrillation  HPI: 75 year old female referred for atrial fibrillation and acute on chronic diastolic congestive heart failure. The patient was recently hospitalized 08/2015 for very similar symptoms. During that hospitalization, patient was noted to have borderline elevated troponin, felt to be due to demand supply ischemia. The patient was in atrial fibrillation, and started on Cardizem CD and Eliquis for stroke prevention. The patient has been noncompliant with her medications due to cost. Echocardiogram performed 09/11/2015 revealed normal left ventricular function with LV ejection fraction of 55-60%. The patient reports chronic chest discomfort with atypical features, nonexertional, which radiates across her chest. She has chronic exertional dyspnea, as well as shortness of breath at rest. The patient also complains of chronic peripheral edema. She reports that her edema has been progressive, and recently has limited her ability to ambulate. The patient lives primarily alone, and was on the toilet and was unable to stand because of peripheral edema. She called EMS, and the patient was brought to [facility] emergency room. Admission labs were notable for borderline elevated troponin of 0.08. EKG was nondiagnostic. Chest x-ray revealed left-sided pleural effusion. Admission labs were also notable for elevated BUN and creatinine of 40 and 2.33, respectively.  Review of systems complete and found to be negative unless listed above     Past Medical History  Diagnosis Date  . Vertigo   . Hypertension   . Diabetes ([place])   . A-fib [facility])     Past Surgical History  Procedure Laterality Date  . Small intestine surgery    . Abdominal hysterectomy      Prescriptions  prior to admission  Medication Sig Dispense Refill Last Dose  . Insulin Detemir (LEVEMIR FLEXTOUCH) 100 UNIT/ML Pen Inject 25 Units into the skin daily at 10 pm. (Patient taking differently: Inject 14 Units into the skin daily at 10 pm. ) 15 mL 11 11/09/2015 at Unknown time  . lisinopril (PRINIVIL,ZESTRIL) 2.5 MG tablet Take 1 tablet by mouth daily.   11/10/2015 at Unknown time   Social History   Social History  . Marital Status: Divorced    Spouse Name: N/A  . Number of Children: N/A  . Years of Education: N/A   Occupational History  . Not on file.   Social History Main Topics  . Smoking status: Never Smoker   . Smokeless tobacco: Never Used  . Alcohol Use: No  . Drug Use: No  . Sexual Activity: No   Other Topics Concern  . Not on file   Social History Narrative    Family History  Problem Relation Age of Onset  . Cancer Mother   . Cancer Father   . Diabetes Sister       Review of systems complete and found to be negative unless listed above      PHYSICAL EXAM  General: Well developed, well nourished, in no acute distress HEENT:  Normocephalic and atramatic Neck:  No JVD.  Lungs: Clear bilaterally to auscultation and percussion. Heart: HRRR . Normal S1 and S2 without gallops or murmurs.  Abdomen: Bowel sounds are positive, abdomen soft and non-tender  Msk:  Back normal, normal gait. Normal strength and tone for age. Extremities: No clubbing, cyanosis or edema.   Neuro: Alert and oriented X 3. Psych:  Good affect, responds appropriately  Labs:  Lab Results  Component Value Date   WBC 7.6 11/10/2015   HGB 12.7 11/10/2015   HCT 38.3 11/10/2015   MCV 89.2 11/10/2015   PLT 192 11/10/2015    Recent Labs Lab 11/10/15 2014  NA 137  K 4.9  CL 111  CO2 23  BUN 40*  CREATININE 2.33*  CALCIUM 7.4*  PROT 7.8  BILITOT 0.6  ALKPHOS 72  ALT 38  AST 64*  GLUCOSE 144*   Lab Results  Component Value Date   TROPONINI 0.08* 11/11/2015    Lab Results   Component Value Date   CHOL 203* 02/21/2015   Lab Results  Component Value Date   HDL 37* 02/21/2015   Lab Results  Component Value Date   LDLCALC 141* 02/21/2015   Lab Results  Component Value Date   TRIG 127 02/21/2015   Lab Results  Component Value Date   CHOLHDL 5.5 02/21/2015   No results found for: LDLDIRECT    Radiology: Dg Chest 1 View  11/11/2015  CLINICAL DATA:  Status post left thoracentesis. EXAM: CHEST 1 VIEW COMPARISON:  11/10/2015 FINDINGS: After thoracentesis, no significant discernible remaining pleural fluid is seen on the left. The lung is re-expanded. No evidence of pneumothorax. No pulmonary edema. No pleural fluid is seen on the right. IMPRESSION: Significant reduction in left pleural fluid volume after thoracentesis with re-expansion of the left lung. No pneumothorax is seen following the procedure. Electronically Signed   By: Aletta Edouard M.D.   On: 11/11/2015 15:47   US Renal  11/11/2015  CLINICAL DATA:  Chronic renal disease. EXAM: RENAL / URINARY TRACT ULTRASOUND COMPLETE COMPARISON:  None. FINDINGS: Right Kidney: Length: 12.6 cm. Normal renal cortical thickness and echogenicity. No hydronephrosis. There is a bilobed cyst within the renal pelvis measuring 3.9 x 3.0 x 3.7 cm Left Kidney: Length: 12.4 cm. Normal renal cortical thickness and echogenicity. No hydronephrosis. There is a 2.1 x 1.8 x 2.4 cm cyst within the interpolar region of the left kidney. Bladder: Appears normal for degree of bladder distention. IMPRESSION: No hydronephrosis. Bilateral renal cysts. Electronically Signed   By: Lovey Newcomer M.D.   On: 11/11/2015 11:06   Dg Chest Port 1 View  11/10/2015  CLINICAL DATA:  Swelling of both legs. Congestive heart failure. Shortness of breath. EXAM: PORTABLE CHEST 1 VIEW COMPARISON:  09/11/2015 FINDINGS: Pleural fluid on the left is increased, now filling 2/3 of the left hemi thorax. Associated compressive atelectasis of the left lower lobe. Small  amount of pleural fluid is probably present on the right with mild right base atelectasis. Vascularity suggests venous hypertension. IMPRESSION: Enlargement of the left effusion, now occupying 2/3 of the left hemi thorax with left lower lobe collapse. Small amount of pleural fluid on the right with mild right base atelectasis. Electronically Signed   By: Nelson Chimes M.D.   On: 11/10/2015 20:44   US Thoracentesis Asp Pleural Space W/img Guide  11/11/2015  CLINICAL DATA:  Large left pleural effusion and dyspnea requiring thoracentesis. EXAM: ULTRASOUND GUIDED LEFT THORACENTESIS COMPARISON:  Chest x-ray on 11/10/2015 PROCEDURE: An ultrasound guided thoracentesis was thoroughly discussed with the patient and questions answered. The benefits, risks, alternatives and complications were also discussed. The patient understands and wishes to proceed with the procedure. Written consent was obtained. A time-out was performed prior to the procedure. Ultrasound was performed to localize and mark an adequate pocket of fluid in the left posterior chest. The area was then prepped and draped in the normal  sterile fashion. 1% Lidocaine was used for local anesthesia. Under ultrasound guidance a 6 French Safe-T-Centesis catheter was introduced. Thoracentesis was performed. The catheter was removed and a dressing applied. COMPLICATIONS: None FINDINGS: A total of approximately 1.7 L of yellowish fluid was removed. A fluid sample was sent for laboratory analysis. Postprocedure ultrasound shows no significant residual pleural fluid remaining. IMPRESSION: Successful ultrasound guided left thoracentesis yielding 1.7 L of pleural fluid. Electronically Signed   By: Aletta Edouard M.D.   On: 11/11/2015 15:49    EKG: Atrial fibrillation  ASSESSMENT AND PLAN:   1. Atrial fibrillation with rapid ventricular rate, due to noncompliance with medications. Patient has chads Vasc of 5, and is a candidate for chronic anticoagulation. 2.  Peripheral edema, likely multifactorial, due to acute on chronic diastolic congestive heart failure, chronic kidney disease, venous insufficiency, medical and dietary noncompliance. Recent echocardiogram demonstrates normal left ventricular function. 3. Borderline elevated troponin, likely due to demand supply ischemia, not due to acute coronary syndrome 4. Left pleural effusion 5. Acute on chronic kidney disease  Recommendations  1. Agree with overall current therapy 2. Defer full dose anticoagulation 3. Agree with Cardizem CD and metoprolol tartrate for rate control 4. Consider warfarin for stroke prevention. I am concerned about patient's potential medical noncompliance. 5. Await pulmonary consult regarding left pleural effusion 6. Would defer invasive cardiac evaluation  Signed: Carden Teel MD,PhD, [facility] 11/11/2015, 4:58 PM

## 2015-11-11 NOTE — Plan of Care (Signed)
Problem: Education: Goal: Knowledge of [facility] Education information/materials will improve Outcome: Completed/Met Date Met:  11/11/15 Handouts given to patient about pleural effusion

## 2015-11-12 DIAGNOSIS — J9621 Acute and chronic respiratory failure with hypoxia: Secondary | ICD-10-CM

## 2015-11-12 DIAGNOSIS — J948 Other specified pleural conditions: Secondary | ICD-10-CM

## 2015-11-12 DIAGNOSIS — E8779 Other fluid overload: Secondary | ICD-10-CM

## 2015-11-12 LAB — COMPREHENSIVE METABOLIC PANEL
ALBUMIN: 1.2 g/dL — AB (ref 3.5–5.0)
ALK PHOS: 53 U/L (ref 38–126)
ALT: 29 U/L (ref 14–54)
AST: 38 U/L (ref 15–41)
Anion gap: 4 — ABNORMAL LOW (ref 5–15)
BUN: 47 mg/dL — ABNORMAL HIGH (ref 6–20)
CALCIUM: 7.1 mg/dL — AB (ref 8.9–10.3)
CO2: 23 mmol/L (ref 22–32)
CREATININE: 2.97 mg/dL — AB (ref 0.44–1.00)
Chloride: 111 mmol/L (ref 101–111)
GFR calc Af Amer: 17 mL/min — ABNORMAL LOW (ref >60)
GFR calc non Af Amer: 14 mL/min — ABNORMAL LOW (ref >60)
GLUCOSE: 118 mg/dL — AB (ref 65–99)
Potassium: 5 mmol/L (ref 3.5–5.1)
SODIUM: 138 mmol/L (ref 135–145)
Total Bilirubin: 0.6 mg/dL (ref 0.3–1.2)
Total Protein: 6.4 g/dL — ABNORMAL LOW (ref 6.5–8.1)

## 2015-11-12 LAB — MISC LABCORP TEST (SEND OUT): Labcorp test code: 19588

## 2015-11-12 LAB — GLUCOSE, CAPILLARY
GLUCOSE-CAPILLARY: 100 mg/dL — AB (ref 65–99)
GLUCOSE-CAPILLARY: 133 mg/dL — AB (ref 65–99)
Glucose-Capillary: 132 mg/dL — ABNORMAL HIGH (ref 65–99)
Glucose-Capillary: 131 mg/dL — ABNORMAL HIGH (ref 65–99)

## 2015-11-12 LAB — CYTOLOGY - NON PAP

## 2015-11-12 MED ORDER — ACETAMINOPHEN 325 MG PO TABS
650.0000 mg | ORAL_TABLET | Freq: Four times a day (QID) | ORAL | Status: DC | PRN
  Administered 2015-11-12: 650 mg via ORAL
  Filled 2015-11-12: qty 2

## 2015-11-12 MED ORDER — FUROSEMIDE 10 MG/ML IJ SOLN
10.0000 mg/h | INTRAVENOUS | Status: DC
  Administered 2015-11-12: 8 mg/h via INTRAVENOUS
  Administered 2015-11-13: 10 mg/h via INTRAVENOUS
  Filled 2015-11-12 (×2): qty 25

## 2015-11-12 MED ORDER — ALBUMIN HUMAN 25 % IV SOLN
12.5000 g | Freq: Two times a day (BID) | INTRAVENOUS | Status: DC
  Administered 2015-11-12 (×2): 12.5 g via INTRAVENOUS
  Filled 2015-11-12 (×4): qty 50

## 2015-11-12 NOTE — Progress Notes (Signed)
Pt's BP 86/43 this morning, pt asymptomatic, resting comfortably. MD Dr. Estanislado Pandy notified. Ordered to hold morning dose of lasix. RN will continue to monitor. Rachael Fee, RN

## 2015-11-12 NOTE — Progress Notes (Signed)
PT Cancellation Note  Patient Details Name: Lisa Lowery MRN: [id number] DOB: [birth year]   Cancelled Treatment:    Reason Eval/Treat Not Completed: Patient declined, no reason specified (Evaluation re-attempted.  Patient politely declining again, states "I know I can get up to the chair, but not right now".  Unable to redirect or encourage.  Will re-attempt again at later time/date as medically appropriate and patient agreeable.)   Davi Rotan H. Owens Shark, PT, DPT, NCS 11/12/2015, 12:02 PM [phone]

## 2015-11-12 NOTE — Evaluation (Signed)
Occupational Therapy Evaluation Patient Details Name: Lisa Lowery MRN: [id number] DOB: 1940/12/28 Today's Date: 11/12/2015    History of Present Illness   Pt. was admitted for shortness of breath and lower extremity swelling and found to be in atrial fibrillation with rapid ventricular rate. Left thoracentesis done and 1.7 L fluid removed on 03/30/2017admitted for shortness of breath and lower extremity swelling and found to be in atrial fibrillation with rapid ventricular rate.   Clinical Impression    Pt. is a 75 y.o. Female who was admitted to [facility] for SOB and LE edema. Pt. Presents with limited activity tolerance and weakness which hinders her ability to complete ADL and IADL tasks. Pt. Could benefit from continued skilled OT services to improve overall strength, and endurance for participation and engagement in ADL/IADL tasks. Pt. Could also benefit from education in energy conservation/work simplication.    Follow Up Recommendations  SNF    Equipment Recommendations       Recommendations for Other Services       Precautions / Restrictions Precautions Precautions: None Restrictions Weight Bearing Restrictions: No      Mobility Bed Mobility               General bed mobility comments: Deferred as nursing is preparing to place a catheter, and IV Lasiks  Transfers                 General transfer comment: Declined OOB mobility    Balance                                            ADL Overall ADL's : Needs assistance/impaired Eating/Feeding: Set up   Grooming: Set up                                 General ADL Comments: Pt. requires extensive assist for LE ADLs     Vision     Perception     Praxis      Pertinent Vitals/Pain Pain Assessment: No/denies pain     Hand Dominance     Extremity/Trunk Assessment Upper Extremity Assessment Upper Extremity Assessment: Overall WFL for tasks assessed            Communication Communication Communication: No difficulties   Cognition                           General Comments       Exercises       Shoulder Instructions      Home Living Family/patient expects to be discharged to:: Private residence Living Arrangements: Children Available Help at Discharge: Family Type of Home: Apartment Home Access: Stairs to enter Technical brewer of Steps: 4   Home Layout: One level     Bathroom Shower/Tub: Walk-in shower             Additional Comments: Pt. now resides with her son in his apartment. Pt. reports that her son in never, or rarely there.      Prior Functioning/Environment Level of Independence: Independent             OT Diagnosis: Generalized weakness   OT Problem List: Decreased strength;Decreased activity tolerance;Decreased knowledge of use of DME or AE   OT Treatment/Interventions:      OT  Goals(Current goals can be found in the care plan section) Acute Rehab OT Goals Patient Stated Goal: To return regain independence OT Goal Formulation: With patient Time For Goal Achievement: 11/26/15 Potential to Achieve Goals: Fair  OT Frequency:     Barriers to D/C:            Co-evaluation              End of Session    Activity Tolerance: Patient limited by lethargy Patient left:  In bed, with bed alarm activated, and call bell within reach.   Time:  -    Charges:  OT General Charges $OT Visit: 1 Procedure G-Codes:    Harrel Carina, MS, OTR/L  Harrel Carina 11/12/2015, 2:36 PM

## 2015-11-12 NOTE — Progress Notes (Signed)
[facility] Cardiology  SUBJECTIVE: I feel bad   Filed Vitals:   11/12/15 0450 11/12/15 0459 11/12/15 0818 11/12/15 0818  BP: 79/42 86/43 96/46  96/46  Pulse: 69 82 81 81  Temp:   97.6 F (36.4 C)   TempSrc:   Oral   Resp:      Height:      Weight:      SpO2:  90% 93% 93%     Intake/Output Summary (Last 24 hours) at 11/12/15 [id number] Last data filed at 11/12/15 0432  Gross per 24 hour  Intake    240 ml  Output    100 ml  Net    140 ml      PHYSICAL EXAM  General: Well developed, well nourished, in no acute distress HEENT:  Normocephalic and atramatic Neck:  No JVD.  Lungs: Clear bilaterally to auscultation and percussion. Heart: HRRR . Normal S1 and S2 without gallops or murmurs.  Abdomen: Bowel sounds are positive, abdomen soft and non-tender  Msk:  Back normal, normal gait. Normal strength and tone for age. Extremities: 1-2+ bilateral pedal edema  Neuro: Alert and oriented X 3. Psych:  Good affect, responds appropriately   LABS: Basic Metabolic Panel:  Recent Labs  11/10/15 2014 11/12/15 0407  NA 137 138  K 4.9 5.0  CL 111 111  CO2 23 23  GLUCOSE 144* 118*  BUN 40* 47*  CREATININE 2.33* 2.97*  CALCIUM 7.4* 7.1*   Liver Function Tests:  Recent Labs  11/10/15 2014 11/12/15 0407  AST 64* 38  ALT 38 29  ALKPHOS 72 53  BILITOT 0.6 0.6  PROT 7.8 6.4*  ALBUMIN 1.4* 1.2*   No results for input(s): LIPASE, AMYLASE in the last 72 hours. CBC:  Recent Labs  11/10/15 2014  WBC 7.6  NEUTROABS 5.3  HGB 12.7  HCT 38.3  MCV 89.2  PLT 192   Cardiac Enzymes:  Recent Labs  11/11/15 0017 11/11/15 0406 11/11/15 1226  TROPONINI 0.08* 0.11* 0.08*   BNP: Invalid input(s): POCBNP D-Dimer: No results for input(s): DDIMER in the last 72 hours. Hemoglobin A1C: No results for input(s): HGBA1C in the last 72 hours. Fasting Lipid Panel: No results for input(s): CHOL, HDL, LDLCALC, TRIG, CHOLHDL, LDLDIRECT in the last 72 hours. Thyroid Function Tests: No  results for input(s): TSH, T4TOTAL, T3FREE, THYROIDAB in the last 72 hours.  Invalid input(s): FREET3 Anemia Panel: No results for input(s): VITAMINB12, FOLATE, FERRITIN, TIBC, IRON, RETICCTPCT in the last 72 hours.  Dg Chest 1 View  11/11/2015  CLINICAL DATA:  Status post left thoracentesis. EXAM: CHEST 1 VIEW COMPARISON:  11/10/2015 FINDINGS: After thoracentesis, no significant discernible remaining pleural fluid is seen on the left. The lung is re-expanded. No evidence of pneumothorax. No pulmonary edema. No pleural fluid is seen on the right. IMPRESSION: Significant reduction in left pleural fluid volume after thoracentesis with re-expansion of the left lung. No pneumothorax is seen following the procedure. Electronically Signed   By: Aletta Edouard M.D.   On: 11/11/2015 15:47   US Renal  11/11/2015  CLINICAL DATA:  Chronic renal disease. EXAM: RENAL / URINARY TRACT ULTRASOUND COMPLETE COMPARISON:  None. FINDINGS: Right Kidney: Length: 12.6 cm. Normal renal cortical thickness and echogenicity. No hydronephrosis. There is a bilobed cyst within the renal pelvis measuring 3.9 x 3.0 x 3.7 cm Left Kidney: Length: 12.4 cm. Normal renal cortical thickness and echogenicity. No hydronephrosis. There is a 2.1 x 1.8 x 2.4 cm cyst within the interpolar region of  the left kidney. Bladder: Appears normal for degree of bladder distention. IMPRESSION: No hydronephrosis. Bilateral renal cysts. Electronically Signed   By: Lovey Newcomer M.D.   On: 11/11/2015 11:06   Dg Chest Port 1 View  11/10/2015  CLINICAL DATA:  Swelling of both legs. Congestive heart failure. Shortness of breath. EXAM: PORTABLE CHEST 1 VIEW COMPARISON:  09/11/2015 FINDINGS: Pleural fluid on the left is increased, now filling 2/3 of the left hemi thorax. Associated compressive atelectasis of the left lower lobe. Small amount of pleural fluid is probably present on the right with mild right base atelectasis. Vascularity suggests venous hypertension.  IMPRESSION: Enlargement of the left effusion, now occupying 2/3 of the left hemi thorax with left lower lobe collapse. Small amount of pleural fluid on the right with mild right base atelectasis. Electronically Signed   By: Nelson Chimes M.D.   On: 11/10/2015 20:44   US Thoracentesis Asp Pleural Space W/img Guide  11/11/2015  CLINICAL DATA:  Large left pleural effusion and dyspnea requiring thoracentesis. EXAM: ULTRASOUND GUIDED LEFT THORACENTESIS COMPARISON:  Chest x-ray on 11/10/2015 PROCEDURE: An ultrasound guided thoracentesis was thoroughly discussed with the patient and questions answered. The benefits, risks, alternatives and complications were also discussed. The patient understands and wishes to proceed with the procedure. Written consent was obtained. A time-out was performed prior to the procedure. Ultrasound was performed to localize and mark an adequate pocket of fluid in the left posterior chest. The area was then prepped and draped in the normal sterile fashion. 1% Lidocaine was used for local anesthesia. Under ultrasound guidance a 6 French Safe-T-Centesis catheter was introduced. Thoracentesis was performed. The catheter was removed and a dressing applied. COMPLICATIONS: None FINDINGS: A total of approximately 1.7 L of yellowish fluid was removed. A fluid sample was sent for laboratory analysis. Postprocedure ultrasound shows no significant residual pleural fluid remaining. IMPRESSION: Successful ultrasound guided left thoracentesis yielding 1.7 L of pleural fluid. Electronically Signed   By: Aletta Edouard M.D.   On: 11/11/2015 15:49     Echo normal left ventricular function with LV ejection fraction of 55-60%  TELEMETRY: Atrial fibrillation:  ASSESSMENT AND PLAN:  Principal Problem:   Pleural effusion, left Active Problems:   Elevated troponin   HTN (hypertension)   Atrial fibrillation with RVR (HCC)   General weakness   DM2 (diabetes mellitus, type 2) ([place])    1. Atrial  fibrillation, rate well controlled on current medications. Chads Vasc 5, previously on Eliquis with noncompliance 2. Peripheral edema, multifactorial, secondary to acute on chronic congestive heart failure, chronic kidney disease, venous insufficiency, medical and dietary noncompliance. 2-D echocardiogram reveals normal left ventricular function. 3. And borderline elevated troponin, likely due to demand supply ischemia 4. Chronic left pleural effusion 5. Acute on chronic kidney disease  Recommendations  1. Agree with overall current therapy 2. Defer full dose anticoagulation 3. Continue Cardizem CD and metoprolol titrate for rate control 4. Hesitant to start chronic anticoagulation in light of patient's history of dietary and medical noncompliance. Patient could be started on warfarin as outpatient when she demonstrates ability to be compliant and to follow-up with outpatient care.   Isaias Cowman, MD, PhD, [facility] 11/12/2015 9:21 AM

## 2015-11-12 NOTE — Care Management Important Message (Signed)
Important Message  Patient Details  Name: Lisa Lowery MRN: [id number] Date of Birth: 1940-09-10   Medicare Important Message Given:  Yes    Katrina Stack, RN 11/12/2015, 4:59 PM

## 2015-11-12 NOTE — Progress Notes (Signed)
Initial Nutrition Assessment    INTERVENTION:   Meals and Snacks: Cater to patient preferences Education: pt verbalized understanding of current diet order, no education provided at this time   NUTRITION DIAGNOSIS:   No nutrition diagnosis at this time  GOAL:   Patient will meet greater than or equal to 90% of their needs  MONITOR:   PO intake, Labs, Weight trends  REASON FOR ASSESSMENT:   Consult Assessment of nutrition requirement/status  ASSESSMENT:    Pt admitted with ARF, bilateral pleural effusions due to CHF, s/p left thoracentesis with 1.7 L removed  Past Medical History  Diagnosis Date  . Vertigo   . Hypertension   . Diabetes ([place])   . A-fib [facility])      Diet Order:  Diet heart healthy/carb modified Room service appropriate?: Yes; Fluid consistency:: Thin   Energy Intake: recorded po intake 90% of meals on average, appetite good, pt eating breakfast on visit today, a little shaky. Pt reports good appetite prior to admission  Skin:  Reviewed, no issues  Last BM:  11/11/15  Height:   Ht Readings from Last 1 Encounters:  11/10/15 5\' 7"  (1.702 m)    Weight: no recent wt loss but wt gain  Wt Readings from Last 1 Encounters:  11/12/15 220 lb 6.8 oz (99.984 kg)   Wt Readings from Last 10 Encounters:  11/12/15 220 lb 6.8 oz (99.984 kg)  09/13/15 195 lb 4.8 oz (88.587 kg)  02/23/15 191 lb 4.8 oz (86.773 kg)    BMI:  Body mass index is 34.52 kg/(m^2).  Estimated Nutritional Needs:   Kcal:  1500-1800 kcals   Protein:  75-100 g of protein  Fluid:  >1.5L  EDUCATION NEEDS:   No education needs identified at this time  [place], [place], [place] [phone] Pager  [phone] Weekend/On-Call Pager

## 2015-11-12 NOTE — Care Management (Signed)
has not participated with physical therapy yet. Thoracentesis performed and is now on lasix continuous infusion.

## 2015-11-12 NOTE — Progress Notes (Signed)
[facility] [place] NAME: Lisa Lowery    MR#:  [id number]  DATE OF BIRTH:  01-13-1941  SUBJECTIVE:  CHIEF COMPLAINT:   Chief Complaint  Patient presents with  . Leg Swelling   admitted for shortness of breath and lower extremity swelling and found to be in atrial fibrillation with rapid ventricular rate. Left thoracentesis done and 1.7 L fluid removed on 11/11/2015.  Continues to feel weak. Lives at home with his son and amylase with a walker at baseline  REVIEW OF SYSTEMS:    Review of Systems  Constitutional: Positive for malaise/fatigue. Negative for fever and chills.  HENT: Negative for sore throat.   Eyes: Negative for blurred vision, double vision and pain.  Respiratory: Positive for cough and shortness of breath. Negative for hemoptysis and wheezing.   Cardiovascular: Positive for orthopnea and leg swelling. Negative for chest pain and palpitations.  Gastrointestinal: Negative for heartburn, nausea, vomiting, abdominal pain, diarrhea and constipation.  Genitourinary: Negative for dysuria and hematuria.  Musculoskeletal: Negative for back pain and joint pain.  Skin: Negative for rash.  Neurological: Positive for dizziness and weakness. Negative for sensory change, speech change, focal weakness and headaches.  Endo/Heme/Allergies: Does not bruise/bleed easily.  Psychiatric/Behavioral: Negative for depression. The patient is not nervous/anxious.     DRUG ALLERGIES:  No Known Allergies  VITALS:  Blood pressure 96/46, pulse 81, temperature 97.6 F (36.4 C), temperature source Oral, resp. rate 16, height 5\' 7"  (1.702 m), weight 99.984 kg (220 lb 6.8 oz), SpO2 93 %.  PHYSICAL EXAMINATION:   Physical Exam  GENERAL:  75 y.o.-year-old patient lying in the bed with no acute distress.  EYES: Pupils equal, round, reactive to light and accommodation. No scleral icterus. Extraocular muscles intact.  HEENT: Head atraumatic,  normocephalic. Oropharynx and nasopharynx clear.  NECK:  Supple, no jugular venous distention. No thyroid enlargement, no tenderness.  LUNGS: Bilateral crackles. Decreased air entry at the bases. CARDIOVASCULAR: S1, S2 normal. No murmurs, rubs, or gallops. Irregular ABDOMEN: Soft, nontender, nondistended. Bowel sounds present. No organomegaly or mass.  EXTREMITIES: No cyanosis, clubbing. 2+ lower extremity edema NEUROLOGIC: Cranial nerves II through XII are intact. No focal Motor or sensory deficits b/l.   PSYCHIATRIC: The patient is alert and oriented x 3.  SKIN: No obvious rash, lesion, or ulcer.   LABORATORY PANEL:   CBC  Recent Labs Lab 11/10/15 2014  WBC 7.6  HGB 12.7  HCT 38.3  PLT 192   ------------------------------------------------------------------------------------------------------------------ Chemistries   Recent Labs Lab 11/12/15 0407  NA 138  K 5.0  CL 111  CO2 23  GLUCOSE 118*  BUN 47*  CREATININE 2.97*  CALCIUM 7.1*  AST 38  ALT 29  ALKPHOS 53  BILITOT 0.6   ------------------------------------------------------------------------------------------------------------------  Cardiac Enzymes  Recent Labs Lab 11/11/15 1226  TROPONINI 0.08*   ------------------------------------------------------------------------------------------------------------------  RADIOLOGY:  Dg Chest 1 View  11/11/2015  CLINICAL DATA:  Status post left thoracentesis. EXAM: CHEST 1 VIEW COMPARISON:  11/10/2015 FINDINGS: After thoracentesis, no significant discernible remaining pleural fluid is seen on the left. The lung is re-expanded. No evidence of pneumothorax. No pulmonary edema. No pleural fluid is seen on the right. IMPRESSION: Significant reduction in left pleural fluid volume after thoracentesis with re-expansion of the left lung. No pneumothorax is seen following the procedure. Electronically Signed   By: Aletta Edouard M.D.   On: 11/11/2015 15:47   US  Renal  11/11/2015  CLINICAL DATA:  Chronic  renal disease. EXAM: RENAL / URINARY TRACT ULTRASOUND COMPLETE COMPARISON:  None. FINDINGS: Right Kidney: Length: 12.6 cm. Normal renal cortical thickness and echogenicity. No hydronephrosis. There is a bilobed cyst within the renal pelvis measuring 3.9 x 3.0 x 3.7 cm Left Kidney: Length: 12.4 cm. Normal renal cortical thickness and echogenicity. No hydronephrosis. There is a 2.1 x 1.8 x 2.4 cm cyst within the interpolar region of the left kidney. Bladder: Appears normal for degree of bladder distention. IMPRESSION: No hydronephrosis. Bilateral renal cysts. Electronically Signed   By: Lovey Newcomer M.D.   On: 11/11/2015 11:06   Dg Chest Port 1 View  11/10/2015  CLINICAL DATA:  Swelling of both legs. Congestive heart failure. Shortness of breath. EXAM: PORTABLE CHEST 1 VIEW COMPARISON:  09/11/2015 FINDINGS: Pleural fluid on the left is increased, now filling 2/3 of the left hemi thorax. Associated compressive atelectasis of the left lower lobe. Small amount of pleural fluid is probably present on the right with mild right base atelectasis. Vascularity suggests venous hypertension. IMPRESSION: Enlargement of the left effusion, now occupying 2/3 of the left hemi thorax with left lower lobe collapse. Small amount of pleural fluid on the right with mild right base atelectasis. Electronically Signed   By: Nelson Chimes M.D.   On: 11/10/2015 20:44   US Thoracentesis Asp Pleural Space W/img Guide  11/11/2015  CLINICAL DATA:  Large left pleural effusion and dyspnea requiring thoracentesis. EXAM: ULTRASOUND GUIDED LEFT THORACENTESIS COMPARISON:  Chest x-ray on 11/10/2015 PROCEDURE: An ultrasound guided thoracentesis was thoroughly discussed with the patient and questions answered. The benefits, risks, alternatives and complications were also discussed. The patient understands and wishes to proceed with the procedure. Written consent was obtained. A time-out was performed prior to  the procedure. Ultrasound was performed to localize and mark an adequate pocket of fluid in the left posterior chest. The area was then prepped and draped in the normal sterile fashion. 1% Lidocaine was used for local anesthesia. Under ultrasound guidance a 6 French Safe-T-Centesis catheter was introduced. Thoracentesis was performed. The catheter was removed and a dressing applied. COMPLICATIONS: None FINDINGS: A total of approximately 1.7 L of yellowish fluid was removed. A fluid sample was sent for laboratory analysis. Postprocedure ultrasound shows no significant residual pleural fluid remaining. IMPRESSION: Successful ultrasound guided left thoracentesis yielding 1.7 L of pleural fluid. Electronically Signed   By: Aletta Edouard M.D.   On: 11/11/2015 15:49     ASSESSMENT AND PLAN:  75 year old female patient with history of diastolic CHF, chronic troponin elevation, atrial fibrillation and noncompliance presents to the hospital with shortness of breath and lower extremity swelling.  * Acute renal failure due to likely cardiorenal syndrome Worsening creatinine due to IV Lasix. Started albumin twice a day for 4 doses. Added Lasix drip.  * Bilateral pleural effusions left greater than right likely due to congestive heart failure On IV Lasix Status post left thoracentesis with 1.7 L.  transudate. Await for final cultures.  * Acute on chronic diastolic congestive heart failure with acute respiratory failure - IV Lasix drip , Beta blockers - Input and Output - Counseled to limit fluids and Salt - Monitor Bun/Cr and Potassium -Cardiology follow up  * A. Fib with RVR Rate controlled with oral Cardizem and metoprolol.  * UTI On IV ceftriaxone  * Hypertension Hypotensive today. Cardizem held.  * Diabetes mellitus Sliding scale insulin  * Chronic mild elevation of troponin is stable  All the records are reviewed and case discussed with  Care Management/Social Workerr. Management plans  discussed with the patient, family and they are in agreement.  CODE STATUS: FULL CODE  DVT Prophylaxis: SCDs  TOTAL TIME TAKING CARE OF THIS PATIENT: 35 minutes.   POSSIBLE D/C IN 2-3 DAYS, DEPENDING ON CLINICAL CONDITION.  Hillary Bow R M.D on 11/12/2015 at 10:31 AM  Between 7am to 6pm - Pager - [phone]  After 6pm go to [email or web address] - password [email or web address]  [place] [facility]  Office  [phone]  CC: Primary care physician; No PCP Per Patient  Note: This dictation was prepared with Dragon dictation along with smaller phrase technology. Any transcriptional errors that result from this process are unintentional.

## 2015-11-12 NOTE — Progress Notes (Signed)
[facility]  ROUNDING NOTE   Subjective:   Hypotensive, held furosemide bolus Patient states she is breathing better.   Creatinine 2.97 (2.33)  Objective:  Vital signs in last 24 hours:  Temp:  [97.6 F (36.4 C)-98.1 F (36.7 C)] 97.6 F (36.4 C) (03/31 0818) Pulse Rate:  [67-98] 81 (03/31 0818) Resp:  [16-20] 16 (03/31 0423) BP: (75-162)/(40-64) 96/46 mmHg (03/31 0818) SpO2:  [90 %-98 %] 93 % (03/31 0818) Weight:  [99.984 kg (220 lb 6.8 oz)] 99.984 kg (220 lb 6.8 oz) (03/31 0423)  Weight change: 11.533 kg (25 lb 6.8 oz) Filed Weights   11/11/15 0036 11/11/15 0429 11/12/15 0423  Weight: 101.197 kg (223 lb 1.6 oz) 100.88 kg (222 lb 6.4 oz) 99.984 kg (220 lb 6.8 oz)    Intake/Output: I/O last 3 completed shifts: In: 480 [P.O.:480] Out: 300 [Urine:300]   Intake/Output this shift:     Physical Exam: General: NAD, sitting up in bed  Head: Normocephalic, atraumatic. Moist oral mucosal membranes  Eyes: Anicteric, PERRL  Neck: Supple, trachea midline  Lungs:  Crackles bilaterally  Heart: Regular rate and rhythm  Abdomen:  Soft, nontender,   Extremities: + peripheral edema.  Neurologic: Nonfocal, moving all four extremities  Skin: No lesions       Basic Metabolic Panel:  Recent Labs Lab 11/10/15 2014 11/12/15 0407  NA 137 138  K 4.9 5.0  CL 111 111  CO2 23 23  GLUCOSE 144* 118*  BUN 40* 47*  CREATININE 2.33* 2.97*  CALCIUM 7.4* 7.1*    Liver Function Tests:  Recent Labs Lab 11/10/15 2014 11/12/15 0407  AST 64* 38  ALT 38 29  ALKPHOS 72 53  BILITOT 0.6 0.6  PROT 7.8 6.4*  ALBUMIN 1.4* 1.2*   No results for input(s): LIPASE, AMYLASE in the last 168 hours. No results for input(s): AMMONIA in the last 168 hours.  CBC:  Recent Labs Lab 11/10/15 2014  WBC 7.6  NEUTROABS 5.3  HGB 12.7  HCT 38.3  MCV 89.2  PLT 192    Cardiac Enzymes:  Recent Labs Lab 11/10/15 2014 11/11/15 0017 11/11/15 0406 11/11/15 1226  TROPONINI  0.08* 0.08* 0.11* 0.08*    BNP: Invalid input(s): POCBNP  CBG:  Recent Labs Lab 11/11/15 0754 11/11/15 1255 11/11/15 1642 11/11/15 2110 11/12/15 0726  GLUCAP 110* 105* 99 110* 100*    Microbiology: Results for orders placed or performed during the hospital encounter of 11/10/15  Urine culture     Status: None (Preliminary result)   Collection Time: 11/11/15  3:44 AM  Result Value Ref Range Status   Specimen Description URINE, RANDOM  Final   Special Requests NONE  Final   Culture NO GROWTH < 24 HOURS  Final   Report Status PENDING  Incomplete    Coagulation Studies: No results for input(s): LABPROT, INR in the last 72 hours.  Urinalysis:  Recent Labs  11/11/15 0344  COLORURINE YELLOW*  LABSPEC 1.015  PHURINE 5.0  GLUCOSEU NEGATIVE  HGBUR 2+*  BILIRUBINUR NEGATIVE  KETONESUR NEGATIVE  PROTEINUR >500*  NITRITE NEGATIVE  LEUKOCYTESUR TRACE*      Imaging: Dg Chest 1 View  11/11/2015  CLINICAL DATA:  Status post left thoracentesis. EXAM: CHEST 1 VIEW COMPARISON:  11/10/2015 FINDINGS: After thoracentesis, no significant discernible remaining pleural fluid is seen on the left. The lung is re-expanded. No evidence of pneumothorax. No pulmonary edema. No pleural fluid is seen on the right. IMPRESSION: Significant reduction in left pleural fluid  volume after thoracentesis with re-expansion of the left lung. No pneumothorax is seen following the procedure. Electronically Signed   By: Aletta Edouard M.D.   On: 11/11/2015 15:47   US Renal  11/11/2015  CLINICAL DATA:  Chronic renal disease. EXAM: RENAL / URINARY TRACT ULTRASOUND COMPLETE COMPARISON:  None. FINDINGS: Right Kidney: Length: 12.6 cm. Normal renal cortical thickness and echogenicity. No hydronephrosis. There is a bilobed cyst within the renal pelvis measuring 3.9 x 3.0 x 3.7 cm Left Kidney: Length: 12.4 cm. Normal renal cortical thickness and echogenicity. No hydronephrosis. There is a 2.1 x 1.8 x 2.4 cm cyst  within the interpolar region of the left kidney. Bladder: Appears normal for degree of bladder distention. IMPRESSION: No hydronephrosis. Bilateral renal cysts. Electronically Signed   By: Lovey Newcomer M.D.   On: 11/11/2015 11:06   Dg Chest Port 1 View  11/10/2015  CLINICAL DATA:  Swelling of both legs. Congestive heart failure. Shortness of breath. EXAM: PORTABLE CHEST 1 VIEW COMPARISON:  09/11/2015 FINDINGS: Pleural fluid on the left is increased, now filling 2/3 of the left hemi thorax. Associated compressive atelectasis of the left lower lobe. Small amount of pleural fluid is probably present on the right with mild right base atelectasis. Vascularity suggests venous hypertension. IMPRESSION: Enlargement of the left effusion, now occupying 2/3 of the left hemi thorax with left lower lobe collapse. Small amount of pleural fluid on the right with mild right base atelectasis. Electronically Signed   By: Nelson Chimes M.D.   On: 11/10/2015 20:44   US Thoracentesis Asp Pleural Space W/img Guide  11/11/2015  CLINICAL DATA:  Large left pleural effusion and dyspnea requiring thoracentesis. EXAM: ULTRASOUND GUIDED LEFT THORACENTESIS COMPARISON:  Chest x-ray on 11/10/2015 PROCEDURE: An ultrasound guided thoracentesis was thoroughly discussed with the patient and questions answered. The benefits, risks, alternatives and complications were also discussed. The patient understands and wishes to proceed with the procedure. Written consent was obtained. A time-out was performed prior to the procedure. Ultrasound was performed to localize and mark an adequate pocket of fluid in the left posterior chest. The area was then prepped and draped in the normal sterile fashion. 1% Lidocaine was used for local anesthesia. Under ultrasound guidance a 6 French Safe-T-Centesis catheter was introduced. Thoracentesis was performed. The catheter was removed and a dressing applied. COMPLICATIONS: None FINDINGS: A total of approximately 1.7 L  of yellowish fluid was removed. A fluid sample was sent for laboratory analysis. Postprocedure ultrasound shows no significant residual pleural fluid remaining. IMPRESSION: Successful ultrasound guided left thoracentesis yielding 1.7 L of pleural fluid. Electronically Signed   By: Aletta Edouard M.D.   On: 11/11/2015 15:49     Medications:   . furosemide (LASIX) infusion 8 mg/hr (11/12/15 1038)   . albumin human  12.5 g Intravenous BID  . aspirin EC  81 mg Oral Daily  . cefTRIAXone (ROCEPHIN)  IV  1 g Intravenous Q24H  . insulin aspart  0-15 Units Subcutaneous TID WC  . metoprolol tartrate  25 mg Oral BID  . sodium chloride flush  3 mL Intravenous Q12H   acetaminophen  Assessment/ Plan:  Ms. Kewana Sanon is a 75 y.o. white female with atrial fibrillation, diabetes mellitus type II, hypertension, vertigo, coronary artery disease, who was admitted to [facility] on 11/10/2015 with left pleural effusion. Status post left thoracentesis on 11/11/15  1. Acute renal failure on chronic kidney disease stage III with proteinuria: baseline creatinine 1.25 eGFR 41 from 09/11/15. Acute renal failure  from acute cardiorenal syndrome. Recent cardiac procedure. Not on diuretics or low salt diet at home. - hold lisinopril - furosemide gtt due to hypotension  2. Hypertension: with atrial fibrillation. Significant peripheral edema. Echo from 1/17 without CHF. Currently hypotensive - metoprolol for rate control.  - furosemide gtt as above. Iv albumin  3. Diabetes mellitus type II Insulin dependent with chronic kidney disease: hemoglobin A1c from 09/11/15 5.1%  4. Urinary Tract Infection: cultures pending - ceftriaxone   LOS: [place], Jasenia Weilbacher 3/31/201711:08 AM

## 2015-11-12 NOTE — Consult Note (Signed)
   [facility] Inpatient Consult   11/12/2015  Lisa Lowery [birth year] [id number]  Patient was evaluated for eligibility of [facility] care management services per request of inpatient care manager. Chart review and ACO registry reveal patient is ineligible for [facility] Care Management services at this time. Reason: Patient is not a beneficiary currently attributed to one of the [place] Registry populations. Membership roster used to verify non- eligible status. For questions please contact:  Anadalay Macdonell RN, [facility] Liaison  [phone]) Business Mobile [phone]) Toll free office

## 2015-11-12 NOTE — Progress Notes (Signed)
Pt alert and oriented x4, no complaints of pain or discomfort.  Bed in low position, call bell within reach.  Bed alarms on and functioning.  Assessment done and charted.  Will continue to monitor and do hourly rounding throughout the shift 

## 2015-11-12 NOTE — Progress Notes (Signed)
No new complaints No distress Reports less dyspnea and chest fullness after thoracentesis  Filed Vitals:   11/12/15 0459 11/12/15 0818 11/12/15 0818 11/12/15 1129  BP: 86/43 96/46 96/46  92/45  Pulse: 82 81 81 76  Temp:  97.6 F (36.4 C)  97.5 F (36.4 C)  TempSrc:  Oral  Oral  Resp:    22  Height:      Weight:      SpO2: 90% 93% 93% 91%   NAD Very weak JVP not well visualized Bronchial BS approx 1/4 up on L No wheezes IRIR, rate controlled, no M Obese, soft, NT, NABS 2-3+ symmetric pretibial and ankle edema 1+ carpal edema  BMP Latest Ref Rng 11/12/2015 11/10/2015 09/11/2015  Glucose 65 - 99 mg/dL 118(H) 144(H) 143(H)  BUN 6 - 20 mg/dL 47(H) 40(H) 19  Creatinine 0.44 - 1.00 mg/dL 2.97(H) 2.33(H) 1.25(H)  Sodium 135 - 145 mmol/L 138 137 141  Potassium 3.5 - 5.1 mmol/L 5.0 4.9 3.8  Chloride 101 - 111 mmol/L 111 111 112(H)  CO2 22 - 32 mmol/L 23 23 24   Calcium 8.9 - 10.3 mg/dL 7.1(L) 7.4(L) 8.0(L)    CBC Latest Ref Rng 11/10/2015 09/11/2015 02/21/2015  WBC 3.6 - 11.0 K/uL 7.6 8.8 7.2  Hemoglobin 12.0 - 16.0 g/dL 12.7 14.2 14.4  Hematocrit 35.0 - 47.0 % 38.3 41.9 42.5  Platelets 150 - 440 K/uL 192 221 184    CXR (11/11/15): L effusion much improved after thoracentesis. Diffuse interstitial prominence  Pleural fluid: WBC 157, LDH 56, protein < 3.0  IMPRESSION: Acute on chronic hypoxic respiratory failure CAF Diastolic CHF AKI on CKD Severe volume overload with anasarca L pleural effusion - transudative chemistries   The only way to prevent recurrence will be diuresis and/or dialysis (for which she seems a poor candidate)  PLAN/REC: Maximize volume removal in one way or another Follow CXR periodically Would consider Palliative Care consultation to address goals of care  PCCM will sign off. Please call if we can be of further assistance  Merton Border, MD PCCM service Mobile [phone] Pager [phone] 11/12/2015

## 2015-11-13 LAB — BASIC METABOLIC PANEL
ANION GAP: 2 — AB (ref 5–15)
BUN: 53 mg/dL — ABNORMAL HIGH (ref 6–20)
CALCIUM: 7.4 mg/dL — AB (ref 8.9–10.3)
CO2: 22 mmol/L (ref 22–32)
Chloride: 111 mmol/L (ref 101–111)
Creatinine, Ser: 3.64 mg/dL — ABNORMAL HIGH (ref 0.44–1.00)
GFR, EST AFRICAN AMERICAN: 13 mL/min — AB (ref >60)
GFR, EST NON AFRICAN AMERICAN: 11 mL/min — AB (ref >60)
Glucose, Bld: 117 mg/dL — ABNORMAL HIGH (ref 65–99)
Potassium: 5.5 mmol/L — ABNORMAL HIGH (ref 3.5–5.1)
SODIUM: 135 mmol/L (ref 135–145)

## 2015-11-13 LAB — GLUCOSE, CAPILLARY
GLUCOSE-CAPILLARY: 105 mg/dL — AB (ref 65–99)
GLUCOSE-CAPILLARY: 170 mg/dL — AB (ref 65–99)
Glucose-Capillary: 84 mg/dL (ref 65–99)
Glucose-Capillary: 143 mg/dL — ABNORMAL HIGH (ref 65–99)

## 2015-11-13 LAB — URINE CULTURE

## 2015-11-13 MED ORDER — ALBUMIN HUMAN 25 % IV SOLN
25.0000 g | Freq: Two times a day (BID) | INTRAVENOUS | Status: AC
  Administered 2015-11-13 (×2): 25 g via INTRAVENOUS
  Filled 2015-11-13 (×2): qty 100

## 2015-11-13 NOTE — Progress Notes (Signed)
Lisa Lowery    MR#:  [id number]  DATE OF BIRTH:  [birth year]  SUBJECTIVE:  CHIEF COMPLAINT:   Chief Complaint  Patient presents with  . Leg Swelling   admitted for shortness of breath and lower extremity swelling and found to be in atrial fibrillation with rapid ventricular rate. Left thoracentesis done and 1.7 L fluid removed on 11/11/2015.  Still has shortness of breath and weakness. On oxygen. Poor urine output. On Lasix drip.  REVIEW OF SYSTEMS:    Review of Systems  Constitutional: Positive for malaise/fatigue. Negative for fever and chills.  HENT: Negative for sore throat.   Eyes: Negative for blurred vision, double vision and pain.  Respiratory: Positive for cough and shortness of breath. Negative for hemoptysis and wheezing.   Cardiovascular: Positive for orthopnea and leg swelling. Negative for chest pain and palpitations.  Gastrointestinal: Negative for heartburn, nausea, vomiting, abdominal pain, diarrhea and constipation.  Genitourinary: Negative for dysuria and hematuria.  Musculoskeletal: Negative for back pain and joint pain.  Skin: Negative for rash.  Neurological: Positive for dizziness and weakness. Negative for sensory change, speech change, focal weakness and headaches.  Endo/Heme/Allergies: Does not bruise/bleed easily.  Psychiatric/Behavioral: Negative for depression. The patient is not nervous/anxious.     DRUG ALLERGIES:  No Known Allergies  VITALS:  Blood pressure 103/54, pulse 64, temperature 98 F (36.7 C), temperature source Oral, resp. rate 20, height 5\' 7"  (1.702 m), weight 99.745 kg (219 lb 14.4 oz), SpO2 96 %.  PHYSICAL EXAMINATION:   Physical Exam  GENERAL:  75 y.o.-year-old patient lying in the bed with no acute distress.  EYES: Pupils equal, round, reactive to light and accommodation. No scleral icterus. Extraocular muscles intact.  HEENT: Head atraumatic,  normocephalic. Oropharynx and nasopharynx clear.  NECK:  Supple, no jugular venous distention. No thyroid enlargement, no tenderness.  LUNGS: Bilateral crackles. Decreased air entry at the bases. CARDIOVASCULAR: S1, S2 normal. No murmurs, rubs, or gallops. Irregular ABDOMEN: Soft, nontender, nondistended. Bowel sounds present. No organomegaly or mass.  EXTREMITIES: No cyanosis, clubbing. 2+ lower extremity edema NEUROLOGIC: Cranial nerves II through XII are intact. No focal Motor or sensory deficits b/l.   PSYCHIATRIC: The patient is alert and oriented x 3.  SKIN: No obvious rash, lesion, or ulcer.   LABORATORY PANEL:   CBC  Recent Labs Lab 11/10/15 2014  WBC 7.6  HGB 12.7  HCT 38.3  PLT 192   ------------------------------------------------------------------------------------------------------------------ Chemistries   Recent Labs Lab 11/12/15 0407 11/13/15 0627  NA 138 135  K 5.0 5.5*  CL 111 111  CO2 23 22  GLUCOSE 118* 117*  BUN 47* 53*  CREATININE 2.97* 3.64*  CALCIUM 7.1* 7.4*  AST 38  --   ALT 29  --   ALKPHOS 53  --   BILITOT 0.6  --    ------------------------------------------------------------------------------------------------------------------  Cardiac Enzymes  Recent Labs Lab 11/11/15 1226  TROPONINI 0.08*   ------------------------------------------------------------------------------------------------------------------  RADIOLOGY:  No results found.   ASSESSMENT AND PLAN:  75 year old female patient with history of diastolic CHF, chronic troponin elevation, atrial fibrillation and noncompliance presents to the hospital with shortness of breath and lower extremity swelling.  * Acute renal failure due to likely cardiorenal syndrome Worsening creatinine due to diuresis. Increase dose of Lasix drip and albumin. Appreciate nephrology help  * Bilateral pleural effusions left greater than right likely due to congestive heart failure Status  post left thoracentesis with 1.7  L.  Transudate.  * Acute on chronic diastolic congestive heart failure with acute respiratory failure - IV Lasix drip , Beta blockers - Input and Output - Counseled to limit fluids and Salt - Monitor Bun/Cr and Potassium -Cardiology follow up  * A. Fib with RVR Rate controlled with metoprolol. Cardizem discontinued.  * UTI On IV ceftriaxone  * Hypertension On metoprolol  * Diabetes mellitus Sliding scale insulin  * Chronic mild elevation of troponin is stable  All the records are reviewed and case discussed with Care Management/Social Workerr. Management plans discussed with the patient, family and they are in agreement.  CODE STATUS: FULL CODE  DVT Prophylaxis: SCDs  TOTAL TIME TAKING CARE OF THIS PATIENT: 35 minutes.   POSSIBLE D/C IN 2-3 DAYS, DEPENDING ON CLINICAL CONDITION.  Hillary Bow R M.D on 11/13/2015 at 12:45 PM  Between 7am to 6pm - Pager - [phone]  After 6pm go to [email or web address] - password [email or web address] [facility]  [place] [facility]  Office  [phone]  CC: Primary care physician; No PCP Per Patient  Note: This dictation was prepared with Dragon dictation along with smaller phrase technology. Any transcriptional errors that result from this process are unintentional.

## 2015-11-13 NOTE — Progress Notes (Signed)
[facility]  SUBJECTIVE: I feel better   Filed Vitals:   11/12/15 1700 11/12/15 1927 11/13/15 0455 11/13/15 0811  BP: 100/51 76/60 117/57 109/58  Pulse: 78 96 71 80  Temp:  98.2 F (36.8 C) 98.4 F (36.9 C) 97.6 F (36.4 C)  TempSrc:   Oral Oral  Resp: 18 18 16 18   Height:      Weight:   99.745 kg (219 lb 14.4 oz)   SpO2: 93% 92% 95% 96%     Intake/Output Summary (Last 24 hours) at 11/13/15 1040 Last data filed at 11/13/15 1028  Gross per 24 hour  Intake    360 ml  Output    100 ml  Net    260 ml      PHYSICAL EXAM  General: Well developed, well nourished, in no acute distress HEENT:  Normocephalic and atramatic Neck:  No JVD.  Lungs: Clear bilaterally to auscultation and percussion. Heart: HRRR . Normal S1 and S2 without gallops or murmurs.  Abdomen: Bowel sounds are positive, abdomen soft and non-tender  Msk:  Back normal, normal gait. Normal strength and tone for age. Extremities: No clubbing, cyanosis or edema.   Neuro: Alert and oriented X 3. Psych:  Good affect, responds appropriately   LABS: Basic Metabolic Panel:  Recent Labs  11/12/15 0407 11/13/15 0627  NA 138 135  K 5.0 5.5*  CL 111 111  CO2 23 22  GLUCOSE 118* 117*  BUN 47* 53*  CREATININE 2.97* 3.64*  CALCIUM 7.1* 7.4*   Liver Function Tests:  Recent Labs  11/10/15 2014 11/12/15 0407  AST 64* 38  ALT 38 29  ALKPHOS 72 53  BILITOT 0.6 0.6  PROT 7.8 6.4*  ALBUMIN 1.4* 1.2*   No results for input(s): LIPASE, AMYLASE in the last 72 hours. CBC:  Recent Labs  11/10/15 2014  WBC 7.6  NEUTROABS 5.3  HGB 12.7  HCT 38.3  MCV 89.2  PLT 192   Cardiac Enzymes:  Recent Labs  11/11/15 0017 11/11/15 0406 11/11/15 1226  TROPONINI 0.08* 0.11* 0.08*   BNP: Invalid input(s): POCBNP D-Dimer: No results for input(s): DDIMER in the last 72 hours. Hemoglobin A1C: No results for input(s): HGBA1C in the last 72 hours. Fasting Lipid Panel: No results for input(s): CHOL, HDL,  LDLCALC, TRIG, CHOLHDL, LDLDIRECT in the last 72 hours. Thyroid Function Tests: No results for input(s): TSH, T4TOTAL, T3FREE, THYROIDAB in the last 72 hours.  Invalid input(s): FREET3 Anemia Panel: No results for input(s): VITAMINB12, FOLATE, FERRITIN, TIBC, IRON, RETICCTPCT in the last 72 hours.  Dg Chest 1 View  11/11/2015  CLINICAL DATA:  Status post left thoracentesis. EXAM: CHEST 1 VIEW COMPARISON:  11/10/2015 FINDINGS: After thoracentesis, no significant discernible remaining pleural fluid is seen on the left. The lung is re-expanded. No evidence of pneumothorax. No pulmonary edema. No pleural fluid is seen on the right. IMPRESSION: Significant reduction in left pleural fluid volume after thoracentesis with re-expansion of the left lung. No pneumothorax is seen following the procedure. Electronically Signed   By: Aletta Edouard M.D.   On: 11/11/2015 15:47   US Renal  11/11/2015  CLINICAL DATA:  Chronic renal disease. EXAM: RENAL / URINARY TRACT ULTRASOUND COMPLETE COMPARISON:  None. FINDINGS: Right Kidney: Length: 12.6 cm. Normal renal cortical thickness and echogenicity. No hydronephrosis. There is a bilobed cyst within the renal pelvis measuring 3.9 x 3.0 x 3.7 cm Left Kidney: Length: 12.4 cm. Normal renal cortical thickness and echogenicity. No hydronephrosis. There is  a 2.1 x 1.8 x 2.4 cm cyst within the interpolar region of the left kidney. Bladder: Appears normal for degree of bladder distention. IMPRESSION: No hydronephrosis. Bilateral renal cysts. Electronically Signed   By: Lovey Newcomer M.D.   On: 11/11/2015 11:06   US Thoracentesis Asp Pleural Space W/img Guide  11/11/2015  CLINICAL DATA:  Large left pleural effusion and dyspnea requiring thoracentesis. EXAM: ULTRASOUND GUIDED LEFT THORACENTESIS COMPARISON:  Chest x-ray on 11/10/2015 PROCEDURE: An ultrasound guided thoracentesis was thoroughly discussed with the patient and questions answered. The benefits, risks, alternatives and  complications were also discussed. The patient understands and wishes to proceed with the procedure. Written consent was obtained. A time-out was performed prior to the procedure. Ultrasound was performed to localize and mark an adequate pocket of fluid in the left posterior chest. The area was then prepped and draped in the normal sterile fashion. 1% Lidocaine was used for local anesthesia. Under ultrasound guidance a 6 French Safe-T-Centesis catheter was introduced. Thoracentesis was performed. The catheter was removed and a dressing applied. COMPLICATIONS: None FINDINGS: A total of approximately 1.7 L of yellowish fluid was removed. A fluid sample was sent for laboratory analysis. Postprocedure ultrasound shows no significant residual pleural fluid remaining. IMPRESSION: Successful ultrasound guided left thoracentesis yielding 1.7 L of pleural fluid. Electronically Signed   By: Aletta Edouard M.D.   On: 11/11/2015 15:49     Echo normal left ventricular function by echocardiogram 09/11/2015 with LV ejection fraction of 55-60%.  TELEMETRY: Atrial fibrillation:  ASSESSMENT AND PLAN:  Principal Problem:   Pleural effusion, left Active Problems:   Elevated troponin   HTN (hypertension)   Atrial fibrillation with RVR (HCC)   General weakness   DM2 (diabetes mellitus, type 2) ([place])    1. Atrial fibrillation, rate controlled, chads Vasc 5, previously on Eliquis, with medical noncompliance 2. Peripheral edema, multifactorial, known normal left ventricular function, secondary to acute on chronic diastolic congestive heart failure, chronic kidney disease, venous insufficiency, medical and dietary noncompliance, appears to be responding to furosemide drip 3. Borderline elevated troponin, likely due to demand supply ischemia, not due to acute coronary syndrome 4. Chronic left pleural effusion status post thoracentesis  Recommendations  1. Agree with overall current therapy 2. Continue furosemide  drip 3. Continue Cardizem CD and metoprolol titrate for rate control of atrial fibrillation 4. Hesitant to start chronic anticoagulation in light of patient's recent history of dietary and medical noncompliance. This could be initiated as outpatient.  Signed off for now, please call if any questions  Dorrell Mitcheltree, MD, PhD, [facility] 11/13/2015 10:40 AM

## 2015-11-13 NOTE — Progress Notes (Signed)
[facility]  ROUNDING NOTE   Subjective:   UOP recorded at 100 q 24 hours with foley catheter. Patient disputes this claim. She states she has urinated more.   Objective:  Vital signs in last 24 hours:  Temp:  [97.5 F (36.4 C)-98.4 F (36.9 C)] 97.6 F (36.4 C) (04/01 0811) Pulse Rate:  [71-96] 80 (04/01 0811) Resp:  [16-22] 18 (04/01 0811) BP: (76-117)/(45-60) 109/58 mmHg (04/01 0811) SpO2:  [91 %-96 %] 96 % (04/01 0811) Weight:  [99.745 kg (219 lb 14.4 oz)] 99.745 kg (219 lb 14.4 oz) (04/01 0455)  Weight change: -0.239 kg (-8.4 oz) Filed Weights   11/11/15 0429 11/12/15 0423 11/13/15 0455  Weight: 100.88 kg (222 lb 6.4 oz) 99.984 kg (220 lb 6.8 oz) 99.745 kg (219 lb 14.4 oz)    Intake/Output: I/O last 3 completed shifts: In: 240 [P.O.:240] Out: 200 [Urine:200]   Intake/Output this shift:     Physical Exam: General: NAD, sitting up in bed  Head: Normocephalic, atraumatic. Moist oral mucosal membranes  Eyes: Anicteric, PERRL  Neck: Supple, trachea midline  Lungs:  Crackles bilaterally  Heart: Regular rate and rhythm  Abdomen:  Soft, nontender,   Extremities: + peripheral edema.  Neurologic: Nonfocal, moving all four extremities  Skin: No lesions       Basic Metabolic Panel:  Recent Labs Lab 11/10/15 2014 11/12/15 0407 11/13/15 0627  NA 137 138 135  K 4.9 5.0 5.5*  CL 111 111 111  CO2 23 23 22   GLUCOSE 144* 118* 117*  BUN 40* 47* 53*  CREATININE 2.33* 2.97* 3.64*  CALCIUM 7.4* 7.1* 7.4*    Liver Function Tests:  Recent Labs Lab 11/10/15 2014 11/12/15 0407  AST 64* 38  ALT 38 29  ALKPHOS 72 53  BILITOT 0.6 0.6  PROT 7.8 6.4*  ALBUMIN 1.4* 1.2*   No results for input(s): LIPASE, AMYLASE in the last 168 hours. No results for input(s): AMMONIA in the last 168 hours.  CBC:  Recent Labs Lab 11/10/15 2014  WBC 7.6  NEUTROABS 5.3  HGB 12.7  HCT 38.3  MCV 89.2  PLT 192    Cardiac Enzymes:  Recent Labs Lab  11/10/15 2014 11/11/15 0017 11/11/15 0406 11/11/15 1226  TROPONINI 0.08* 0.08* 0.11* 0.08*    BNP: Invalid input(s): POCBNP  CBG:  Recent Labs Lab 11/12/15 0726 11/12/15 1128 11/12/15 1611 11/12/15 2044 11/13/15 0722  GLUCAP 100* 133* 132* 131* 105*    Microbiology: Results for orders placed or performed during the hospital encounter of 11/10/15  Urine culture     Status: None   Collection Time: 11/11/15  3:44 AM  Result Value Ref Range Status   Specimen Description URINE, RANDOM  Final   Special Requests NONE  Final   Culture MULTIPLE SPECIES PRESENT, SUGGEST RECOLLECTION  Final   Report Status 11/13/2015 FINAL  Final  Culture, body fluid-bottle     Status: None (Preliminary result)   Collection Time: 11/11/15 11:45 AM  Result Value Ref Range Status   Specimen Description PLEURAL  Final   Special Requests NONE  Final   Gram Stain FEW WBC SEEN NO ORGANISMS SEEN   Final   Culture NO GROWTH 1 DAY  Final   Report Status PENDING  Incomplete    Coagulation Studies: No results for input(s): LABPROT, INR in the last 72 hours.  Urinalysis:  Recent Labs  11/11/15 0344  COLORURINE YELLOW*  LABSPEC 1.015  PHURINE 5.0  GLUCOSEU NEGATIVE  HGBUR 2+*  BILIRUBINUR NEGATIVE  KETONESUR NEGATIVE  PROTEINUR >500*  NITRITE NEGATIVE  LEUKOCYTESUR TRACE*      Imaging: Dg Chest 1 View  11/11/2015  CLINICAL DATA:  Status post left thoracentesis. EXAM: CHEST 1 VIEW COMPARISON:  11/10/2015 FINDINGS: After thoracentesis, no significant discernible remaining pleural fluid is seen on the left. The lung is re-expanded. No evidence of pneumothorax. No pulmonary edema. No pleural fluid is seen on the right. IMPRESSION: Significant reduction in left pleural fluid volume after thoracentesis with re-expansion of the left lung. No pneumothorax is seen following the procedure. Electronically Signed   By: Aletta Edouard M.D.   On: 11/11/2015 15:47   US Renal  11/11/2015  CLINICAL  DATA:  Chronic renal disease. EXAM: RENAL / URINARY TRACT ULTRASOUND COMPLETE COMPARISON:  None. FINDINGS: Right Kidney: Length: 12.6 cm. Normal renal cortical thickness and echogenicity. No hydronephrosis. There is a bilobed cyst within the renal pelvis measuring 3.9 x 3.0 x 3.7 cm Left Kidney: Length: 12.4 cm. Normal renal cortical thickness and echogenicity. No hydronephrosis. There is a 2.1 x 1.8 x 2.4 cm cyst within the interpolar region of the left kidney. Bladder: Appears normal for degree of bladder distention. IMPRESSION: No hydronephrosis. Bilateral renal cysts. Electronically Signed   By: Lovey Newcomer M.D.   On: 11/11/2015 11:06   US Thoracentesis Asp Pleural Space W/img Guide  11/11/2015  CLINICAL DATA:  Large left pleural effusion and dyspnea requiring thoracentesis. EXAM: ULTRASOUND GUIDED LEFT THORACENTESIS COMPARISON:  Chest x-ray on 11/10/2015 PROCEDURE: An ultrasound guided thoracentesis was thoroughly discussed with the patient and questions answered. The benefits, risks, alternatives and complications were also discussed. The patient understands and wishes to proceed with the procedure. Written consent was obtained. A time-out was performed prior to the procedure. Ultrasound was performed to localize and mark an adequate pocket of fluid in the left posterior chest. The area was then prepped and draped in the normal sterile fashion. 1% Lidocaine was used for local anesthesia. Under ultrasound guidance a 6 French Safe-T-Centesis catheter was introduced. Thoracentesis was performed. The catheter was removed and a dressing applied. COMPLICATIONS: None FINDINGS: A total of approximately 1.7 L of yellowish fluid was removed. A fluid sample was sent for laboratory analysis. Postprocedure ultrasound shows no significant residual pleural fluid remaining. IMPRESSION: Successful ultrasound guided left thoracentesis yielding 1.7 L of pleural fluid. Electronically Signed   By: Aletta Edouard M.D.   On:  11/11/2015 15:49     Medications:   . furosemide (LASIX) infusion 10 mg/hr (11/13/15 0758)   . albumin human  25 g Intravenous BID  . aspirin EC  81 mg Oral Daily  . cefTRIAXone (ROCEPHIN)  IV  1 g Intravenous Q24H  . insulin aspart  0-15 Units Subcutaneous TID WC  . metoprolol tartrate  25 mg Oral BID  . sodium chloride flush  3 mL Intravenous Q12H   acetaminophen  Assessment/ Plan:  Ms. Havah Ammon is a 75 y.o. white female with atrial fibrillation, diabetes mellitus type II, hypertension, vertigo, coronary artery disease, who was admitted to [facility] on 11/10/2015 with left pleural effusion. Status post left thoracentesis on 11/11/15  1. Acute renal failure on chronic kidney disease stage III with proteinuria: baseline creatinine 1.25 eGFR 41 from 09/11/15. Acute renal failure from acute cardiorenal syndrome. Recent cardiac procedure. Not on diuretics or low salt diet at home. However suspect patient's creatinine fluctuates with volume status - hold lisinopril - furosemide gtt due to hypotension, increase to 52m/hr - IV albumin, increase  to 25gm for two doses.  - if no improvement, will consider dopamine gtt and/or will consider dialysis. Patient is unsure if she wants dialysis.   2. Hypertension: with atrial fibrillation. Significant peripheral edema. Echo from 1/17 reviewed.  - metoprolol for rate control.  - furosemide gtt as above. Iv albumin.   3. Diabetes mellitus type II Insulin dependent with chronic kidney disease: hemoglobin A1c from 09/11/15 5.1% - Continue glucose control  4. Urinary Tract Infection: cultures with multiple organisms - ceftriaxone  5. Left pleural effusion: status post ultrasound guided thoracentesis on 3/30 with 1.7 litres removed.    LOS: [place], [place] 4/1/20179:10 AM

## 2015-11-13 NOTE — Evaluation (Signed)
Physical Therapy Evaluation Patient Details Name: Lisa Lowery MRN: [id number] DOB: 1940-11-22 Today's Date: 11/13/2015   History of Present Illness  Patient is a 75 y/o female admitted with LE swelling, shortness of breath, a-fib with RVR. Noted to have demand ischemia. Had L thoracentesis on 3/30 to remove 1.7 L  Clinical Impression  Patient reports she called EMS after ambulating to bathroom and being unable to stand independently off the commode. She is not very active at baseline, however she is unable to transfer sit to stand without considerable assistance both from bed and bedside commode in this session. She also fatigues rather quickly with ambulation and is able to make it to the bedside chair with close assistance from PT as she takes very minimal step lengths. Given the above patient appears appropriate for short term rehab to facilitate safe transition home from acute hospitalization.     Follow Up Recommendations SNF    Equipment Recommendations       Recommendations for Other Services       Precautions / Restrictions Precautions Precautions: Fall Restrictions Weight Bearing Restrictions: No      Mobility  Bed Mobility Overal bed mobility: Needs Assistance Bed Mobility: Supine to Sit     Supine to sit: Min guard;Min assist     General bed mobility comments: Patient requires some assistance to elevate trunk off the bed surface, as well as transferring her LEs to the edge of the bed.   Transfers Overall transfer level: Needs assistance Equipment used: Rolling walker (2 wheeled) Transfers: Sit to/from Stand Sit to Stand: Mod assist;Min assist         General transfer comment: Patient requires 2 attempts to stand from bed and bedside commode with min-mod A x1 to complete both.   Ambulation/Gait Ambulation/Gait assistance: Min guard Ambulation Distance (Feet): 5 Feet Assistive device: Rolling walker (2 wheeled)     Gait velocity interpretation: Below  normal speed for age/gender General Gait Details: Very short steps noted with increased fatigue and poor planning (requires PT to instruct where to navigate). Trunk flexion noted as well, no buckling observed though she reports her RLE buckles.   Stairs            Wheelchair Mobility    Modified Rankin (Stroke Patients Only)       Balance Overall balance assessment: Needs assistance Sitting-balance support: Bilateral upper extremity supported Sitting balance-Leahy Scale: Good     Standing balance support: Bilateral upper extremity supported Standing balance-Leahy Scale: Fair Standing balance comment: No overt loss of balance in standing, however she does not appear safe without PT assistance.                              Pertinent Vitals/Pain Pain Assessment:  (She reports a very transient (1 second) of chest pain, she complains of R hip/leg pain which appears to be chronic. )    Home Living Family/patient expects to be discharged to:: Private residence Living Arrangements: Children Available Help at Discharge: Family Type of Home: Apartment Home Access: Stairs to enter   Technical brewer of Steps: 4 Home Layout: One level Home Equipment: Walker - 2 wheels Additional Comments: Pt. now resides with her son in his apartment. Pt. reports that her son in never, or rarely there.    Prior Function Level of Independence: Independent with assistive device(s)         Comments: Patient reports she uses a RW primarily for  short distances in the house, otherwise she sits throughout the day.      Hand Dominance        Extremity/Trunk Assessment   Upper Extremity Assessment: Overall WFL for tasks assessed           Lower Extremity Assessment:  (Some weakness in R hip flexor secondary to poor effort/pain?)         Communication   Communication: No difficulties  Cognition Arousal/Alertness: Awake/alert Behavior During Therapy: Anxious;WFL for  tasks assessed/performed Overall Cognitive Status: Within Functional Limits for tasks assessed                      General Comments      Exercises        Assessment/Plan    PT Assessment Patient needs continued PT services  PT Diagnosis Difficulty walking;Generalized weakness   PT Problem List Decreased strength;Decreased knowledge of use of DME;Decreased activity tolerance;Decreased balance;Cardiopulmonary status limiting activity;Decreased mobility  PT Treatment Interventions DME instruction;Gait training;Stair training;Therapeutic activities;Therapeutic exercise;Balance training   PT Goals (Current goals can be found in the Care Plan section) Acute Rehab PT Goals Patient Stated Goal: To return regain independence PT Goal Formulation: With patient Time For Goal Achievement: 11/27/15 Potential to Achieve Goals: Good    Frequency Min 2X/week   Barriers to discharge Decreased caregiver support Patient has help only at night and appears to need help with standing independently. She called EMS after being unable to stand off the commode at home.     Co-evaluation               End of Session Equipment Utilized During Treatment: Gait belt;Oxygen Activity Tolerance: Patient tolerated treatment well;Patient limited by fatigue Patient left: in chair;with call bell/phone within reach;with chair alarm set;with nursing/sitter in room Nurse Communication: Mobility status         Time: [id number] PT Time Calculation (min) (ACUTE ONLY): 28 min   Charges:   PT Evaluation $PT Eval Moderate Complexity: 1 Procedure PT Treatments $Therapeutic Activity: 8-22 mins (Assisted with transfer to bedside commode. )   PT G Codes:       Kerman Passey, PT, DPT    11/13/2015, 5:37 PM

## 2015-11-14 ENCOUNTER — Inpatient Hospital Stay: Payer: Medicare Other

## 2015-11-14 LAB — RENAL FUNCTION PANEL
Albumin: 2.2 g/dL — ABNORMAL LOW (ref 3.5–5.0)
Anion gap: 7 (ref 5–15)
BUN: 60 mg/dL — AB (ref 6–20)
CALCIUM: 7.9 mg/dL — AB (ref 8.9–10.3)
CHLORIDE: 107 mmol/L (ref 101–111)
CO2: 21 mmol/L — AB (ref 22–32)
CREATININE: 4.02 mg/dL — AB (ref 0.44–1.00)
GFR calc Af Amer: 11 mL/min — ABNORMAL LOW (ref >60)
GFR calc non Af Amer: 10 mL/min — ABNORMAL LOW (ref >60)
Glucose, Bld: 129 mg/dL — ABNORMAL HIGH (ref 65–99)
Phosphorus: 5.1 mg/dL — ABNORMAL HIGH (ref 2.5–4.6)
Potassium: 5.1 mmol/L (ref 3.5–5.1)
SODIUM: 135 mmol/L (ref 135–145)

## 2015-11-14 LAB — BASIC METABOLIC PANEL
Anion gap: 5 (ref 5–15)
BUN: 60 mg/dL — AB (ref 6–20)
CALCIUM: 7.9 mg/dL — AB (ref 8.9–10.3)
CHLORIDE: 108 mmol/L (ref 101–111)
CO2: 21 mmol/L — AB (ref 22–32)
CREATININE: 4.22 mg/dL — AB (ref 0.44–1.00)
GFR calc Af Amer: 11 mL/min — ABNORMAL LOW (ref >60)
GFR calc non Af Amer: 9 mL/min — ABNORMAL LOW (ref >60)
GLUCOSE: 127 mg/dL — AB (ref 65–99)
Potassium: 5.1 mmol/L (ref 3.5–5.1)
Sodium: 134 mmol/L — ABNORMAL LOW (ref 135–145)

## 2015-11-14 LAB — GLUCOSE, CAPILLARY
GLUCOSE-CAPILLARY: 151 mg/dL — AB (ref 65–99)
Glucose-Capillary: 119 mg/dL — ABNORMAL HIGH (ref 65–99)
Glucose-Capillary: 110 mg/dL — ABNORMAL HIGH (ref 65–99)
Glucose-Capillary: 93 mg/dL (ref 65–99)

## 2015-11-14 NOTE — Progress Notes (Signed)
[facility] [place] NAME: Lisa Lowery    MR#:  [id number]  DATE OF BIRTH:  10-26-1940  SUBJECTIVE:  CHIEF COMPLAINT:   Chief Complaint  Patient presents with  . Leg Swelling   admitted for shortness of breath and lower extremity swelling and found to be in atrial fibrillation with rapid ventricular rate. Left thoracentesis done and 1.7 L fluid removed on 11/11/2015.  Since she is not feeling good. Some shortness of breath. Feels weak.  REVIEW OF SYSTEMS:    Review of Systems  Constitutional: Positive for malaise/fatigue. Negative for fever and chills.  HENT: Negative for sore throat.   Eyes: Negative for blurred vision, double vision and pain.  Respiratory: Positive for cough and shortness of breath. Negative for hemoptysis and wheezing.   Cardiovascular: Positive for orthopnea and leg swelling. Negative for chest pain and palpitations.  Gastrointestinal: Negative for heartburn, nausea, vomiting, abdominal pain, diarrhea and constipation.  Genitourinary: Negative for dysuria and hematuria.  Musculoskeletal: Negative for back pain and joint pain.  Skin: Negative for rash.  Neurological: Positive for dizziness and weakness. Negative for sensory change, speech change, focal weakness and headaches.  Endo/Heme/Allergies: Does not bruise/bleed easily.  Psychiatric/Behavioral: Negative for depression. The patient is not nervous/anxious.     DRUG ALLERGIES:  No Known Allergies  VITALS:  Blood pressure 111/54, pulse 85, temperature 97.5 F (36.4 C), temperature source Oral, resp. rate 18, height 5\' 7"  (1.702 m), weight 102.059 kg (225 lb), SpO2 96 %.  PHYSICAL EXAMINATION:   Physical Exam  GENERAL:  75 y.o.-year-old patient lying in the bed with conversational dyspnea. Anasarca EYES: Pupils equal, round, reactive to light and accommodation. No scleral icterus. Extraocular muscles intact.  HEENT: Head atraumatic, normocephalic.  Oropharynx and nasopharynx clear.  NECK:  Supple, no jugular venous distention. No thyroid enlargement, no tenderness.  LUNGS: Bilateral crackles. Decreased air entry at the bases. CARDIOVASCULAR: S1, S2 normal. No murmurs, rubs, or gallops. Irregular ABDOMEN: Soft, nontender, nondistended. Bowel sounds present. No organomegaly or mass.  EXTREMITIES: No cyanosis, clubbing. 2+ lower extremity edema NEUROLOGIC: Cranial nerves II through XII are intact. No focal Motor or sensory deficits b/l.   PSYCHIATRIC: The patient is alert and oriented x 3. Flat affect SKIN: No obvious rash, lesion, or ulcer.   LABORATORY PANEL:   CBC  Recent Labs Lab 11/10/15 2014  WBC 7.6  HGB 12.7  HCT 38.3  PLT 192   ------------------------------------------------------------------------------------------------------------------ Chemistries   Recent Labs Lab 11/12/15 0407  11/14/15 0510  NA 138  < > 134*  135  K 5.0  < > 5.1  5.1  CL 111  < > 108  107  CO2 23  < > 21*  21*  GLUCOSE 118*  < > 127*  129*  BUN 47*  < > 60*  60*  CREATININE 2.97*  < > 4.22*  4.02*  CALCIUM 7.1*  < > 7.9*  7.9*  AST 38  --   --   ALT 29  --   --   ALKPHOS 53  --   --   BILITOT 0.6  --   --   < > = values in this interval not displayed. ------------------------------------------------------------------------------------------------------------------  Cardiac Enzymes  Recent Labs Lab 11/11/15 1226  TROPONINI 0.08*   ------------------------------------------------------------------------------------------------------------------  RADIOLOGY:  No results found.   ASSESSMENT AND PLAN:  75 year old female patient with history of diastolic CHF, chronic troponin elevation, atrial fibrillation and noncompliance presents to the  hospital with shortness of breath and lower extremity swelling.  * Acute renal failure due to likely cardiorenal syndrome - worsening Worsening creatinine due to diuresis. Lasix  stopped due to worsening renal function. Patient is not keen on getting started on dialysis. Appreciate nephrology help  * Bilateral pleural effusions left greater than right likely due to congestive heart failure Status post left thoracentesis with 1.7 L.  Transudate.  * Acute on chronic diastolic congestive heart failure with acute respiratory failure - worsening - Stopped Lasix due to worsening renal function - Beta blockers - Input and Output - Counseled to limit fluids and Salt - Monitor Bun/Cr and Potassium We'll check a chest x-ray today.  * A. Fib with RVR Rate controlled with metoprolol. Cardizem discontinued.  * UTI On IV ceftriaxone  * Hypertension On metoprolol  * Diabetes mellitus Sliding scale insulin  * Chronic mild elevation of troponin is stable  All the records are reviewed and case discussed with Care Management/Social Workerr. Management plans discussed with the patient, family and they are in agreement.  Discussed with patient regarding her health care power attorney. She has 3 sons living in [place], [place], [place]. She states she wants to make HER decisions and does not want her sons involved.  CODE STATUS: FULL CODE  DVT Prophylaxis: SCDs  TOTAL TIME TAKING CARE OF THIS PATIENT: 35 minutes.   POSSIBLE D/C IN 2-3 DAYS, DEPENDING ON CLINICAL CONDITION.  Hillary Bow R M.D on 11/14/2015 at 11:25 AM  Between 7am to 6pm - Pager - [phone]  After 6pm go to [email or web address] - password [email or web address] [facility]  [facility]  Office  [phone]  CC: Primary care physician; No PCP Per Patient  Note: This dictation was prepared with Dragon dictation along with smaller phrase technology. Any transcriptional errors that result from this process are unintentional.

## 2015-11-14 NOTE — Progress Notes (Signed)
1 L of oxygen. A fib. Takes meds ok. FS are stable. Foley. IV lasix gtt stopped due to increasing BUN/Cr. Pt has no further concerns at this time.

## 2015-11-14 NOTE — Progress Notes (Signed)
[facility]  ROUNDING NOTE   Subjective:   Furosemide gtt - oliguric to anuric. Continues to have anasarca Creatinine 4.22 (3.64)  Objective:  Vital signs in last 24 hours:  Temp:  [97.4 F (36.3 C)-98 F (36.7 C)] 97.5 F (36.4 C) (04/02 0353) Pulse Rate:  [64-85] 85 (04/02 0353) Resp:  [16-20] 18 (04/02 0353) BP: (103-111)/(46-54) 111/54 mmHg (04/02 0353) SpO2:  [96 %-100 %] 96 % (04/02 0353) Weight:  [102.059 kg (225 lb)] 102.059 kg (225 lb) (04/02 0353)  Weight change: 2.314 kg (5 lb 1.6 oz) Filed Weights   11/12/15 0423 11/13/15 0455 11/14/15 0353  Weight: 99.984 kg (220 lb 6.8 oz) 99.745 kg (219 lb 14.4 oz) 102.059 kg (225 lb)    Intake/Output: I/O last 3 completed shifts: In: 70 [P.O.:600; I.V.:120; IV Piggyback:100] Out: 300 [Urine:300]   Intake/Output this shift:     Physical Exam: General: NAD, sitting up in bed  Head: Normocephalic, atraumatic. Moist oral mucosal membranes  Eyes: Anicteric, PERRL  Neck: Supple, trachea midline  Lungs:  Crackles bilaterally  Heart: Regular rate and rhythm  Abdomen:  Soft, nontender,   Extremities: + peripheral edema.  Neurologic: Nonfocal, moving all four extremities  Skin: No lesions       Basic Metabolic Panel:  Recent Labs Lab 11/10/15 2014 11/12/15 0407 11/13/15 0627 11/14/15 0510  NA 137 138 135 134*  135  K 4.9 5.0 5.5* 5.1  5.1  CL 111 111 111 108  107  CO2 23 23 22  21*  21*  GLUCOSE 144* 118* 117* 127*  129*  BUN 40* 47* 53* 60*  60*  CREATININE 2.33* 2.97* 3.64* 4.22*  4.02*  CALCIUM 7.4* 7.1* 7.4* 7.9*  7.9*  PHOS  --   --   --  5.1*    Liver Function Tests:  Recent Labs Lab 11/10/15 2014 11/12/15 0407 11/14/15 0510  AST 64* 38  --   ALT 38 29  --   ALKPHOS 72 53  --   BILITOT 0.6 0.6  --   PROT 7.8 6.4*  --   ALBUMIN 1.4* 1.2* 2.2*   No results for input(s): LIPASE, AMYLASE in the last 168 hours. No results for input(s): AMMONIA in the last 168  hours.  CBC:  Recent Labs Lab 11/10/15 2014  WBC 7.6  NEUTROABS 5.3  HGB 12.7  HCT 38.3  MCV 89.2  PLT 192    Cardiac Enzymes:  Recent Labs Lab 11/10/15 2014 11/11/15 0017 11/11/15 0406 11/11/15 1226  TROPONINI 0.08* 0.08* 0.11* 0.08*    BNP: Invalid input(s): POCBNP  CBG:  Recent Labs Lab 11/13/15 0722 11/13/15 1117 11/13/15 1624 11/13/15 2043 11/14/15 0741  GLUCAP 105* 170* 74 143* 119*    Microbiology: Results for orders placed or performed during the hospital encounter of 11/10/15  Urine culture     Status: None   Collection Time: 11/11/15  3:44 AM  Result Value Ref Range Status   Specimen Description URINE, RANDOM  Final   Special Requests NONE  Final   Culture MULTIPLE SPECIES PRESENT, SUGGEST RECOLLECTION  Final   Report Status 11/13/2015 FINAL  Final  Culture, body fluid-bottle     Status: None (Preliminary result)   Collection Time: 11/11/15 11:45 AM  Result Value Ref Range Status   Specimen Description PLEURAL  Final   Special Requests NONE  Final   Gram Stain FEW WBC SEEN NO ORGANISMS SEEN   Final   Culture NO GROWTH 2 DAYS  Final  Report Status PENDING  Incomplete    Coagulation Studies: No results for input(s): LABPROT, INR in the last 72 hours.  Urinalysis: No results for input(s): COLORURINE, LABSPEC, PHURINE, GLUCOSEU, HGBUR, BILIRUBINUR, KETONESUR, PROTEINUR, UROBILINOGEN, NITRITE, LEUKOCYTESUR in the last 72 hours.  Invalid input(s): APPERANCEUR    Imaging: No results found.   Medications:     . aspirin EC  81 mg Oral Daily  . cefTRIAXone (ROCEPHIN)  IV  1 g Intravenous Q24H  . insulin aspart  0-15 Units Subcutaneous TID WC  . metoprolol tartrate  25 mg Oral BID  . sodium chloride flush  3 mL Intravenous Q12H   acetaminophen  Assessment/ Plan:  [name] is a 75 y.o. white female with atrial fibrillation, diabetes mellitus type II, hypertension, vertigo, coronary artery disease, who was admitted to [facility]  on 11/10/2015 with left pleural effusion. Status post left thoracentesis on 11/11/15  1. Acute renal failure on chronic kidney disease stage III with proteinuria: baseline creatinine 1.25 eGFR 41 from 09/11/15. Acute renal failure from acute cardiorenal syndrome. Recent cardiac procedure. Not on diuretics or low salt diet at home. However suspect patient's creatinine fluctuates with volume status. Fear that now with overdiuresis.  - hold lisinopril - Holding furosemide, patient with volume overload. Status post Albumin IV administration.  - if no improvement, will consider dopamine gtt and/or will consider dialysis. Patient is unsure if she wants dialysis.   2. Hypertension: with atrial fibrillation. Significant peripheral edema. Echo from 1/17 reviewed, diastolic dysfunction. With anasarca on examination - metoprolol for rate control.  - Holding furosemide and albumin.    3. Diabetes mellitus type II Insulin dependent with chronic kidney disease: hemoglobin A1c from 09/11/15 5.1% - Continue glucose control  4. Urinary Tract Infection: cultures with multiple organisms - ceftriaxone  5. Left pleural effusion: status post ultrasound guided thoracentesis on 3/30 with 1.7 litres removed.    LOS: [place], Lisa Lowery 4/2/20177:22 AM

## 2015-11-15 ENCOUNTER — Inpatient Hospital Stay: Payer: Medicare Other

## 2015-11-15 LAB — HEPATIC FUNCTION PANEL
ALBUMIN: 1.9 g/dL — AB (ref 3.5–5.0)
ALK PHOS: 64 U/L (ref 38–126)
ALT: 38 U/L (ref 14–54)
AST: 46 U/L — AB (ref 15–41)
BILIRUBIN TOTAL: 0.5 mg/dL (ref 0.3–1.2)
Bilirubin, Direct: 0.1 mg/dL (ref 0.1–0.5)
Indirect Bilirubin: 0.4 mg/dL (ref 0.3–0.9)
TOTAL PROTEIN: 7.1 g/dL (ref 6.5–8.1)

## 2015-11-15 LAB — BASIC METABOLIC PANEL
Anion gap: 6 (ref 5–15)
BUN: 67 mg/dL — AB (ref 6–20)
CHLORIDE: 107 mmol/L (ref 101–111)
CO2: 21 mmol/L — AB (ref 22–32)
CREATININE: 4.83 mg/dL — AB (ref 0.44–1.00)
Calcium: 7.8 mg/dL — ABNORMAL LOW (ref 8.9–10.3)
GFR calc Af Amer: 9 mL/min — ABNORMAL LOW (ref >60)
GFR calc non Af Amer: 8 mL/min — ABNORMAL LOW (ref >60)
Glucose, Bld: 117 mg/dL — ABNORMAL HIGH (ref 65–99)
Potassium: 5.3 mmol/L — ABNORMAL HIGH (ref 3.5–5.1)
Sodium: 134 mmol/L — ABNORMAL LOW (ref 135–145)

## 2015-11-15 LAB — GLUCOSE, CAPILLARY
GLUCOSE-CAPILLARY: 107 mg/dL — AB (ref 65–99)
Glucose-Capillary: 116 mg/dL — ABNORMAL HIGH (ref 65–99)
Glucose-Capillary: 140 mg/dL — ABNORMAL HIGH (ref 65–99)
Glucose-Capillary: 100 mg/dL — ABNORMAL HIGH (ref 65–99)

## 2015-11-15 MED ORDER — ADULT MULTIVITAMIN W/MINERALS CH
1.0000 | ORAL_TABLET | Freq: Every day | ORAL | Status: DC
  Administered 2015-11-15 – 2015-11-22 (×7): 1 via ORAL
  Filled 2015-11-15 (×8): qty 1

## 2015-11-15 MED ORDER — NEPRO/CARBSTEADY PO LIQD
237.0000 mL | Freq: Two times a day (BID) | ORAL | Status: DC
  Administered 2015-11-15 – 2015-11-24 (×16): 237 mL via ORAL

## 2015-11-15 MED ORDER — ONDANSETRON HCL 4 MG/2ML IJ SOLN
INTRAMUSCULAR | Status: AC
  Administered 2015-11-15: 09:00:00
  Filled 2015-11-15: qty 2

## 2015-11-15 MED ORDER — HEPARIN SODIUM (PORCINE) 5000 UNIT/ML IJ SOLN
5000.0000 [IU] | Freq: Three times a day (TID) | INTRAMUSCULAR | Status: DC
  Administered 2015-11-15 – 2015-11-24 (×23): 5000 [IU] via SUBCUTANEOUS
  Filled 2015-11-15 (×21): qty 1

## 2015-11-15 MED ORDER — SODIUM POLYSTYRENE SULFONATE 15 GM/60ML PO SUSP
30.0000 g | Freq: Once | ORAL | Status: AC
  Administered 2015-11-15: 30 g via RECTAL
  Filled 2015-11-15: qty 120

## 2015-11-15 MED ORDER — ONDANSETRON HCL 4 MG/2ML IJ SOLN
4.0000 mg | Freq: Four times a day (QID) | INTRAMUSCULAR | Status: DC | PRN
  Administered 2015-11-21 – 2015-11-23 (×2): 4 mg via INTRAVENOUS
  Filled 2015-11-15 (×3): qty 2

## 2015-11-15 MED ORDER — ENSURE ENLIVE PO LIQD
237.0000 mL | Freq: Three times a day (TID) | ORAL | Status: DC
  Administered 2015-11-15 – 2015-11-16 (×3): 237 mL via ORAL

## 2015-11-15 MED ORDER — METOCLOPRAMIDE HCL 5 MG/ML IJ SOLN
5.0000 mg | Freq: Three times a day (TID) | INTRAMUSCULAR | Status: DC
  Administered 2015-11-16 (×2): 5 mg via INTRAVENOUS
  Filled 2015-11-15 (×3): qty 2

## 2015-11-15 NOTE — Progress Notes (Signed)
CSW reviewed patient's chart and is aware that PT and OT has recommended SNF for patient. During progression today RN reports that there is a Palliative Care consult pending and MD Kolluru is following patient to determine if she'll need dialysis at discharge. CSW spoke to RN Case Manager and she reports that patient's son Delfino Lovett [phone]) is willing to assist with patient's care. CSW will continue to follow and assist.   Ernest Pine, MSW, [facility] Work Department [phone]

## 2015-11-15 NOTE — Progress Notes (Signed)
[facility] [place] NAME: Lisa Lowery    MR#:  [id number]  DATE OF BIRTH:  09/26/1940  SUBJECTIVE:  CHIEF COMPLAINT:   Chief Complaint  Patient presents with  . Leg Swelling   admitted for shortness of breath and lower extremity swelling and found to be in atrial fibrillation with rapid ventricular rate. Left thoracentesis done and 1.7 L fluid removed on 11/11/2015.  Patient is not feeling well. Complains of some right lower chest pain. Shortness of breath present. Nausea and vomiting today. Poor appetite. Oliguric. On 2 L oxygen.  REVIEW OF SYSTEMS:    Review of Systems  Constitutional: Positive for malaise/fatigue. Negative for fever and chills.  HENT: Negative for sore throat.   Eyes: Negative for blurred vision, double vision and pain.  Respiratory: Positive for cough and shortness of breath. Negative for hemoptysis and wheezing.   Cardiovascular: Positive for orthopnea and leg swelling. Negative for chest pain and palpitations.  Gastrointestinal: Negative for heartburn, nausea, vomiting, abdominal pain, diarrhea and constipation.  Genitourinary: Negative for dysuria and hematuria.  Musculoskeletal: Negative for back pain and joint pain.  Skin: Negative for rash.  Neurological: Positive for dizziness and weakness. Negative for sensory change, speech change, focal weakness and headaches.  Endo/Heme/Allergies: Does not bruise/bleed easily.  Psychiatric/Behavioral: Negative for depression. The patient is not nervous/anxious.     DRUG ALLERGIES:  No Known Allergies  VITALS:  Blood pressure 129/72, pulse 93, temperature 97.6 F (36.4 C), temperature source Oral, resp. rate 18, height 5\' 7"  (1.702 m), weight 101.47 kg (223 lb 11.2 oz), SpO2 94 %.  PHYSICAL EXAMINATION:   Physical Exam  GENERAL:  75 y.o.-year-old patient lying in the bed with conversational dyspnea. Anasarca. EYES: Pupils equal, round, reactive to  light and accommodation. No scleral icterus. Extraocular muscles intact.  HEENT: Head atraumatic, normocephalic. Oropharynx and nasopharynx clear.  NECK:  Supple, no jugular venous distention. No thyroid enlargement, no tenderness.  LUNGS: Bilateral crackles. Decreased air entry at the bases. CARDIOVASCULAR: S1, S2 normal. No murmurs, rubs, or gallops. Irregular ABDOMEN: Soft, nontender, nondistended. Bowel sounds present. No organomegaly or mass.  EXTREMITIES: No cyanosis, clubbing. 2+ lower extremity edema NEUROLOGIC: Cranial nerves II through XII are intact. No focal Motor or sensory deficits b/l.   PSYCHIATRIC: The patient is alert and oriented x 3. Flat affect. SKIN: No obvious rash, lesion, or ulcer.   LABORATORY PANEL:   CBC  Recent Labs Lab 11/10/15 2014  WBC 7.6  HGB 12.7  HCT 38.3  PLT 192   ------------------------------------------------------------------------------------------------------------------ Chemistries   Recent Labs Lab 11/15/15 0420  NA 134*  K 5.3*  CL 107  CO2 21*  GLUCOSE 117*  BUN 67*  CREATININE 4.83*  CALCIUM 7.8*  AST 46*  ALT 38  ALKPHOS 64  BILITOT 0.5   ------------------------------------------------------------------------------------------------------------------  Cardiac Enzymes  Recent Labs Lab 11/11/15 1226  TROPONINI 0.08*   ------------------------------------------------------------------------------------------------------------------  RADIOLOGY:  Dg Chest 2 View  11/14/2015  CLINICAL DATA:  BILATERAL leg swelling and shortness of breath, weakness, cough, hypertension, diabetes mellitus, atrial fibrillation EXAM: CHEST  2 VIEW COMPARISON:  11/11/2015 FINDINGS: Enlargement of cardiac silhouette with pulmonary vascular congestion. Atherosclerotic calcification aorta. Perihilar infiltrates likely representing pulmonary edema. Bibasilar effusions now atelectasis increased since previous exam. No pneumothorax or acute  osseous findings. IMPRESSION: CHF with increased bibasilar effusions and atelectasis since 11/11/2015. Electronically Signed   By: Lavonia Dana M.D.   On: 11/14/2015 14:03  ASSESSMENT AND PLAN:  75 year old female patient with history of diastolic CHF, chronic troponin elevation, atrial fibrillation and noncompliance presents to the hospital with shortness of breath and lower extremity swelling.  * Nausea and vomiting with right upper quadrant and right lower chest pain Check abdominal x-ray and right upper quadrant ultrasound. Need to look for cirrhosis due to severe hypoalbuminemia. Also rule out ileus  * Acute renal failure due to likely cardiorenal syndrome - worsening Worsening creatinine. Lasix stopped due to worsening renal function. Patient is not keen on getting started on dialysis. Discussed with Dr. Candiss Norse of nephrology.  * Bilateral pleural effusions left greater than right due to congestive heart failure and hypoalbuminemia Status post left thoracentesis with 1.7 L.  Transudate.  * Acute on chronic diastolic congestive heart failure with acute respiratory failure - worsening - Stopped Lasix due to worsening renal function - Beta blockers - Input and Output - Counseled to limit fluids and Salt - Monitor Bun/Cr and Potassium - Repeat chest x-ray done yesterday shows worsening pleural effusions  * A. Fib with RVR Rate controlled with metoprolol. Cardizem discontinued due to hypotension  * UTI On IV ceftriaxone  * Hypertension On metoprolol  * Diabetes mellitus Sliding scale insulin  * Chronic mild elevation of troponin is stable  All the records are reviewed and case discussed with Care Management/Social Workerr. Management plans discussed with the patient, family and they are in agreement.  Discussed regarding CODE STATUS. Patient states that she needs more time to think about it. We'll request a palliative care consult. As patient is worsening and will  likely need dialysis.  CODE STATUS: FULL CODE  DVT Prophylaxis: SCDs  TOTAL TIME TAKING CARE OF THIS PATIENT: 35 minutes.   POSSIBLE D/C IN 2-3 DAYS, DEPENDING ON CLINICAL CONDITION.  Hillary Bow R M.D on 11/15/2015 at 9:57 AM  Between 7am to 6pm - Pager - [phone]  After 6pm go to [email or web address] - password [email or web address] [facility]  [facility]  Office  [phone]  CC: Primary care physician; No PCP Per Patient  Note: This dictation was prepared with Dragon dictation along with smaller phrase technology. Any transcriptional errors that result from this process are unintentional.

## 2015-11-15 NOTE — Care Management (Signed)
Received call from Delfino Lovett - patient's son stating that he took off work today because there was a life and death situation with his mother.  Discussed that this CM spoke with him earlier and there was no mention of life and death.  Discussed that patient has some serious medical issues that must be discussed to direct the course of care.  Had attending paged and asked to call Richard asap. Updated attending

## 2015-11-15 NOTE — Care Management (Signed)
Spoke with patient regarding including son Delfino Lovett in on plan of care updates and patient is agreeable.  She does say "he can not take calls while he is at work."    Asked patient about her understanding of her medical condition.    She repeats "I don't know."  Asked about her understanding of renal status and she replies " I think they aren't doing real good."   She restates that it is okay to speak with Delfino Lovett but also verbalizes concern that he can not accept calls while at work.  Richard returned CM when message was left within a reasonable time frame.

## 2015-11-15 NOTE — Progress Notes (Signed)
Poor PO intake. Foley. 3 L ofoxygen. A fib. FS are stable. Pt reports no pain. Up to [facility] and tolerated it well. Son Lisa Lowery was contacted about making medical decisions. Pt slept most of the shift. Pt has no further concerns at this time.

## 2015-11-15 NOTE — Progress Notes (Signed)
MD sudini was asked to please call son Delfino Lovett asap

## 2015-11-15 NOTE — Care Management (Signed)
During progression, it was discussed that there has been no family calling to check on or  visiting patient.  There are concerns that Lisa Lowery is not able to make informed decisions regarding her medical care.  CM called son Lisa Lowery who lives in [place]  and was not aware his mother was in the hospital.   He does not wish to be updated on her condition and declines several times to participate in medical decisions for his mother.  He says he lives in [place] and unable to participate.  Discussed he could be available by pone and he said "no."   He said for CM to call social services for guardianship if CM could not find anyone to assist with medical decisions.   Was able to speak with son  Lisa Lowery [phone]).  He acknowledges he has not visited but says "I have tried to call" but no one answers.   Phone number he has been calling is [phone]- which is the phone number into patient's room.    Provided Lisa Lowery with unit phone number.  Discussed that patient's prognosis is concerning and needed to make some decisions regarding treatment plan and goals.    Lisa Lowery is agreeable to  participate in medical decisions.  There is another brother that is "useless and has had no contact with his mother."  Paged attending to provide him with update on family members.  Patient's renal status is declining, Lisa Lowery is a full code.  Palliative care consult to discuss hemodialysis

## 2015-11-15 NOTE — Progress Notes (Signed)
Physical Therapy Treatment Patient Details Name: Lisa Lowery MRN: [id number] DOB: 06/05/1941 Today's Date: 11/15/2015    History of Present Illness Patient is a 75 y/o female admitted with LE swelling, shortness of breath, a-fib with RVR. Noted to have demand ischemia. Had L thoracentesis on 3/30 to remove 1.7 L    PT Comments    Pt agreeable to PT, but bed exercises only at this time. Pt notes she was nauseated earlier today, but denies currently. Pt states she is trying to get her lunch down (pt has eaten very little). Pt complains of Bilateral lower extremities swelling and is noted to be edematous bilaterally. Pt participates well with supine bed exercises. Encouraged/educated on more supine lying with elevation to legs and ankle pumps several times a day. Pt's lower extremities elevated comfortably post exercises. Continue PT to progress endurance and strength to improve functional mobility. Encourage out of bed/ up in chair.   Follow Up Recommendations  SNF     Equipment Recommendations       Recommendations for Other Services       Precautions / Restrictions Precautions Precautions: Fall Restrictions Weight Bearing Restrictions: No    Mobility  Bed Mobility               General bed mobility comments: Refused at this time; pt notes she can sit edge of bed, but wishes not too now  Transfers                 General transfer comment: Refuse; notes she had been nauseated earlier and trying to work on lunch  Ambulation/Gait                 Stairs            Wheelchair Mobility    Modified Rankin (Stroke Patients Only)       Balance                                    Cognition Arousal/Alertness: Awake/alert Behavior During Therapy: WFL for tasks assessed/performed Overall Cognitive Status: Within Functional Limits for tasks assessed                      Exercises General Exercises - Lower Extremity Ankle  Circles/Pumps: AROM;Both;20 reps;Supine Quad Sets: Strengthening;Both;20 reps;Supine Gluteal Sets: Strengthening;Both;20 reps;Supine Short Arc Quad: AROM;Both;20 reps;Supine Long Arc Quad: Other (comment) (reviewed verbally) Heel Slides: AAROM;Both;20 reps;Supine Hip ABduction/ADduction: AAROM;Both;20 reps;Supine Straight Leg Raises: Other (comment) (attempted; feels strain in back, so deferred)    General Comments        Pertinent Vitals/Pain Pain Assessment: No/denies pain    Home Living                      Prior Function            PT Goals (current goals can now be found in the care plan section)      Frequency  Min 2X/week    PT Plan Current plan remains appropriate    Co-evaluation             End of Session   Activity Tolerance: Patient limited by fatigue;Patient tolerated treatment well Patient left: in bed;with call bell/phone within reach;with bed alarm set (BLEs elevated)     Time: [id number] PT Time Calculation (min) (ACUTE ONLY): 25 min  Charges:  $Therapeutic Exercise: 23-37 mins  G CodesCharlaine [facility], PTA 11/15/2015, 2:51 PM

## 2015-11-15 NOTE — Progress Notes (Signed)
[facility]  ROUNDING NOTE   Subjective:   Patient continues to have anasarca Urine output recorded at 250 cc only States that her breathing is okay Appetite is poor  Objective:  Vital signs in last 24 hours:  Temp:  [97.6 F (36.4 C)-98 F (36.7 C)] 98 F (36.7 C) (04/03 1125) Pulse Rate:  [76-93] 76 (04/03 1125) Resp:  [16-18] 18 (04/03 1125) BP: (114-129)/(47-72) 114/67 mmHg (04/03 1125) SpO2:  [94 %-99 %] 99 % (04/03 1125) Weight:  [101.47 kg (223 lb 11.2 oz)] 101.47 kg (223 lb 11.2 oz) (04/03 0438)  Weight change: -0.59 kg (-1 lb 4.8 oz) Filed Weights   11/13/15 0455 11/14/15 0353 11/15/15 0438  Weight: 99.745 kg (219 lb 14.4 oz) 102.059 kg (225 lb) 101.47 kg (223 lb 11.2 oz)    Intake/Output: I/O last 3 completed shifts: In: 6 [P.O.:240; I.V.:120; IV Piggyback:100] Out: 450 [Urine:450]   Intake/Output this shift:  Total I/O In: -  Out: 100 [Urine:100]  Physical Exam: General: NAD, chronically ill appearing, laying in bed  Head: Normocephalic, atraumatic. Moist oral mucosal membranes  Eyes: Anicteric,   Neck: Supple, trachea midline  Lungs:  Crackles bilaterally, [place] o2  Heart: Regular rate and rhythm  Abdomen:  Soft, nontender,   Extremities: ++ peripheral edema.  Neurologic: Nonfocal, moving all four extremities  Skin: No lesions       Basic Metabolic Panel:  Recent Labs Lab 11/10/15 2014 11/12/15 0407 11/13/15 0627 11/14/15 0510 11/15/15 0420  NA 137 138 135 134*  135 134*  K 4.9 5.0 5.5* 5.1  5.1 5.3*  CL 111 111 111 108  107 107  CO2 23 23 22  21*  21* 21*  GLUCOSE 144* 118* 117* 127*  129* 117*  BUN 40* 47* 53* 60*  60* 67*  CREATININE 2.33* 2.97* 3.64* 4.22*  4.02* 4.83*  CALCIUM 7.4* 7.1* 7.4* 7.9*  7.9* 7.8*  PHOS  --   --   --  5.1*  --     Liver Function Tests:  Recent Labs Lab 11/10/15 2014 11/12/15 0407 11/14/15 0510 11/15/15 0420  AST 64* 38  --  46*  ALT 38 29  --  38  ALKPHOS 72 53  --  64   BILITOT 0.6 0.6  --  0.5  PROT 7.8 6.4*  --  7.1  ALBUMIN 1.4* 1.2* 2.2* 1.9*   No results for input(s): LIPASE, AMYLASE in the last 168 hours. No results for input(s): AMMONIA in the last 168 hours.  CBC:  Recent Labs Lab 11/10/15 2014  WBC 7.6  NEUTROABS 5.3  HGB 12.7  HCT 38.3  MCV 89.2  PLT 192    Cardiac Enzymes:  Recent Labs Lab 11/10/15 2014 11/11/15 0017 11/11/15 0406 11/11/15 1226  TROPONINI 0.08* 0.08* 0.11* 0.08*    BNP: Invalid input(s): POCBNP  CBG:  Recent Labs Lab 11/14/15 1147 11/14/15 1627 11/14/15 2137 11/15/15 0734 11/15/15 1126  GLUCAP 110* 151* 93 116* 107*    Microbiology: Results for orders placed or performed during the hospital encounter of 11/10/15  Urine culture     Status: None   Collection Time: 11/11/15  3:44 AM  Result Value Ref Range Status   Specimen Description URINE, RANDOM  Final   Special Requests NONE  Final   Culture MULTIPLE SPECIES PRESENT, SUGGEST RECOLLECTION  Final   Report Status 11/13/2015 FINAL  Final  Culture, body fluid-bottle     Status: None (Preliminary result)   Collection Time: 11/11/15 11:45  AM  Result Value Ref Range Status   Specimen Description PLEURAL  Final   Special Requests NONE  Final   Gram Stain FEW WBC SEEN NO ORGANISMS SEEN   Final   Culture NO GROWTH 3 DAYS  Final   Report Status PENDING  Incomplete    Coagulation Studies: No results for input(s): LABPROT, INR in the last 72 hours.  Urinalysis: No results for input(s): COLORURINE, LABSPEC, PHURINE, GLUCOSEU, HGBUR, BILIRUBINUR, KETONESUR, PROTEINUR, UROBILINOGEN, NITRITE, LEUKOCYTESUR in the last 72 hours.  Invalid input(s): APPERANCEUR    Imaging: Dg Chest 2 View  11/14/2015  CLINICAL DATA:  BILATERAL leg swelling and shortness of breath, weakness, cough, hypertension, diabetes mellitus, atrial fibrillation EXAM: CHEST  2 VIEW COMPARISON:  11/11/2015 FINDINGS: Enlargement of cardiac silhouette with pulmonary vascular  congestion. Atherosclerotic calcification aorta. Perihilar infiltrates likely representing pulmonary edema. Bibasilar effusions now atelectasis increased since previous exam. No pneumothorax or acute osseous findings. IMPRESSION: CHF with increased bibasilar effusions and atelectasis since 11/11/2015. Electronically Signed   By: Lavonia Dana M.D.   On: 11/14/2015 14:03   Dg Abd 2 Views  11/15/2015  CLINICAL DATA:  Nausea and vomiting today. EXAM: ABDOMEN - 2 VIEW COMPARISON:  None. FINDINGS: Mildly prominent small bowel loops without definite obstruction. Stool and air is noted throughout the colon including the rectum. BILATERAL pleural effusions. Ascites not excluded. Degenerative change lumbar spine. IMPRESSION: No bowel obstruction is established. Mildly prominent small bowel loops may represent ileus. Stool and air are noted throughout the colon. Electronically Signed   By: Staci Righter M.D.   On: 11/15/2015 10:52     Medications:     . aspirin EC  81 mg Oral Daily  . feeding supplement (ENSURE ENLIVE)  237 mL Oral TID BM  . feeding supplement (NEPRO CARB STEADY)  237 mL Oral BID BM  . heparin subcutaneous  5,000 Units Subcutaneous 3 times per day  . insulin aspart  0-15 Units Subcutaneous TID WC  . metoCLOPramide (REGLAN) injection  5 mg Intravenous TID AC  . metoprolol tartrate  25 mg Oral BID  . sodium chloride flush  3 mL Intravenous Q12H   acetaminophen, ondansetron (ZOFRAN) IV  Assessment/ Plan:  [name] is a 75 y.o. white female with atrial fibrillation, diabetes mellitus type II, hypertension, vertigo, coronary artery disease, who was admitted to [facility] on 11/10/2015 with left pleural effusion. Status post left thoracentesis on 11/11/15  1. Acute renal failure on chronic kidney disease stage III with proteinuria: baseline creatinine 1.25 eGFR 41 from 09/11/15. Acute renal failure from acute cardiorenal syndrome. Recent cardiac procedure. Not on diuretics or low salt diet at  home. However suspect patient's creatinine fluctuates with volume status.  - S Cr and BUN are worse, volume status is not any better and patient now has high potassium  - discussed with patient that there is possibility of needing dialysis   2. Hypertension: with atrial fibrillation. Significant peripheral edema. Echo from 1/17 reviewed, diastolic dysfunction. With anasarca on examination - metoprolol for rate control.  - Holding furosemide and albumin.    3. Diabetes mellitus type II Insulin dependent with chronic kidney disease: hemoglobin A1c from 09/11/15 5.1% - Continue glucose control  4. Urinary Tract Infection: cultures with multiple organisms - ceftriaxone  5. Left pleural effusion: status post ultrasound guided thoracentesis on 3/30 with 1.7 litres removed.    LOS: 5 Kamali Nephew 4/3/20172:13 PM

## 2015-11-16 ENCOUNTER — Inpatient Hospital Stay: Payer: Medicare Other

## 2015-11-16 LAB — BASIC METABOLIC PANEL
Anion gap: 6 (ref 5–15)
BUN: 74 mg/dL — AB (ref 6–20)
CHLORIDE: 105 mmol/L (ref 101–111)
CO2: 23 mmol/L (ref 22–32)
Calcium: 8.1 mg/dL — ABNORMAL LOW (ref 8.9–10.3)
Creatinine, Ser: 5.43 mg/dL — ABNORMAL HIGH (ref 0.44–1.00)
GFR calc non Af Amer: 7 mL/min — ABNORMAL LOW (ref >60)
GFR, EST AFRICAN AMERICAN: 8 mL/min — AB (ref >60)
Glucose, Bld: 103 mg/dL — ABNORMAL HIGH (ref 65–99)
POTASSIUM: 5.8 mmol/L — AB (ref 3.5–5.1)
SODIUM: 134 mmol/L — AB (ref 135–145)

## 2015-11-16 LAB — CBC WITH DIFFERENTIAL/PLATELET
Basophils Absolute: 0.1 10*3/uL (ref 0–0.1)
Basophils Relative: 2 %
EOS ABS: 0.1 10*3/uL (ref 0–0.7)
Eosinophils Relative: 1 %
HEMATOCRIT: 36.7 % (ref 35.0–47.0)
HEMOGLOBIN: 12.1 g/dL (ref 12.0–16.0)
LYMPHS ABS: 0.8 10*3/uL — AB (ref 1.0–3.6)
LYMPHS PCT: 9 %
MCH: 29.8 pg (ref 26.0–34.0)
MCHC: 33.1 g/dL (ref 32.0–36.0)
MCV: 90 fL (ref 80.0–100.0)
MONOS PCT: 10 %
Monocytes Absolute: 0.9 10*3/uL (ref 0.2–0.9)
NEUTROS ABS: 7.2 10*3/uL — AB (ref 1.4–6.5)
NEUTROS PCT: 78 %
Platelets: 229 10*3/uL (ref 150–440)
RBC: 4.08 MIL/uL (ref 3.80–5.20)
RDW: 14.2 % (ref 11.5–14.5)
WBC: 9.2 10*3/uL (ref 3.6–11.0)

## 2015-11-16 LAB — CULTURE, BODY FLUID W GRAM STAIN -BOTTLE: Culture: NO GROWTH

## 2015-11-16 LAB — GLUCOSE, CAPILLARY
GLUCOSE-CAPILLARY: 86 mg/dL (ref 65–99)
GLUCOSE-CAPILLARY: 137 mg/dL — AB (ref 65–99)
Glucose-Capillary: 93 mg/dL (ref 65–99)

## 2015-11-16 LAB — CULTURE, BODY FLUID-BOTTLE

## 2015-11-16 MED ORDER — SENNOSIDES-DOCUSATE SODIUM 8.6-50 MG PO TABS
1.0000 | ORAL_TABLET | Freq: Two times a day (BID) | ORAL | Status: DC
  Administered 2015-11-16 – 2015-11-23 (×10): 1 via ORAL
  Filled 2015-11-16 (×12): qty 1

## 2015-11-16 MED ORDER — BISACODYL 5 MG PO TBEC
10.0000 mg | DELAYED_RELEASE_TABLET | Freq: Once | ORAL | Status: AC
  Administered 2015-11-16: 10 mg via ORAL
  Filled 2015-11-16: qty 2

## 2015-11-16 MED ORDER — POLYETHYLENE GLYCOL 3350 17 G PO PACK
17.0000 g | PACK | Freq: Every day | ORAL | Status: DC | PRN
Start: 2015-11-16 — End: 2015-11-24

## 2015-11-16 MED ORDER — SODIUM POLYSTYRENE SULFONATE 15 GM/60ML PO SUSP
30.0000 g | Freq: Once | ORAL | Status: AC
  Administered 2015-11-16: 30 g via ORAL
  Filled 2015-11-16: qty 120

## 2015-11-16 NOTE — Progress Notes (Signed)
[place] [facility] [place] NAME: Lisa Lowery    MR#:  [id number]  DATE OF BIRTH:  01/27/1941  SUBJECTIVE:  CHIEF COMPLAINT:   Chief Complaint  Patient presents with  . Leg Swelling   admitted for shortness of breath and lower extremity swelling and found to be in atrial fibrillation with rapid ventricular rate. Left thoracentesis done and 1.7 L fluid removed on 11/11/2015.  She is about the same. Weak. Once to go home. Poor appetite No further vomiting. No abdominal pain. Oliguric with 200 ML urine in the last 24 hours.  REVIEW OF SYSTEMS:    Review of Systems  Constitutional: Positive for malaise/fatigue. Negative for fever and chills.  HENT: Negative for sore throat.   Eyes: Negative for blurred vision, double vision and pain.  Respiratory: Positive for cough and shortness of breath. Negative for hemoptysis and wheezing.   Cardiovascular: Positive for orthopnea and leg swelling. Negative for chest pain and palpitations.  Gastrointestinal: Negative for heartburn, nausea, vomiting, abdominal pain, diarrhea and constipation.  Genitourinary: Negative for dysuria and hematuria.  Musculoskeletal: Negative for back pain and joint pain.  Skin: Negative for rash.  Neurological: Positive for dizziness and weakness. Negative for sensory change, speech change, focal weakness and headaches.  Endo/Heme/Allergies: Does not bruise/bleed easily.  Psychiatric/Behavioral: Negative for depression. The patient is not nervous/anxious.     DRUG ALLERGIES:  No Known Allergies  VITALS:  Blood pressure 123/62, pulse 82, temperature 97.4 F (36.3 C), temperature source Oral, resp. rate 18, height 5\' 7"  (1.702 m), weight 100.754 kg (222 lb 2 oz), SpO2 98 %.  PHYSICAL EXAMINATION:   Physical Exam  GENERAL:  75 y.o.-year-old patient lying in the bed. Anasarca. EYES: Pupils equal, round, reactive to light and accommodation. No scleral icterus.  Extraocular muscles intact.  HEENT: Head atraumatic, normocephalic. Oropharynx and nasopharynx clear.  NECK:  Supple, no jugular venous distention. No thyroid enlargement, no tenderness.  LUNGS: Bilateral crackles. Decreased air entry at the bases. CARDIOVASCULAR: S1, S2 normal. No murmurs, rubs, or gallops. Irregular ABDOMEN: Soft, nontender, nondistended. Bowel sounds present. No organomegaly or mass.  EXTREMITIES: No cyanosis, clubbing. 2+ lower extremity edema NEUROLOGIC: Cranial nerves II through XII are intact. No focal Motor or sensory deficits b/l.   PSYCHIATRIC: The patient is alert and oriented x 3. Flat affect. SKIN: No obvious rash, lesion, or ulcer.   LABORATORY PANEL:   CBC  Recent Labs Lab 11/16/15 0539  WBC 9.2  HGB 12.1  HCT 36.7  PLT 229   ------------------------------------------------------------------------------------------------------------------ Chemistries   Recent Labs Lab 11/15/15 0420 11/16/15 0539  NA 134* 134*  K 5.3* 5.8*  CL 107 105  CO2 21* 23  GLUCOSE 117* 103*  BUN 67* 74*  CREATININE 4.83* 5.43*  CALCIUM 7.8* 8.1*  AST 46*  --   ALT 38  --   ALKPHOS 64  --   BILITOT 0.5  --    ------------------------------------------------------------------------------------------------------------------  Cardiac Enzymes  Recent Labs Lab 11/11/15 1226  TROPONINI 0.08*   ------------------------------------------------------------------------------------------------------------------  RADIOLOGY:  Dg Chest 2 View  11/14/2015  CLINICAL DATA:  BILATERAL leg swelling and shortness of breath, weakness, cough, hypertension, diabetes mellitus, atrial fibrillation EXAM: CHEST  2 VIEW COMPARISON:  11/11/2015 FINDINGS: Enlargement of cardiac silhouette with pulmonary vascular congestion. Atherosclerotic calcification aorta. Perihilar infiltrates likely representing pulmonary edema. Bibasilar effusions now atelectasis increased since previous exam. No  pneumothorax or acute osseous findings. IMPRESSION: CHF with increased bibasilar effusions and atelectasis  since 11/11/2015. Electronically Signed   By: Lavonia Dana M.D.   On: 11/14/2015 14:03   Dg Abd 2 Views  11/15/2015  CLINICAL DATA:  Nausea and vomiting today. EXAM: ABDOMEN - 2 VIEW COMPARISON:  None. FINDINGS: Mildly prominent small bowel loops without definite obstruction. Stool and air is noted throughout the colon including the rectum. BILATERAL pleural effusions. Ascites not excluded. Degenerative change lumbar spine. IMPRESSION: No bowel obstruction is established. Mildly prominent small bowel loops may represent ileus. Stool and air are noted throughout the colon. Electronically Signed   By: Staci Righter M.D.   On: 11/15/2015 10:52     ASSESSMENT AND PLAN:  75 year old female patient with history of diastolic CHF, chronic troponin elevation, atrial fibrillation and noncompliance presents to the hospital with shortness of breath and lower extremity swelling.  * Ileus Treated with Dulcolax suppository. No further vomiting. Repeat abdominal x-ray. We'll add daily Colace and one dose of Dulcolax today.  * Acute renal failure due to likely cardiorenal syndrome - worsening Lasix stopped due to worsening renal function. Patient is not keen on getting started on dialysis.  Discussed with Dr. Candiss Norse of nephrology. Discussed at length with patient regarding need for dialysis. Explained the process of dialysis. Patient verbalized understanding but later has requested time to think about it. Dr. Candiss Norse followed this discussion patient is unable to make a decision at this point.  * Bilateral pleural effusions left greater than right due to congestive heart failure and hypoalbuminemia Status post left thoracentesis with 1.7 L.  Transudate.  * Acute on chronic diastolic congestive heart failure with acute respiratory failure - worsening - Stopped Lasix due to worsening renal function - Beta  blockers - Input and Output - Counseled to limit fluids and Salt - Monitor Bun/Cr and Potassium - Repeat chest x-ray done showed worsening pleural effusions - Will need dialysis  * A. Fib with RVR Rate controlled with metoprolol. Cardizem discontinued due to hypotension  * UTI On IV ceftriaxone  * Hypertension On metoprolol  * Diabetes mellitus Sliding scale insulin  * Chronic mild elevation of troponin is stable  All the records are reviewed and case discussed with Care Management/Social Workerr. Management plans discussed with the patient, family and they are in agreement.  Discussed at length with patient again regarding her CODE STATUS and need for dialysis. Patient is unable to make a decision at this time. Was to make all her decisions and and agreed that her son Delfino Lovett will make decisions if she is unable to in the future. We'll request palliative care to see the patient due to worsening multiple comorbidities. Goals of care.  CODE STATUS: FULL CODE  DVT Prophylaxis: SCDs  TOTAL TIME TAKING CARE OF THIS PATIENT: 40 minutes.  > 50% time spent in co-ordination of care and discussing with patient at bedside    Hillary Bow R M.D on 11/16/2015 at 11:17 AM  Between 7am to 6pm - Pager - [phone]  After 6pm go to [email or web address] - password [email or web address] [facility]  [facility]  Office  [phone]  CC: Primary care physician; No PCP Per Patient  Note: This dictation was prepared with Dragon dictation along with smaller phrase technology. Any transcriptional errors that result from this process are unintentional.

## 2015-11-16 NOTE — Progress Notes (Signed)
[facility]  ROUNDING NOTE   Subjective:   Patient continues to have anasarca Urine output recorded at 200 cc only (foley collection) States that her breathing is okay Appetite is poor Although patient states she is hungry. By nursing report, patient is refusing food Potassium level is significantly high today Serum creatinine and BUN levels have significantly worsened   Objective:  Vital signs in last 24 hours:  Temp:  [97.4 F (36.3 C)-97.6 F (36.4 C)] 97.4 F (36.3 C) (04/04 0401) Pulse Rate:  [82] 82 (04/04 0401) Resp:  [18] 18 (04/04 0401) BP: (123-139)/(62-64) 123/62 mmHg (04/04 0401) SpO2:  [94 %-98 %] 98 % (04/04 0401) Weight:  [100.754 kg (222 lb 2 oz)] 100.754 kg (222 lb 2 oz) (04/04 0401)  Weight change: -0.716 kg (-1 lb 9.2 oz) Filed Weights   11/14/15 0353 11/15/15 0438 11/16/15 0401  Weight: 102.059 kg (225 lb) 101.47 kg (223 lb 11.2 oz) 100.754 kg (222 lb 2 oz)    Intake/Output: I/O last 3 completed shifts: In: -  Out: 200 [Urine:200]   Intake/Output this shift:     Physical Exam: General: NAD, chronically ill appearing, laying in bed  Head: Normocephalic, atraumatic. Dry oral mucosal membranes  Eyes: Anicteric,   Neck: Supple, trachea midline  Lungs:  Crackles bilaterally, [place] o2  Heart: Regular rate and rhythm  Abdomen:  Soft, nontender,   Extremities: ++ peripheral edema all 4 extremities.  Neurologic: Nonfocal, moving all four extremities, alert, following commands  Skin: No acute lesions       Basic Metabolic Panel:  Recent Labs Lab 11/12/15 0407 11/13/15 0627 11/14/15 0510 11/15/15 0420 11/16/15 0539  NA 138 135 134*  135 134* 134*  K 5.0 5.5* 5.1  5.1 5.3* 5.8*  CL 111 111 108  107 107 105  CO2 23 22 21*  21* 21* 23  GLUCOSE 118* 117* 127*  129* 117* 103*  BUN 47* 53* 60*  60* 67* 74*  CREATININE 2.97* 3.64* 4.22*  4.02* 4.83* 5.43*  CALCIUM 7.1* 7.4* 7.9*  7.9* 7.8* 8.1*  PHOS  --   --  5.1*  --   --      Liver Function Tests:  Recent Labs Lab 11/10/15 2014 11/12/15 0407 11/14/15 0510 11/15/15 0420  AST 64* 38  --  46*  ALT 38 29  --  38  ALKPHOS 72 53  --  64  BILITOT 0.6 0.6  --  0.5  PROT 7.8 6.4*  --  7.1  ALBUMIN 1.4* 1.2* 2.2* 1.9*   No results for input(s): LIPASE, AMYLASE in the last 168 hours. No results for input(s): AMMONIA in the last 168 hours.  CBC:  Recent Labs Lab 11/10/15 2014 11/16/15 0539  WBC 7.6 9.2  NEUTROABS 5.3 7.2*  HGB 12.7 12.1  HCT 38.3 36.7  MCV 89.2 90.0  PLT 192 229    Cardiac Enzymes:  Recent Labs Lab 11/10/15 2014 11/11/15 0017 11/11/15 0406 11/11/15 1226  TROPONINI 0.08* 0.08* 0.11* 0.08*    BNP: Invalid input(s): POCBNP  CBG:  Recent Labs Lab 11/14/15 2137 11/15/15 0734 11/15/15 1126 11/15/15 1621 11/15/15 2108  GLUCAP 93 116* 107* 140* 100*    Microbiology: Results for orders placed or performed during the hospital encounter of 11/10/15  Urine culture     Status: None   Collection Time: 11/11/15  3:44 AM  Result Value Ref Range Status   Specimen Description URINE, RANDOM  Final   Special Requests NONE  Final  Culture MULTIPLE SPECIES PRESENT, SUGGEST RECOLLECTION  Final   Report Status 11/13/2015 FINAL  Final  Culture, body fluid-bottle     Status: None   Collection Time: 11/11/15 11:45 AM  Result Value Ref Range Status   Specimen Description PLEURAL  Final   Special Requests NONE  Final   Gram Stain FEW WBC SEEN NO ORGANISMS SEEN   Final   Culture NO GROWTH 5 DAYS  Final   Report Status 11/16/2015 FINAL  Final    Coagulation Studies: No results for input(s): LABPROT, INR in the last 72 hours.  Urinalysis: No results for input(s): COLORURINE, LABSPEC, PHURINE, GLUCOSEU, HGBUR, BILIRUBINUR, KETONESUR, PROTEINUR, UROBILINOGEN, NITRITE, LEUKOCYTESUR in the last 72 hours.  Invalid input(s): APPERANCEUR    Imaging: Dg Chest 2 View  11/14/2015  CLINICAL DATA:  BILATERAL leg swelling and  shortness of breath, weakness, cough, hypertension, diabetes mellitus, atrial fibrillation EXAM: CHEST  2 VIEW COMPARISON:  11/11/2015 FINDINGS: Enlargement of cardiac silhouette with pulmonary vascular congestion. Atherosclerotic calcification aorta. Perihilar infiltrates likely representing pulmonary edema. Bibasilar effusions now atelectasis increased since previous exam. No pneumothorax or acute osseous findings. IMPRESSION: CHF with increased bibasilar effusions and atelectasis since 11/11/2015. Electronically Signed   By: Lavonia Dana M.D.   On: 11/14/2015 14:03   Dg Abd 2 Views  11/15/2015  CLINICAL DATA:  Nausea and vomiting today. EXAM: ABDOMEN - 2 VIEW COMPARISON:  None. FINDINGS: Mildly prominent small bowel loops without definite obstruction. Stool and air is noted throughout the colon including the rectum. BILATERAL pleural effusions. Ascites not excluded. Degenerative change lumbar spine. IMPRESSION: No bowel obstruction is established. Mildly prominent small bowel loops may represent ileus. Stool and air are noted throughout the colon. Electronically Signed   By: Staci Righter M.D.   On: 11/15/2015 10:52   US Abdomen Limited Ruq  11/16/2015  CLINICAL DATA:  75 year old female with vomiting for 1 week. Initial encounter. EXAM: US ABDOMEN LIMITED - RIGHT UPPER QUADRANT COMPARISON:  Renal ultrasound 11/11/2015 FINDINGS: Gallbladder: Shadowing up to 3.1 cm echogenic focus in the lumen is mobile compatible with a large stone. Gallbladder wall thickness remains normal at 2-3 mm. No pericholecystic fluid. No sonographic Murphy sign elicited. Common bile duct: Diameter: 4 mm, normal Liver: Trace perihepatic ascites. No discrete liver lesion or intrahepatic biliary ductal dilatation. Liver echogenicity within normal limits. Other findings: Right pleural effusion (image 37). IMPRESSION: 1. Gallstone measuring up to 3.1 cm within the gallbladder, but no evidence of acute cholecystitis or biliary obstruction.  2. Trace perihepatic ascites. 3. Right pleural effusion. Electronically Signed   By: Genevie Ann M.D.   On: 11/16/2015 11:45     Medications:     . aspirin EC  81 mg Oral Daily  . bisacodyl  10 mg Oral Once  . feeding supplement (ENSURE ENLIVE)  237 mL Oral TID BM  . feeding supplement (NEPRO CARB STEADY)  237 mL Oral BID BM  . heparin subcutaneous  5,000 Units Subcutaneous 3 times per day  . insulin aspart  0-15 Units Subcutaneous TID WC  . metoCLOPramide (REGLAN) injection  5 mg Intravenous TID AC  . metoprolol tartrate  25 mg Oral BID  . multivitamin with minerals  1 tablet Oral Daily  . senna-docusate  1 tablet Oral BID  . sodium chloride flush  3 mL Intravenous Q12H   acetaminophen, ondansetron (ZOFRAN) IV, polyethylene glycol  Assessment/ Plan:  [name] is a 75 y.o. white female with atrial fibrillation, diabetes mellitus type II,  hypertension, vertigo, coronary artery disease, who was admitted to [facility] on 11/10/2015 with left pleural effusion. Status post left thoracentesis on 11/11/15  1. Acute renal failure on chronic kidney disease stage III with proteinuria: baseline creatinine 1.25 eGFR 41 from 09/11/15. Acute renal failure from acute cardiorenal syndrome. Recent thoracentesis (left) on 3/30 where 1.7 L of fluid was removed. Patient with multiple episodes of hypotension on 3/31, likely resulting in ATN  - S Cr and BUN are worse, volume status is not any better and potassium is starting to get critically high - discussed with patient that we should start dialysis. We are hoping that it is temporary. - Rationale for need of dialysis was explained to patient in really simple terms. Patient states that she understands and agrees that it is reasonable what we are offering but is not convinced that she wants to take dialysis just yet. Discussed that abnormal electrolytes could be dangerous to her heart rhythm and cause harm to her body including cardiac arrest and death. Offered  to talk with family to help her make decision but patient declined.   Agree with palliative care evaluation to clarify goals of care    2. Hypertension: with atrial fibrillation.  Echo from 1/17 reviewed, diastolic dysfunction. With anasarca on examination - metoprolol for rate control.  - Holding furosemide and albumin.    3. Diabetes mellitus type II Insulin dependent with chronic kidney disease: hemoglobin A1c from 09/11/15 5.1% - Continue glucose control  4. Hyperkalemia - Expected to improve with dialysis but patient is declining therapy  -   5. Generalized edema and Left pleural effusion: status post ultrasound guided thoracentesis on 3/30 with 1.7 litres removed.  - see above regarding discussion about dialysis  Case discussed with Dr Darvin Neighbours   LOS: 6 [facility] 4/4/201712:02 PM

## 2015-11-17 LAB — COMPREHENSIVE METABOLIC PANEL
ALBUMIN: 1.7 g/dL — AB (ref 3.5–5.0)
ALT: 32 U/L (ref 14–54)
AST: 35 U/L (ref 15–41)
Alkaline Phosphatase: 65 U/L (ref 38–126)
Anion gap: 5 (ref 5–15)
BILIRUBIN TOTAL: 0.3 mg/dL (ref 0.3–1.2)
BUN: 82 mg/dL — AB (ref 6–20)
CHLORIDE: 108 mmol/L (ref 101–111)
CO2: 23 mmol/L (ref 22–32)
Calcium: 7.8 mg/dL — ABNORMAL LOW (ref 8.9–10.3)
Creatinine, Ser: 6.14 mg/dL — ABNORMAL HIGH (ref 0.44–1.00)
GFR calc Af Amer: 7 mL/min — ABNORMAL LOW (ref >60)
GFR calc non Af Amer: 6 mL/min — ABNORMAL LOW (ref >60)
GLUCOSE: 122 mg/dL — AB (ref 65–99)
POTASSIUM: 5.8 mmol/L — AB (ref 3.5–5.1)
SODIUM: 136 mmol/L (ref 135–145)
Total Protein: 7.1 g/dL (ref 6.5–8.1)

## 2015-11-17 LAB — GLUCOSE, CAPILLARY
GLUCOSE-CAPILLARY: 109 mg/dL — AB (ref 65–99)
GLUCOSE-CAPILLARY: 122 mg/dL — AB (ref 65–99)
GLUCOSE-CAPILLARY: 122 mg/dL — AB (ref 65–99)
Glucose-Capillary: 87 mg/dL (ref 65–99)

## 2015-11-17 MED ORDER — SODIUM POLYSTYRENE SULFONATE 15 GM/60ML PO SUSP: 60.0000 g | Freq: Once | ORAL | Status: DC

## 2015-11-17 MED ORDER — PATIROMER SORBITEX CALCIUM 8.4 G PO PACK
8.4000 g | PACK | Freq: Every day | ORAL | Status: DC
  Administered 2015-11-17 – 2015-11-18 (×2): 8.4 g via ORAL
  Filled 2015-11-17 (×3): qty 4

## 2015-11-17 MED ORDER — SODIUM POLYSTYRENE SULFONATE 15 GM/60ML PO SUSP
30.0000 g | Freq: Once | ORAL | Status: AC
  Administered 2015-11-17: 30 g via ORAL
  Filled 2015-11-17: qty 120

## 2015-11-17 MED ORDER — SODIUM POLYSTYRENE SULFONATE 15 GM/60ML PO SUSP: 15.0000 g | Freq: Once | ORAL | Status: DC

## 2015-11-17 MED ORDER — SODIUM POLYSTYRENE SULFONATE 15 GM/60ML PO SUSP
15.0000 g | Freq: Once | ORAL | Status: AC
  Administered 2015-11-17: 15 g via ORAL
  Filled 2015-11-17: qty 60

## 2015-11-17 NOTE — Progress Notes (Signed)
[facility]  ROUNDING NOTE   Subjective:   Patient continues to have anasarca Urine output recorded at 180 cc only (foley collection) States that her breathing is okay Appetite is poor   Potassium level is significantly high today; received SPS Serum creatinine and BUN levels continue to worsen   Objective:  Vital signs in last 24 hours:  Temp:  [97.3 F (36.3 C)-97.7 F (36.5 C)] 97.3 F (36.3 C) (04/05 0513) Pulse Rate:  [80-96] 91 (04/05 0745) Resp:  [16-18] 18 (04/05 0745) BP: (108-114)/(60-65) 114/64 mmHg (04/05 0745) SpO2:  [96 %-100 %] 96 % (04/05 0745) Weight:  [102.8 kg (226 lb 10.1 oz)] 102.8 kg (226 lb 10.1 oz) (04/05 0513)  Weight change: 2.046 kg (4 lb 8.2 oz) Filed Weights   11/15/15 0438 11/16/15 0401 11/17/15 0513  Weight: 101.47 kg (223 lb 11.2 oz) 100.754 kg (222 lb 2 oz) 102.8 kg (226 lb 10.1 oz)    Intake/Output: I/O last 3 completed shifts: In: 240 [P.O.:240] Out: 230 [Urine:230]   Intake/Output this shift:     Physical Exam: General: NAD, chronically ill appearing, laying in bed  Head: Normocephalic, atraumatic. Dry oral mucosal membranes  Eyes: Anicteric,   Neck: Supple, trachea midline  Lungs:  Clear ant/lat, [place] o2  Heart: Regular rate and rhythm  Abdomen:  Soft, nontender,   Extremities: ++ peripheral edema all 4 extremities.  Neurologic: Nonfocal, moving all four extremities, alert, following commands  Skin: No acute lesions       Basic Metabolic Panel:  Recent Labs Lab 11/13/15 0627 11/14/15 0510 11/15/15 0420 11/16/15 0539 11/17/15 0415  NA 135 134*  135 134* 134* 136  K 5.5* 5.1  5.1 5.3* 5.8* 5.8*  CL 111 108  107 107 105 108  CO2 22 21*  21* 21* 23 23  GLUCOSE 117* 127*  129* 117* 103* 122*  BUN 53* 60*  60* 67* 74* 82*  CREATININE 3.64* 4.22*  4.02* 4.83* 5.43* 6.14*  CALCIUM 7.4* 7.9*  7.9* 7.8* 8.1* 7.8*  PHOS  --  5.1*  --   --   --     Liver Function Tests:  Recent Labs Lab  11/10/15 2014 11/12/15 0407 11/14/15 0510 11/15/15 0420 11/17/15 0415  AST 64* 38  --  46* 35  ALT 38 29  --  38 32  ALKPHOS 72 53  --  64 65  BILITOT 0.6 0.6  --  0.5 0.3  PROT 7.8 6.4*  --  7.1 7.1  ALBUMIN 1.4* 1.2* 2.2* 1.9* 1.7*   No results for input(s): LIPASE, AMYLASE in the last 168 hours. No results for input(s): AMMONIA in the last 168 hours.  CBC:  Recent Labs Lab 11/10/15 2014 11/16/15 0539  WBC 7.6 9.2  NEUTROABS 5.3 7.2*  HGB 12.7 12.1  HCT 38.3 36.7  MCV 89.2 90.0  PLT 192 229    Cardiac Enzymes:  Recent Labs Lab 11/10/15 2014 11/11/15 0017 11/11/15 0406 11/11/15 1226  TROPONINI 0.08* 0.08* 0.11* 0.08*    BNP: Invalid input(s): POCBNP  CBG:  Recent Labs Lab 11/16/15 0812 11/16/15 1240 11/16/15 1631 11/16/15 2149 11/17/15 0743  GLUCAP 87 86 137* 93 109*    Microbiology: Results for orders placed or performed during the hospital encounter of 11/10/15  Urine culture     Status: None   Collection Time: 11/11/15  3:44 AM  Result Value Ref Range Status   Specimen Description URINE, RANDOM  Final   Special Requests NONE  Final   Culture MULTIPLE SPECIES PRESENT, SUGGEST RECOLLECTION  Final   Report Status 11/13/2015 FINAL  Final  Culture, body fluid-bottle     Status: None   Collection Time: 11/11/15 11:45 AM  Result Value Ref Range Status   Specimen Description PLEURAL  Final   Special Requests NONE  Final   Gram Stain FEW WBC SEEN NO ORGANISMS SEEN   Final   Culture NO GROWTH 5 DAYS  Final   Report Status 11/16/2015 FINAL  Final    Coagulation Studies: No results for input(s): LABPROT, INR in the last 72 hours.  Urinalysis: No results for input(s): COLORURINE, LABSPEC, PHURINE, GLUCOSEU, HGBUR, BILIRUBINUR, KETONESUR, PROTEINUR, UROBILINOGEN, NITRITE, LEUKOCYTESUR in the last 72 hours.  Invalid input(s): APPERANCEUR    Imaging: Dg Abd 1 View  11/16/2015  CLINICAL DATA:  Ileus EXAM: ABDOMEN - 1 VIEW COMPARISON:   11/15/2015 FINDINGS: Scattered large and small bowel gas is noted. The degree of air-filled small bowel loops has decreased in the interval from the prior exam. Fecal material is noted within the colon. No acute bony abnormality is seen. IMPRESSION: No acute abnormality noted. Electronically Signed   By: Inez Catalina M.D.   On: 11/16/2015 13:11   Dg Abd 2 Views  11/15/2015  CLINICAL DATA:  Nausea and vomiting today. EXAM: ABDOMEN - 2 VIEW COMPARISON:  None. FINDINGS: Mildly prominent small bowel loops without definite obstruction. Stool and air is noted throughout the colon including the rectum. BILATERAL pleural effusions. Ascites not excluded. Degenerative change lumbar spine. IMPRESSION: No bowel obstruction is established. Mildly prominent small bowel loops may represent ileus. Stool and air are noted throughout the colon. Electronically Signed   By: Staci Righter M.D.   On: 11/15/2015 10:52   US Abdomen Limited Ruq  11/16/2015  CLINICAL DATA:  75 year old female with vomiting for 1 week. Initial encounter. EXAM: US ABDOMEN LIMITED - RIGHT UPPER QUADRANT COMPARISON:  Renal ultrasound 11/11/2015 FINDINGS: Gallbladder: Shadowing up to 3.1 cm echogenic focus in the lumen is mobile compatible with a large stone. Gallbladder wall thickness remains normal at 2-3 mm. No pericholecystic fluid. No sonographic Murphy sign elicited. Common bile duct: Diameter: 4 mm, normal Liver: Trace perihepatic ascites. No discrete liver lesion or intrahepatic biliary ductal dilatation. Liver echogenicity within normal limits. Other findings: Right pleural effusion (image 37). IMPRESSION: 1. Gallstone measuring up to 3.1 cm within the gallbladder, but no evidence of acute cholecystitis or biliary obstruction. 2. Trace perihepatic ascites. 3. Right pleural effusion. Electronically Signed   By: Genevie Ann M.D.   On: 11/16/2015 11:45     Medications:     . aspirin EC  81 mg Oral Daily  . feeding supplement (NEPRO CARB STEADY)  237  mL Oral BID BM  . heparin subcutaneous  5,000 Units Subcutaneous 3 times per day  . metoprolol tartrate  25 mg Oral BID  . multivitamin with minerals  1 tablet Oral Daily  . senna-docusate  1 tablet Oral BID  . sodium chloride flush  3 mL Intravenous Q12H   acetaminophen, ondansetron (ZOFRAN) IV, polyethylene glycol  Assessment/ Plan:  Ms. Mariyam Remington is a 75 y.o. white female with atrial fibrillation, diabetes mellitus type II, hypertension, vertigo, coronary artery disease, who was admitted to [facility] on 11/10/2015 with left pleural effusion. Status post left thoracentesis on 11/11/15  1. Acute renal failure on chronic kidney disease stage III with proteinuria: baseline creatinine 1.25 eGFR 41 from 09/11/15. Acute renal failure from acute cardiorenal syndrome. Recent  thoracentesis (left) on 3/30 where 1.7 L of fluid was removed. Patient with multiple episodes of hypotension on 3/31, likely resulting in ATN  - With worsening S Cr and BUN, significant edema, recommend Dialysis to patient - Patient states she has not talked with her family yet re: dialysis. Not willing to proceed at present - Agree with palliative care evaluation to clarify goals of care    2. Hypertension: with atrial fibrillation.  Echo from 1/17 reviewed, diastolic dysfunction. With anasarca on examination - metoprolol for rate control.  - Holding furosemide and albumin.    3. Diabetes mellitus type II Insulin dependent with chronic kidney disease: hemoglobin A1c from 09/11/15 5.1% - Continue glucose control  4. Hyperkalemia - Expected to improve with dialysis but patient is declining therapy  - SPS - start [place] for chronic potassium control  5. Generalized edema and Left pleural effusion: status post ultrasound guided thoracentesis on 3/30 with 1.7 litres removed.  - see above regarding discussion about dialysis      LOS: 7 Lachlan Pelto 4/5/201710:26 AM

## 2015-11-17 NOTE — NC FL2 (Signed)
[place] LEVEL OF CARE SCREENING TOOL     IDENTIFICATION  Patient Name: Lisa Lowery Birthdate: Dec 13, 1940 Sex: female Admission Date (Current Location): 11/10/2015  [place] and [place] Number:  Engineering geologist and Address:  [facility], [address], [place], [place] [address]      Provider Number: [id number]  Attending Physician Name and Address:  Hillary Bow, MD  Relative Name and Phone Number:       Current Level of Care: Hospital Recommended Level of Care: [place] Prior Approval Number:    Date Approved/Denied:   PASRR Number:  ([id number])  Discharge Plan: SNF    Current Diagnoses: Patient Active Problem List   Diagnosis Date Noted  . Pleural effusion, left 11/10/2015  . General weakness 11/10/2015  . DM2 (diabetes mellitus, type 2) ([place]) 11/10/2015  . Atrial fibrillation with RVR ([place]) 09/11/2015  . Essential hypertension, malignant 02/23/2015  . Renal insufficiency 02/23/2015  . DM (diabetes mellitus) type 2, uncontrolled, with ketoacidosis ([place]) 02/23/2015  . Dizziness 02/21/2015  . Elevated troponin 02/21/2015  . HTN (hypertension) 02/21/2015  . Elevated blood sugar 02/21/2015    Orientation RESPIRATION BLADDER Height & Weight     Time, Self, Situation, Place  O2 (Nasal Cannula 1 (L/min) ) Continent Weight: 226 lb 10.1 oz (102.8 kg) Height:  5\' 7"  (170.2 cm)  BEHAVIORAL SYMPTOMS/MOOD NEUROLOGICAL BOWEL NUTRITION STATUS   (None)  (None) Incontinent Diet (heart healthy/carb modified )  AMBULATORY STATUS COMMUNICATION OF NEEDS Skin   Extensive Assist Verbally Normal                       Personal Care Assistance Level of Assistance  Bathing, Feeding, Dressing   Feeding assistance: Independent Dressing Assistance: Limited assistance     Functional Limitations Info  Sight, Hearing, Speech Sight Info: Adequate Hearing Info: Adequate Speech Info: Adequate    SPECIAL CARE  FACTORS FREQUENCY  PT (By licensed PT)     PT Frequency:  (5)              Contractures      Additional Factors Info  Code Status, Allergies Code Status Info:  (Full Code) Allergies Info:  (No Known Allergies)           Current Medications (11/17/2015):  This is the current hospital active medication list Current Facility-Administered Medications  Medication Dose Route Frequency Provider Last Rate Last Dose  . acetaminophen (TYLENOL) tablet 650 mg  650 mg Oral Q6H PRN Sylvan Cheese, MD   650 mg at 11/12/15 0051  . aspirin EC tablet 81 mg  81 mg Oral Daily Juluis Mire, MD   81 mg at 11/17/15 1006  . feeding supplement (NEPRO CARB STEADY) liquid 237 mL  237 mL Oral BID BM Harmeet Singh, MD   237 mL at 11/17/15 1000  . heparin injection 5,000 Units  5,000 Units Subcutaneous 3 times per day Hillary Bow, MD   5,000 Units at 11/17/15 0509  . metoprolol tartrate (LOPRESSOR) tablet 25 mg  25 mg Oral BID Hillary Bow, MD   25 mg at 11/17/15 1006  . multivitamin with minerals tablet 1 tablet  1 tablet Oral Daily Hillary Bow, MD   1 tablet at 11/17/15 1007  . ondansetron (ZOFRAN) injection 4 mg  4 mg Intravenous Q6H PRN Hillary Bow, MD      . patiromer (VELTASSA) packet 8.4 g  8.4 g Oral Daily Murlean Iba, MD      .  polyethylene glycol (MIRALAX / GLYCOLAX) packet 17 g  17 g Oral Daily PRN Hillary Bow, MD      . senna-docusate (Senokot-S) tablet 1 tablet  1 tablet Oral BID Hillary Bow, MD   1 tablet at 11/17/15 1007  . sodium chloride flush (NS) 0.9 % injection 3 mL  3 mL Intravenous Q12H Juluis Mire, MD   3 mL at 11/17/15 1007     Discharge Medications: Please see discharge summary for a list of discharge medications.  Relevant Imaging Results:  Relevant Lab Results:   Additional Information  (SSN [id number])  Lorenso Quarry Caydin Yeatts, LCSW

## 2015-11-17 NOTE — Progress Notes (Signed)
[place] [facility] [place] NAME: Jennee Nappa    MR#:  [id number]  DATE OF BIRTH:  1941-01-09  SUBJECTIVE:  CHIEF COMPLAINT:   Chief Complaint  Patient presents with  . Leg Swelling   admitted for shortness of breath and lower extremity swelling and found to be in atrial fibrillation with rapid ventricular rate. Left thoracentesis done and 1.7 L fluid removed on 11/11/2015.  She is about the same. Weak. Once to go home. Poor appetite. No further vomiting. No abdominal pain. Oliguric with 200 ML urine in the last 24 hours.  REVIEW OF SYSTEMS:    Review of Systems  Constitutional: Positive for malaise/fatigue. Negative for fever and chills.  HENT: Negative for sore throat.   Eyes: Negative for blurred vision, double vision and pain.  Respiratory: Positive for cough and shortness of breath. Negative for hemoptysis and wheezing.   Cardiovascular: Positive for orthopnea and leg swelling. Negative for chest pain and palpitations.  Gastrointestinal: Negative for heartburn, nausea, vomiting, abdominal pain, diarrhea and constipation.  Genitourinary: Negative for dysuria and hematuria.  Musculoskeletal: Negative for back pain and joint pain.  Skin: Negative for rash.  Neurological: Positive for dizziness and weakness. Negative for sensory change, speech change, focal weakness and headaches.  Endo/Heme/Allergies: Does not bruise/bleed easily.  Psychiatric/Behavioral: Negative for depression. The patient is not nervous/anxious.     DRUG ALLERGIES:  No Known Allergies  VITALS:  Blood pressure 119/67, pulse 86, temperature 98 F (36.7 C), temperature source Oral, resp. rate 16, height 5\' 7"  (1.702 m), weight 102.8 kg (226 lb 10.1 oz), SpO2 97 %.  PHYSICAL EXAMINATION:   Physical Exam  GENERAL:  75 y.o.-year-old patient lying in the bed. Anasarca. EYES: Pupils equal, round, reactive to light and accommodation. No scleral icterus.  Extraocular muscles intact.  HEENT: Head atraumatic, normocephalic. Oropharynx and nasopharynx clear.  NECK:  Supple, no jugular venous distention. No thyroid enlargement, no tenderness.  LUNGS: Bilateral crackles. Decreased air entry at the bases. CARDIOVASCULAR: S1, S2 normal. No murmurs, rubs, or gallops. Irregular ABDOMEN: Soft, nontender, nondistended. Bowel sounds present. No organomegaly or mass.  EXTREMITIES: No cyanosis, clubbing. 2+ lower extremity edema NEUROLOGIC: Cranial nerves II through XII are intact. No focal Motor or sensory deficits b/l.   PSYCHIATRIC: The patient is alert and oriented x 3. Flat affect. SKIN: No obvious rash, lesion, or ulcer.   LABORATORY PANEL:   CBC  Recent Labs Lab 11/16/15 0539  WBC 9.2  HGB 12.1  HCT 36.7  PLT 229   ------------------------------------------------------------------------------------------------------------------ Chemistries   Recent Labs Lab 11/17/15 0415  NA 136  K 5.8*  CL 108  CO2 23  GLUCOSE 122*  BUN 82*  CREATININE 6.14*  CALCIUM 7.8*  AST 35  ALT 32  ALKPHOS 65  BILITOT 0.3   ------------------------------------------------------------------------------------------------------------------  Cardiac Enzymes  Recent Labs Lab 11/11/15 1226  TROPONINI 0.08*   ------------------------------------------------------------------------------------------------------------------  RADIOLOGY:  Dg Abd 1 View  11/16/2015  CLINICAL DATA:  Ileus EXAM: ABDOMEN - 1 VIEW COMPARISON:  11/15/2015 FINDINGS: Scattered large and small bowel gas is noted. The degree of air-filled small bowel loops has decreased in the interval from the prior exam. Fecal material is noted within the colon. No acute bony abnormality is seen. IMPRESSION: No acute abnormality noted. Electronically Signed   By: Inez Catalina M.D.   On: 11/16/2015 13:11   US Abdomen Limited Ruq  11/16/2015  CLINICAL DATA:  75 year old female with vomiting for 1  week. Initial encounter. EXAM: US ABDOMEN LIMITED - RIGHT UPPER QUADRANT COMPARISON:  Renal ultrasound 11/11/2015 FINDINGS: Gallbladder: Shadowing up to 3.1 cm echogenic focus in the lumen is mobile compatible with a large stone. Gallbladder wall thickness remains normal at 2-3 mm. No pericholecystic fluid. No sonographic Murphy sign elicited. Common bile duct: Diameter: 4 mm, normal Liver: Trace perihepatic ascites. No discrete liver lesion or intrahepatic biliary ductal dilatation. Liver echogenicity within normal limits. Other findings: Right pleural effusion (image 37). IMPRESSION: 1. Gallstone measuring up to 3.1 cm within the gallbladder, but no evidence of acute cholecystitis or biliary obstruction. 2. Trace perihepatic ascites. 3. Right pleural effusion. Electronically Signed   By: Genevie Ann M.D.   On: 11/16/2015 11:45     ASSESSMENT AND PLAN:  75 year old female patient with history of diastolic CHF, chronic troponin elevation, atrial fibrillation and noncompliance presents to the hospital with shortness of breath and lower extremity swelling.  * Acute renal failure due to likely cardiorenal syndrome with hyperkalemia - worsening Lasix stopped due to worsening renal function. Patient is not keen on permanent  dialysis.  See below about discussion with patient  * Bilateral pleural effusions left greater than right due to congestive heart failure and hypoalbuminemia Status post left thoracentesis with 1.7 L.  Transudate.  * Acute on chronic diastolic congestive heart failure with acute respiratory failure - worsening - Stopped Lasix due to worsening renal function - Beta blockers - Input and Output, Counseled to limit fluids and Salt - Monitor Bun/Cr and Potassium - Repeat chest x-ray showed worsening pleural effusions - Will need dialysis  * A. Fib with RVR Rate controlled with metoprolol. Cardizem discontinued due to hypotension  * UTI - Cx multiple organisms Stop IV  ceftriaxone  * Hypertension On metoprolol  * Ileus Resolved with Dulcolax suppository. Repeat x-ray was normal  * Diabetes mellitus Sliding scale insulin  * Chronic mild elevation of troponin is stable  All the records are reviewed and case discussed with Care Management/Social Workerr. Management plans discussed with the patient and in agreement.  Discussed with patient again today regarding need for dialysis. She tells me that she would consider temporary dialysis and she makes a decision does not want to be on permanent dialysis. She wants to talk to her 3 sons but mentions that she is waiting for them to call her and she doesn't want to call them. I did tell her that she needs dialysis in the next few hours or it might have to be started emergently. Explained to her high risk for cardiac arrest and death if she doesn't get started on dialysis. Patient understands the risks and requests more time to think about it. Patient also unable to make a decision regarding her CODE STATUS.  CODE STATUS: FULL CODE  DVT Prophylaxis: SCDs  TOTAL TIME TAKING CARE OF THIS PATIENT: >35 minutes.  > 50% time spent in co-ordination of care and discussing with patient at bedside  Hillary Bow R M.D on 11/17/2015 at 1:17 PM  Between 7am to 6pm - Pager - [phone]  After 6pm go to [email or web address] - password [email or web address]  [facility]  Office  [phone]  CC: Primary care physician; No PCP Per Patient  Note: This dictation was prepared with Dragon dictation along with smaller phrase technology. Any transcriptional errors that result from this process are unintentional.

## 2015-11-17 NOTE — Care Management (Signed)
It is becoming more apparent that patient is going to require dialysis. Attending has discussed with patient.  Palliative care to see patient today to discuss further.  Attending has spoken with son Delfino Lovett on the phone and explained dialysis.  Richard was not able to verbalize understanding of this treatment or reason for need.  Updated clinical social work.

## 2015-11-17 NOTE — Progress Notes (Signed)
Notified Dr. Reece Levy of K level of 5.8. Kayexalate ordered.

## 2015-11-17 NOTE — Progress Notes (Signed)
A&O. Had several episodes of diarrhea during the night after receiving kayexalate. Foley in place with poor output. 2L O2. Afib on tele.

## 2015-11-18 LAB — GLUCOSE, CAPILLARY
GLUCOSE-CAPILLARY: 105 mg/dL — AB (ref 65–99)
GLUCOSE-CAPILLARY: 124 mg/dL — AB (ref 65–99)
GLUCOSE-CAPILLARY: 107 mg/dL — AB (ref 65–99)
Glucose-Capillary: 119 mg/dL — ABNORMAL HIGH (ref 65–99)

## 2015-11-18 LAB — BASIC METABOLIC PANEL
ANION GAP: 8 (ref 5–15)
BUN: 86 mg/dL — ABNORMAL HIGH (ref 6–20)
CHLORIDE: 106 mmol/L (ref 101–111)
CO2: 23 mmol/L (ref 22–32)
Calcium: 7.7 mg/dL — ABNORMAL LOW (ref 8.9–10.3)
Creatinine, Ser: 6.51 mg/dL — ABNORMAL HIGH (ref 0.44–1.00)
GFR calc non Af Amer: 6 mL/min — ABNORMAL LOW (ref >60)
GFR, EST AFRICAN AMERICAN: 6 mL/min — AB (ref >60)
Glucose, Bld: 114 mg/dL — ABNORMAL HIGH (ref 65–99)
POTASSIUM: 4.7 mmol/L (ref 3.5–5.1)
SODIUM: 137 mmol/L (ref 135–145)

## 2015-11-18 MED ORDER — CEFAZOLIN SODIUM 1-5 GM-% IV SOLN
1.0000 g | INTRAVENOUS | Status: DC
  Filled 2015-11-18: qty 50

## 2015-11-18 NOTE — Care Management (Signed)
Patient is now agreeable to dialysis.  Perm cath to be placed

## 2015-11-18 NOTE — Progress Notes (Signed)
[place] [facility] [place] NAME: Lisa Lowery    MR#:  [id number]  DATE OF BIRTH:  [birth year]  SUBJECTIVE:pt willing for HD.no SOB or cough.for permacath  By vascular.  CHIEF COMPLAINT:   Chief Complaint  Patient presents with  . Leg Swelling   admitted for shortness of breath and lower extremity swelling and found to be in atrial fibrillation with rapid ventricular rate. Left thoracentesis done and 1.7 L fluid removed on 11/11/2015.  She is about the same. Weak. Once to go home. Poor appetite. No further vomiting. No abdominal pain. Oliguric with 200 ML urine in the last 24 hours.  REVIEW OF SYSTEMS:    Review of Systems  Constitutional: Positive for malaise/fatigue. Negative for fever and chills.  HENT: Negative for sore throat.   Eyes: Negative for blurred vision, double vision and pain.  Respiratory: Positive for cough. Negative for hemoptysis, shortness of breath and wheezing.   Cardiovascular: Positive for orthopnea and leg swelling. Negative for chest pain and palpitations.  Gastrointestinal: Negative for heartburn, nausea, vomiting, abdominal pain, diarrhea and constipation.  Genitourinary: Negative for dysuria and hematuria.  Musculoskeletal: Negative for back pain and joint pain.  Skin: Negative for rash.  Neurological: Positive for dizziness and weakness. Negative for sensory change, speech change, focal weakness and headaches.  Endo/Heme/Allergies: Does not bruise/bleed easily.  Psychiatric/Behavioral: Negative for depression. The patient is not nervous/anxious.     DRUG ALLERGIES:  No Known Allergies  VITALS:  Blood pressure 118/63, pulse 93, temperature 98.1 F (36.7 C), temperature source Oral, resp. rate 16, height 5\' 7"  (1.702 m), weight 103.5 kg (228 lb 2.8 oz), SpO2 95 %.  PHYSICAL EXAMINATION:   Physical Exam  GENERAL:  75 y.o.-year-old patient lying in the bed. Anasarca. EYES: Pupils equal, round,  reactive to light and accommodation. No scleral icterus. Extraocular muscles intact.  HEENT: Head atraumatic, normocephalic. Oropharynx and nasopharynx clear.  NECK:  Supple, no jugular venous distention. No thyroid enlargement, no tenderness.  LUNGS: Bilateral crackles. Decreased air entry at the bases. CARDIOVASCULAR: S1, S2 normal. No murmurs, rubs, or gallops. Irregular ABDOMEN: Soft, nontender, nondistended. Bowel sounds present. No organomegaly or mass.  EXTREMITIES: No cyanosis, clubbing. 2+ lower extremity edema NEUROLOGIC: Cranial nerves II through XII are intact. No focal Motor or sensory deficits b/l.   PSYCHIATRIC: The patient is alert and oriented x 3. Flat affect. SKIN: No obvious rash, lesion, or ulcer.   LABORATORY PANEL:   CBC  Recent Labs Lab 11/16/15 0539  WBC 9.2  HGB 12.1  HCT 36.7  PLT 229   ------------------------------------------------------------------------------------------------------------------ Chemistries   Recent Labs Lab 11/17/15 0415 11/18/15 0418  NA 136 137  K 5.8* 4.7  CL 108 106  CO2 23 23  GLUCOSE 122* 114*  BUN 82* 86*  CREATININE 6.14* 6.51*  CALCIUM 7.8* 7.7*  AST 35  --   ALT 32  --   ALKPHOS 65  --   BILITOT 0.3  --    ------------------------------------------------------------------------------------------------------------------  Cardiac Enzymes No results for input(s): TROPONINI in the last 168 hours. ------------------------------------------------------------------------------------------------------------------  RADIOLOGY:  No results found.   ASSESSMENT AND PLAN:  75 year old female patient with history of diastolic CHF, chronic troponin elevation, atrial fibrillation and noncompliance presents to the hospital with shortness of breath and lower extremity swelling.  * Acute renal failure due to likely cardiorenal syndrome with hyperkalemia - worsening Lasix stopped due to worsening renal function. Agreed  for HD. Now,for  permacath placement  by vascular.   * Bilateral pleural effusions left greater than right due to congestive heart failure and hypoalbuminemia Status post left thoracentesis with 1.7 L.  Transudate.  * Acute on chronic diastolic congestive heart failure with acute respiratory failure - worsening - Stopped Lasix due to worsening renal function - Beta blockers - Input and Output, Counseled to limit fluids and Salt - Monitor Bun/Cr and Potassium - Repeat chest x-ray showed worsening pleural effusions - Will need dialysis,pt finally agreed  * A. Fib with RVR Rate controlled with metoprolol. Cardizem discontinued due to hypotension  * UTI - Cx multiple organisms Stop IV ceftriaxone  * Hypertension On metoprolol  * Ileus Resolved with Dulcolax suppository. Repeat x-ray was normal  * Diabetes mellitus Sliding scale insulin  * Chronic mild elevation of troponin is stable  All the records are reviewed and case discussed with Care Management/Social Workerr. Management plans discussed with the patient and in agreement.   CODE STATUS: FULL CODE  DVT Prophylaxis: SCDs  TOTAL TIME TAKING CARE OF THIS PATIENT:25 min  Niko Jakel M.D on 11/18/2015 at 4:36 PM  Between 7am to 6pm - Pager - [phone]  After 6pm go to [email or web address] - password [email or web address] [facility]  [facility]  Office  [phone]  CC: Primary care physician; No PCP Per Patient  Note: This dictation was prepared with Dragon dictation along with smaller phrase technology. Any transcriptional errors that result from this process are unintentional.

## 2015-11-18 NOTE — Progress Notes (Signed)
[facility]  ROUNDING NOTE   Subjective:   Patient continues to have anasarca Urine output poor (foley collection) States that her breathing is okay Appetite is poor   Potassium level is normal today; received SPS Serum creatinine and BUN levels continue to worsen Patient states she has talked with her family. She is now willing for dialysis.   Objective:  Vital signs in last 24 hours:  Temp:  [97.9 F (36.6 C)-98 F (36.7 C)] 98 F (36.7 C) (04/06 0425) Pulse Rate:  [85-90] 90 (04/06 0745) Resp:  [16-21] 18 (04/06 0745) BP: (109-119)/(48-67) 116/58 mmHg (04/06 0745) SpO2:  [96 %-99 %] 96 % (04/06 0745) Weight:  [103.5 kg (228 lb 2.8 oz)] 103.5 kg (228 lb 2.8 oz) (04/06 0500)  Weight change: 0.7 kg (1 lb 8.7 oz) Filed Weights   11/16/15 0401 11/17/15 0513 11/18/15 0500  Weight: 100.754 kg (222 lb 2 oz) 102.8 kg (226 lb 10.1 oz) 103.5 kg (228 lb 2.8 oz)    Intake/Output: I/O last 3 completed shifts: In: -  Out: 180 [Urine:180]   Intake/Output this shift:  Total I/O In: 240 [P.O.:240] Out: -   Physical Exam: General: NAD, chronically ill appearing, laying in bed  Head: Normocephalic, atraumatic. Dry oral mucosal membranes  Eyes: Anicteric,   Neck: Supple, trachea midline  Lungs:  Clear ant/lat, [place] o2  Heart: Regular rate and rhythm  Abdomen:  Soft, nontender,   Extremities: ++ peripheral edema all 4 extremities.  Neurologic: Nonfocal, moving all four extremities, alert, following commands  Skin: No acute lesions       Basic Metabolic Panel:  Recent Labs Lab 11/14/15 0510 11/15/15 0420 11/16/15 0539 11/17/15 0415 11/18/15 0418  NA 134*  135 134* 134* 136 137  K 5.1  5.1 5.3* 5.8* 5.8* 4.7  CL 108  107 107 105 108 106  CO2 21*  21* 21* 23 23 23   GLUCOSE 127*  129* 117* 103* 122* 114*  BUN 60*  60* 67* 74* 82* 86*  CREATININE 4.22*  4.02* 4.83* 5.43* 6.14* 6.51*  CALCIUM 7.9*  7.9* 7.8* 8.1* 7.8* 7.7*  PHOS 5.1*  --   --   --    --     Liver Function Tests:  Recent Labs Lab 11/12/15 0407 11/14/15 0510 11/15/15 0420 11/17/15 0415  AST 38  --  46* 35  ALT 29  --  38 32  ALKPHOS 53  --  64 65  BILITOT 0.6  --  0.5 0.3  PROT 6.4*  --  7.1 7.1  ALBUMIN 1.2* 2.2* 1.9* 1.7*   No results for input(s): LIPASE, AMYLASE in the last 168 hours. No results for input(s): AMMONIA in the last 168 hours.  CBC:  Recent Labs Lab 11/16/15 0539  WBC 9.2  NEUTROABS 7.2*  HGB 12.1  HCT 36.7  MCV 90.0  PLT 229    Cardiac Enzymes:  Recent Labs Lab 11/11/15 1226  TROPONINI 0.08*    BNP: Invalid input(s): POCBNP  CBG:  Recent Labs Lab 11/16/15 2149 11/17/15 0743 11/17/15 1122 11/17/15 1617 11/18/15 0748  GLUCAP 93 109* 122* 122* 105*    Microbiology: Results for orders placed or performed during the hospital encounter of 11/10/15  Urine culture     Status: None   Collection Time: 11/11/15  3:44 AM  Result Value Ref Range Status   Specimen Description URINE, RANDOM  Final   Special Requests NONE  Final   Culture MULTIPLE SPECIES PRESENT, SUGGEST RECOLLECTION  Final   Report Status 11/13/2015 FINAL  Final  Culture, body fluid-bottle     Status: None   Collection Time: 11/11/15 11:45 AM  Result Value Ref Range Status   Specimen Description PLEURAL  Final   Special Requests NONE  Final   Gram Stain FEW WBC SEEN NO ORGANISMS SEEN   Final   Culture NO GROWTH 5 DAYS  Final   Report Status 11/16/2015 FINAL  Final    Coagulation Studies: No results for input(s): LABPROT, INR in the last 72 hours.  Urinalysis: No results for input(s): COLORURINE, LABSPEC, PHURINE, GLUCOSEU, HGBUR, BILIRUBINUR, KETONESUR, PROTEINUR, UROBILINOGEN, NITRITE, LEUKOCYTESUR in the last 72 hours.  Invalid input(s): APPERANCEUR    Imaging: Dg Abd 1 View  11/16/2015  CLINICAL DATA:  Ileus EXAM: ABDOMEN - 1 VIEW COMPARISON:  11/15/2015 FINDINGS: Scattered large and small bowel gas is noted. The degree of air-filled  small bowel loops has decreased in the interval from the prior exam. Fecal material is noted within the colon. No acute bony abnormality is seen. IMPRESSION: No acute abnormality noted. Electronically Signed   By: Inez Catalina M.D.   On: 11/16/2015 13:11   US Abdomen Limited Ruq  11/16/2015  CLINICAL DATA:  75 year old female with vomiting for 1 week. Initial encounter. EXAM: US ABDOMEN LIMITED - RIGHT UPPER QUADRANT COMPARISON:  Renal ultrasound 11/11/2015 FINDINGS: Gallbladder: Shadowing up to 3.1 cm echogenic focus in the lumen is mobile compatible with a large stone. Gallbladder wall thickness remains normal at 2-3 mm. No pericholecystic fluid. No sonographic Murphy sign elicited. Common bile duct: Diameter: 4 mm, normal Liver: Trace perihepatic ascites. No discrete liver lesion or intrahepatic biliary ductal dilatation. Liver echogenicity within normal limits. Other findings: Right pleural effusion (image 37). IMPRESSION: 1. Gallstone measuring up to 3.1 cm within the gallbladder, but no evidence of acute cholecystitis or biliary obstruction. 2. Trace perihepatic ascites. 3. Right pleural effusion. Electronically Signed   By: Genevie Ann M.D.   On: 11/16/2015 11:45     Medications:     . aspirin EC  81 mg Oral Daily  . feeding supplement (NEPRO CARB STEADY)  237 mL Oral BID BM  . heparin subcutaneous  5,000 Units Subcutaneous 3 times per day  . metoprolol tartrate  25 mg Oral BID  . multivitamin with minerals  1 tablet Oral Daily  . patiromer  8.4 g Oral Daily  . senna-docusate  1 tablet Oral BID  . sodium chloride flush  3 mL Intravenous Q12H   acetaminophen, ondansetron (ZOFRAN) IV, polyethylene glycol  Assessment/ Plan:  Lisa Lowery is a 75 y.o. white female with atrial fibrillation, diabetes mellitus type II, hypertension, vertigo, coronary artery disease, who was admitted to [facility] on 11/10/2015 with left pleural effusion. Status post left thoracentesis on 11/11/15  1. Acute renal  failure on chronic kidney disease stage III with proteinuria: baseline creatinine 1.25 eGFR 41 from 09/11/15. Acute renal failure from acute cardiorenal syndrome. Recent thoracentesis (left) on 3/30 where 1.7 L of fluid was removed. Patient with multiple episodes of hypotension on 3/31, likely resulting in ATN  - With worsening S Cr and BUN, significant edema, recommend Dialysis to patient - Patient states she has now talked with her family and is agreeable to dialysis.  - Agree with palliative care evaluation to clarify goals of care - Vascular consult for permcath placement for tomorrow   2. Hypertension: with atrial fibrillation.  Echo from 1/17 reviewed, diastolic dysfunction. With anasarca on examination - metoprolol  for rate control.  - Holding furosemide and albumin.    3. Diabetes mellitus type II Insulin dependent with chronic kidney disease: hemoglobin A1c from 09/11/15 5.1% - Continue glucose control  4. Hyperkalemia - start Veltassa for chronic potassium control - expected to improve with HD  5. Generalized edema and Left pleural effusion: status post ultrasound guided thoracentesis on 3/30 with 1.7 litres removed.  - see above regarding discussion about dialysis      LOS: 8 Verneice Caspers 4/6/201711:07 AM

## 2015-11-18 NOTE — Progress Notes (Signed)
Physical Therapy Treatment Patient Details Name: Ryeleigh Kimes MRN: [id number] DOB: 1941/07/17 Today's Date: 11/18/2015    History of Present Illness Patient is a 75 y/o female admitted with LE swelling, shortness of breath, a-fib with RVR. Noted to have demand ischemia. Had L thoracentesis on 3/30 to remove 1.7 L    PT Comments    Pt requires Mod of 1 to Min of 2 for all mobility; especially good idea to have 2 for safety, due to pt's anxious nature. Pt wished up from bed to use bedside commode; requires total assist for toileting. Encouraged pt to stay out of bed and up in chair. Attempted walking around bed to chair only amounted to 6 ft ambulation with pt abruptly quitting requiring increased assist/encouragement of 2 to take several steps back and around to bed. Pt sits without being backed up safely to bed; however, therapist/nsg asst able to assist pt well enough to reach edge of bed. Pt anxious, but agreeable to rest and transfer several steps to recliner, which was brought to pt. Once in chair, pt continues anxious asking multiple times with multiple reasons why she should return to bed (ie. Feeling weak, subjective shakes although not visible and surgical procedure scheduled for tomorrow.) Pt education regarding benefits of out of bed in chair. During this documentation, pt becomes much more restful and at ease in the chair. Continue PT to progress strength, endurance and all functional mobility.   Follow Up Recommendations  SNF     Equipment Recommendations       Recommendations for Other Services       Precautions / Restrictions Precautions Precautions: Fall Restrictions Weight Bearing Restrictions: No    Mobility  Bed Mobility Overal bed mobility: Needs Assistance Bed Mobility: Supine to Sit     Supine to sit: Mod assist     General bed mobility comments: slow/effortful with assist at LEs and trunk  Transfers Overall transfer level: Needs assistance Equipment used:  Rolling walker (2 wheeled) Transfers: Sit to/from Stand Sit to Stand: Min assist;+2 physical assistance;+2 safety/equipment         General transfer comment: performed 3x with Min to Mod A . Improves with detailed step by step instruction and encouragement. Pt mildly anxious and self limiting  Ambulation/Gait Ambulation/Gait assistance: Min assist;+2 safety/equipment Ambulation Distance (Feet): 6 Feet (Attempting to amb around bed to chair; fatigues/anxious) Assistive device: Rolling walker (2 wheeled) Gait Pattern/deviations: Step-to pattern;Trunk flexed (effortful) Gait velocity: slow Gait velocity interpretation: <1.8 ft/sec, indicative of risk for recurrent falls General Gait Details: bed to bedside commode initially. Attempted ambulation around bed to chair; pt amb's 6 ft with increasing anxiety and "I can't talk". Requires cues for 3 step sequence and although demonstrating ability, fatigues and states she is going to fall, quitting suddenly. Assist of 2 to encourage pt to stay upright and turn to back up to bed. Pt rests and is then able to take several steps to recliner brought closer to her.    Stairs            Wheelchair Mobility    Modified Rankin (Stroke Patients Only)       Balance Overall balance assessment: Needs assistance Sitting-balance support: Feet supported;Single extremity supported Sitting balance-Leahy Scale: Good     Standing balance support: Bilateral upper extremity supported Standing balance-Leahy Scale: Fair                      Cognition Arousal/Alertness: Awake/alert Behavior During Therapy: [facility]  for tasks assessed/performed;Anxious Overall Cognitive Status: Within Functional Limits for tasks assessed                      Exercises General Exercises - Lower Extremity Ankle Circles/Pumps: AROM;Both;20 reps (long sit) Quad Sets: Strengthening;Both;20 reps (long sit) Gluteal Sets: Strengthening;Both;20 reps (long  sit) Other Exercises Other Exercises: total assist for toileting/personal hygiene    General Comments        Pertinent Vitals/Pain Pain Assessment: No/denies pain (Other than pressure low abdomen due to BMs)    Home Living                      Prior Function            PT Goals (current goals can now be found in the care plan section)      Frequency  Min 2X/week    PT Plan Current plan remains appropriate    Co-evaluation             End of Session Equipment Utilized During Treatment: Gait belt;Oxygen Activity Tolerance: Patient limited by fatigue (self limiting) Patient left: in chair;with call bell/phone within reach;with chair alarm set     Time: [id number] PT Time Calculation (min) (ACUTE ONLY): 31 min  Charges:  $Gait Training: 8-22 mins $Therapeutic Activity: 8-22 mins                    G Codes:      Charlaine Dalton, PTA 11/18/2015, 2:42 PM

## 2015-11-19 ENCOUNTER — Encounter
Admission: EM | Disposition: A | Discharge status: Skilled Nursing Facility | Payer: Self-pay | Source: Home / Self Care | Attending: Internal Medicine

## 2015-11-19 HISTORY — PX: PERIPHERAL VASCULAR CATHETERIZATION: SHX172C

## 2015-11-19 LAB — RENAL FUNCTION PANEL
ALBUMIN: 1.5 g/dL — AB (ref 3.5–5.0)
Anion gap: 5 (ref 5–15)
BUN: 92 mg/dL — ABNORMAL HIGH (ref 6–20)
CHLORIDE: 107 mmol/L (ref 101–111)
CO2: 23 mmol/L (ref 22–32)
CREATININE: 6.82 mg/dL — AB (ref 0.44–1.00)
Calcium: 7.5 mg/dL — ABNORMAL LOW (ref 8.9–10.3)
GFR, EST AFRICAN AMERICAN: 6 mL/min — AB (ref >60)
GFR, EST NON AFRICAN AMERICAN: 5 mL/min — AB (ref >60)
Glucose, Bld: 98 mg/dL (ref 65–99)
PHOSPHORUS: 7.6 mg/dL — AB (ref 2.5–4.6)
POTASSIUM: 4.4 mmol/L (ref 3.5–5.1)
Sodium: 135 mmol/L (ref 135–145)

## 2015-11-19 LAB — URINALYSIS COMPLETE WITH MICROSCOPIC (ARMC ONLY)
Bilirubin Urine: NEGATIVE
GLUCOSE, UA: NEGATIVE mg/dL
Ketones, ur: NEGATIVE mg/dL
Nitrite: NEGATIVE
Protein, ur: >500 mg/dL — AB
SPECIFIC GRAVITY, URINE: 1.018 (ref 1.005–1.030)
TRANS EPITHEL UA: 2
pH: 5 (ref 5.0–8.0)

## 2015-11-19 LAB — PROTEIN / CREATININE RATIO, URINE
Creatinine, Urine: 218 mg/dL
PROTEIN CREATININE RATIO: 5.03 mg/mg{creat} — AB (ref 0.00–0.15)
Total Protein, Urine: 1096 mg/dL

## 2015-11-19 LAB — CBC
HEMATOCRIT: 34.5 % — AB (ref 35.0–47.0)
HEMOGLOBIN: 11.6 g/dL — AB (ref 12.0–16.0)
MCH: 29.9 pg (ref 26.0–34.0)
MCHC: 33.5 g/dL (ref 32.0–36.0)
MCV: 89.3 fL (ref 80.0–100.0)
PLATELETS: 214 10*3/uL (ref 150–440)
RBC: 3.87 MIL/uL (ref 3.80–5.20)
RDW: 14.5 % (ref 11.5–14.5)
WBC: 8.7 10*3/uL (ref 3.6–11.0)

## 2015-11-19 LAB — GLUCOSE, CAPILLARY
GLUCOSE-CAPILLARY: 97 mg/dL (ref 65–99)
GLUCOSE-CAPILLARY: 72 mg/dL (ref 65–99)
Glucose-Capillary: 129 mg/dL — ABNORMAL HIGH (ref 65–99)

## 2015-11-19 SURGERY — DIALYSIS/PERMA CATHETER INSERTION
Anesthesia: Moderate Sedation

## 2015-11-19 MED ORDER — ALBUMIN HUMAN 25 % IV SOLN
12.5000 g | Freq: Once | INTRAVENOUS | Status: AC
  Administered 2015-11-19: 12.5 g via INTRAVENOUS
  Filled 2015-11-19: qty 50

## 2015-11-19 MED ORDER — DEXTROSE 5 % IV SOLN
1.5000 g | Freq: Once | INTRAVENOUS | Status: AC
  Administered 2015-11-19: 1.5 g via INTRAVENOUS
  Filled 2015-11-19: qty 1.5

## 2015-11-19 MED ORDER — FENTANYL CITRATE (PF) 100 MCG/2ML IJ SOLN
INTRAMUSCULAR | Status: DC | PRN
  Administered 2015-11-19: 50 ug via INTRAVENOUS

## 2015-11-19 MED ORDER — SODIUM CHLORIDE 0.9 % IV SOLN
INTRAVENOUS | Status: DC
  Administered 2015-11-19: 12:00:00 via INTRAVENOUS

## 2015-11-19 MED ORDER — HEPARIN (PORCINE) IN NACL 2-0.9 UNIT/ML-% IJ SOLN
INTRAMUSCULAR | Status: AC
  Filled 2015-11-19: qty 500

## 2015-11-19 MED ORDER — FENTANYL CITRATE (PF) 100 MCG/2ML IJ SOLN
INTRAMUSCULAR | Status: AC
  Filled 2015-11-19: qty 2

## 2015-11-19 MED ORDER — HEPARIN SODIUM (PORCINE) 10000 UNIT/ML IJ SOLN
INTRAMUSCULAR | Status: AC
  Filled 2015-11-19: qty 1

## 2015-11-19 MED ORDER — MIDAZOLAM HCL 2 MG/2ML IJ SOLN
INTRAMUSCULAR | Status: DC | PRN
Start: 2015-11-19 — End: 2015-11-19
  Administered 2015-11-19: 2 mg via INTRAVENOUS

## 2015-11-19 MED ORDER — MIDAZOLAM HCL 5 MG/5ML IJ SOLN
INTRAMUSCULAR | Status: AC
  Filled 2015-11-19: qty 5

## 2015-11-19 MED ORDER — METOPROLOL TARTRATE 25 MG PO TABS
12.5000 mg | ORAL_TABLET | Freq: Two times a day (BID) | ORAL | Status: DC
  Administered 2015-11-20 – 2015-11-22 (×4): 12.5 mg via ORAL
  Administered 2015-11-23: 25 mg via ORAL
  Filled 2015-11-19 (×7): qty 1

## 2015-11-19 MED ORDER — LIDOCAINE-EPINEPHRINE (PF) 1 %-1:200000 IJ SOLN
INTRAMUSCULAR | Status: AC
  Filled 2015-11-19: qty 30

## 2015-11-19 SURGICAL SUPPLY — 8 items
CATH PALINDROME RT-P 15FX19CM (CATHETERS) ×3 IMPLANT
DERMABOND ADVANCED (GAUZE/BANDAGES/DRESSINGS) ×2
DERMABOND ADVANCED .7 DNX12 (GAUZE/BANDAGES/DRESSINGS) ×1 IMPLANT
DRAPE INCISE IOBAN 66X45 STRL (DRAPES) ×3 IMPLANT
PACK ANGIOGRAPHY (CUSTOM PROCEDURE TRAY) ×3 IMPLANT
SUT MNCRL AB 4-0 PS2 18 (SUTURE) ×3 IMPLANT
SUT SILK 0 FSL (SUTURE) ×3 IMPLANT
TOWEL OR 17X26 4PK STRL BLUE (TOWEL DISPOSABLE) ×3 IMPLANT

## 2015-11-19 NOTE — Progress Notes (Signed)
A&O. Very weak. Generalized swelling. BUN and creatinine continue to climb. Patient understands she needs dialysis and is possibly going for a permcath placement today. However patient expressed that she is not certain that she wants to take dialysis and will need to further discuss this with the doctor before going ahead with the procedure.

## 2015-11-19 NOTE — Progress Notes (Signed)
Lisa Lowery NAME: Lisa Lowery    MR#:  [id number]  DATE OF BIRTH:  11/03/1940  SUBJECTIVE;Finished first HD session today,tolerated well.  CHIEF COMPLAINT:   Chief Complaint  Patient presents with  . Leg Swelling   admitted for shortness of breath and lower extremity swelling and found to be in atrial fibrillation with rapid ventricular rate. Left thoracentesis done and 1.7 L fluid removed on 11/11/2015.  She is about the same. Weak. Once to go home. Poor appetite. No further vomiting. No abdominal pain. Oliguric with 200 ML urine in the last 24 hours.  REVIEW OF SYSTEMS:    Review of Systems  Constitutional: Positive for malaise/fatigue. Negative for fever and chills.  HENT: Negative for sore throat.   Eyes: Negative for blurred vision, double vision and pain.  Respiratory: Negative for cough, hemoptysis, shortness of breath and wheezing.   Cardiovascular: Positive for orthopnea and leg swelling. Negative for chest pain and palpitations.  Gastrointestinal: Negative for heartburn, nausea, vomiting, abdominal pain, diarrhea and constipation.  Genitourinary: Negative for dysuria and hematuria.  Musculoskeletal: Negative for back pain and joint pain.  Skin: Negative for rash.  Neurological: Positive for weakness. Negative for dizziness, sensory change, speech change, focal weakness and headaches.  Endo/Heme/Allergies: Does not bruise/bleed easily.  Psychiatric/Behavioral: Negative for depression. The patient is not nervous/anxious.     DRUG ALLERGIES:  No Known Allergies  VITALS:  Blood pressure 110/61, pulse 94, temperature 97.6 F (36.4 C), temperature source Oral, resp. rate 14, height 5\' 7"  (1.702 m), weight 106.6 kg (235 lb 0.2 oz), SpO2 97 %.  PHYSICAL EXAMINATION:   Physical Exam  GENERAL:  75 y.o.-year-old patient lying in the bed. Anasarca. EYES: Pupils equal, round, reactive to light and  accommodation. No scleral icterus. Extraocular muscles intact.  HEENT: Head atraumatic, normocephalic. Oropharynx and nasopharynx clear.  NECK:  Supple, no jugular venous distention. No thyroid enlargement, no tenderness.  LUNGS: decreased breath sounds bilateraly CARDIOVASCULAR: S1, S2 normal. No murmurs, rubs, or gallops. Irregular ABDOMEN: Soft, nontender, nondistended. Bowel sounds present. No organomegaly or mass.  EXTREMITIES: No cyanosis, clubbing. 2+ lower extremity edema NEUROLOGIC: Cranial nerves II through XII are intact. No focal Motor or sensory deficits b/l.   PSYCHIATRIC: The patient is alert and oriented x 3. Flat affect. SKIN: No obvious rash, lesion, or ulcer.   LABORATORY PANEL:   CBC  Recent Labs Lab 11/19/15 0632  WBC 8.7  HGB 11.6*  HCT 34.5*  PLT 214   ------------------------------------------------------------------------------------------------------------------ Chemistries   Recent Labs Lab 11/17/15 0415  11/19/15 0633  NA 136  < > 135  K 5.8*  < > 4.4  CL 108  < > 107  CO2 23  < > 23  GLUCOSE 122*  < > 98  BUN 82*  < > 92*  CREATININE 6.14*  < > 6.82*  CALCIUM 7.8*  < > 7.5*  AST 35  --   --   ALT 32  --   --   ALKPHOS 65  --   --   BILITOT 0.3  --   --   < > = values in this interval not displayed. ------------------------------------------------------------------------------------------------------------------  Cardiac Enzymes No results for input(s): TROPONINI in the last 168 hours. ------------------------------------------------------------------------------------------------------------------  RADIOLOGY:  No results found.   ASSESSMENT AND PLAN:  75 year old female patient with history of diastolic CHF, chronic troponin elevation, atrial fibrillation and noncompliance presents to the hospital with shortness of breath  and lower extremity swelling.  * Acute renal failure due to likely cardiorenal syndrome with hyperkalemia -  worsening Lasix stopped due to worsening renal function. Agreed for HD.,s/p Perma cath today and first HD today.   * Bilateral pleural effusions left greater than right due to congestive heart failure and hypoalbuminemia;pt started on IV albumin Status post left thoracentesis with 1.7 L.  Transudate.  * Acute on chronic diastolic congestive heart failure with acute respiratory failure - worsening - Stopped Lasix due to worsening renal function -  - Input and Output, Counseled to limit fluids and Salt,continue HD,  - * A. Fib with RVR Rate controlled with metoprolol.I decreased dose today due to hypotension/ Cardizem discontinued due to hypotension  * UTI - Cx multiple organisms Stop IV ceftriaxone  * Hypertension,now bp  is running low, Adjusted metoprolol  * Ileus Resolved with Dulcolax suppository. Repeat x-ray was normal  * Diabetes mellitus Sliding scale insulin  * Chronic mild elevation of troponin is stable  *Hyperkalemia;on Valtessa  Dispo;needs placement  All the records are reviewed and case discussed with Care Management/Social Workerr. Management plans discussed with the patient and in agreement.   CODE STATUS: FULL CODE  DVT Prophylaxis: SCDs  TOTAL TIME TAKING CARE OF THIS PATIENT:25 min  Lisa Lowery M.D on 11/19/2015 at 3:52 PM  Between 7am to 6pm - Pager - [phone]  After 6pm go to [email or web address] - password [email or web address] [facility]  [name]  Office  [phone]  CC: Primary care physician; No PCP Per Patient  Note: This dictation was prepared with Dragon dictation along with smaller phrase technology. Any transcriptional errors that result from this process are unintentional.

## 2015-11-19 NOTE — Progress Notes (Signed)
Pre-treatment assessment 

## 2015-11-19 NOTE — Progress Notes (Signed)
Cefuroxime (zinacef) IV abx ordered for 1600, but specifies to be given pre-op. However, it was never marked in the eMAR that it was given. This RN attempted to contact specials x2 and left voice message with no call back yet. Pharmacy consulted to ask what RN should do: if abx should be given or not. Per pharmacy, it would be better to hold off right now, as abx should have been given in specials pre-operatively.

## 2015-11-19 NOTE — Op Note (Signed)
[facility]   OPERATIVE NOTE     PROCEDURE: 1. Insertion right IJ tunneled dialysis catheter placement 2. Catheter placement and cannulation under ultrasound and fluoroscopic guidance  PRE-OPERATIVE DIAGNOSIS: end-stage renal requiring hemodialysis  POST-OPERATIVE DIAGNOSIS: same as above  SURGEON: Katha Cabal, M.D.  ANESTHESIA: Conscious sedation was administered under my direct supervision. IV Versed plus fentanyl were utilized. Continuous ECG, pulse oximetry and blood pressure was monitored throughout the entire procedure. A total of 3 milligrams of Versed and 100 micrograms of fentanyl were utilized.  Conscious sedation was for a total of 35 minutes.  ESTIMATED BLOOD LOSS: Minimal  FINDING(S): 1.  Tips of the catheter in the right atrium on fluoroscopy 2.  No obvious pneumothorax on fluoroscopy  SPECIMEN(S):  none  INDICATIONS:   Lisa Lowery is a 75 y.o. female  presents with end stage renal disease.  Therefore, the patient requires a tunneled dialysis catheter placement.  The patient is informed of  the risks catheter placement include but are not limited to: bleeding, infection, central venous injury, pneumothorax, possible venous stenosis, possible malpositioning in the venous system, and possible infections related to long-term catheter presence.  The patient was aware of these risks and agreed to proceed.  DESCRIPTION: The patient was taken back to Special Procedure suite.  Prior to sedation, the patient was given IV antibiotics.  After obtaining adequate sedation, the patient was prepped and draped in the standard fashion for a chest or neck tunneled dialysis catheter placement.  Appropriate Time Out is called.   The the neck and chest wall are then infiltrated with 1% Lidocaine with epinepherine.  A 19 cm tip to cuff palindrome catheter is then selected, opened on the back table and prepped. Under ultrasound guidance, the right internal  jugular vein was cannulated with the Seldinger needle.  A J-wire was then advanced under fluoroscopic guidance into the inferior vena cava and the wire was secured.  Small counter incision was then made at the wire insertion site. A small pocket was fashioned with blunt dissection to allow easier passage of the cuff.  The dilator and peel-away sheath are then advanced over the wire under fluoroscopic guidance. The catheters and advanced through the peel-away sheath after removal of the wire. It is approximated to the chest wall after verifying the tips at the atrial caval junction and an exit site is selected.  Small incision is made at the selected exit site and the tunneling device was passed subcutaneously to the neck counter incision. Catheter is then pulled through the subcutaneous tunnel. The catheter is then verified for tip position under fluoroscopy, transected and the hub assembly connected.    Each port was tested by aspirating and flushing.  No resistance was noted.  Each port was then thoroughly flushed with heparinized saline.  The catheter was secured in placed with two interrupted stitches of 0 silk tied to the catheter.  The neck incision was closed with a U-stitch of 4-0 Monocryl.  The neck and chest incision were cleaned and sterile bandages applied including a Biopatch.  Each port was then packed with concentrated heparin (1000 Units/mL) at the manufacturer recommended volumes to each port.  Sterile caps were applied to each port.  On completion fluoroscopy, the tips of the catheter were in the right atrium, and there was no evidence of pneumothorax.  COMPLICATIONS: None  CONDITION: Good   Katha Cabal M.D. [facility] Office: [phone]   11/19/2015, 2:20 PM

## 2015-11-19 NOTE — Care Management Note (Signed)
Medical records that are currently available have been sent to [facility] admission for placement at [facility].  Once additional records are complete I will forward.  Once all records are received at the center they will give me a chair time.  Iran Sizer  Dialysis Liaison  [phone]

## 2015-11-19 NOTE — Progress Notes (Signed)
Patients grandson Gwyndolyn Saxon, called to nurses station, provided password. Given update on patient condition and plan of care. Informed that patient has gone down to specials for permcath placement and will have dialysis afterwards. Grandson reports he will be coming by to see Ms. Conrow later today.

## 2015-11-19 NOTE — Progress Notes (Signed)
[facility]  ROUNDING NOTE   Subjective:   Patient continues to have anasarca Urine output remains poor (foley collection) States that her breathing is okay Appetite is poor   Potassium level is normal today; received SPS Serum creatinine and BUN levels continue to worsen Patient states she has talked with her family. She is now willing for dialysis. She is concerned that she has no place to go after hospitalization. One of her son has mental illness, One in [place] is not interested and the one in [place] just lost his job.    Objective:  Vital signs in last 24 hours:  Temp:  [97.3 F (36.3 C)-98.1 F (36.7 C)] 97.5 F (36.4 C) (04/07 0417) Pulse Rate:  [84-93] 88 (04/07 0417) Resp:  [16-18] 18 (04/07 0417) BP: (118-134)/(63-95) 134/95 mmHg (04/07 0417) SpO2:  [95 %-100 %] 100 % (04/07 0417) Weight:  [104.146 kg (229 lb 9.6 oz)] 104.146 kg (229 lb 9.6 oz) (04/07 0417)  Weight change: 0.646 kg (1 lb 6.8 oz) Filed Weights   11/17/15 0513 11/18/15 0500 11/19/15 0417  Weight: 102.8 kg (226 lb 10.1 oz) 103.5 kg (228 lb 2.8 oz) 104.146 kg (229 lb 9.6 oz)    Intake/Output: I/O last 3 completed shifts: In: 480 [P.O.:480] Out: 300 [Urine:300]   Intake/Output this shift:     Physical Exam: General: NAD, chronically ill appearing, laying in bed  Head: Normocephalic, atraumatic. Dry oral mucosal membranes  Eyes: Anicteric,   Neck: Supple, trachea midline  Lungs:  Clear ant/lat, [place] o2  Heart: Regular rate and rhythm  Abdomen:  Soft, nontender,   Extremities: ++ peripheral edema all 4 extremities./Anasarca  Neurologic: Nonfocal, moving all four extremities, alert, following commands  Skin: No acute lesions       Basic Metabolic Panel:  Recent Labs Lab 11/14/15 0510 11/15/15 0420 11/16/15 0539 11/17/15 0415 11/18/15 0418 11/19/15 0633  NA 134*  135 134* 134* 136 137 135  K 5.1  5.1 5.3* 5.8* 5.8* 4.7 4.4  CL 108  107 107 105 108 106 107   CO2 21*  21* 21* 23 23 23 23   GLUCOSE 127*  129* 117* 103* 122* 114* 98  BUN 60*  60* 67* 74* 82* 86* 92*  CREATININE 4.22*  4.02* 4.83* 5.43* 6.14* 6.51* 6.82*  CALCIUM 7.9*  7.9* 7.8* 8.1* 7.8* 7.7* 7.5*  PHOS 5.1*  --   --   --   --  7.6*    Liver Function Tests:  Recent Labs Lab 11/14/15 0510 11/15/15 0420 11/17/15 0415 11/19/15 0633  AST  --  46* 35  --   ALT  --  38 32  --   ALKPHOS  --  64 65  --   BILITOT  --  0.5 0.3  --   PROT  --  7.1 7.1  --   ALBUMIN 2.2* 1.9* 1.7* 1.5*   No results for input(s): LIPASE, AMYLASE in the last 168 hours. No results for input(s): AMMONIA in the last 168 hours.  CBC:  Recent Labs Lab 11/16/15 0539 11/19/15 0632  WBC 9.2 8.7  NEUTROABS 7.2*  --   HGB 12.1 11.6*  HCT 36.7 34.5*  MCV 90.0 89.3  PLT 229 214    Cardiac Enzymes: No results for input(s): CKTOTAL, CKMB, CKMBINDEX, TROPONINI in the last 168 hours.  BNP: Invalid input(s): POCBNP  CBG:  Recent Labs Lab 11/18/15 0748 11/18/15 1205 11/18/15 1610 11/18/15 2108 11/19/15 0737  GLUCAP 105* 124* 107* 119* 97  Microbiology: Results for orders placed or performed during the hospital encounter of 11/10/15  Urine culture     Status: None   Collection Time: 11/11/15  3:44 AM  Result Value Ref Range Status   Specimen Description URINE, RANDOM  Final   Special Requests NONE  Final   Culture MULTIPLE SPECIES PRESENT, SUGGEST RECOLLECTION  Final   Report Status 11/13/2015 FINAL  Final  Culture, body fluid-bottle     Status: None   Collection Time: 11/11/15 11:45 AM  Result Value Ref Range Status   Specimen Description PLEURAL  Final   Special Requests NONE  Final   Gram Stain FEW WBC SEEN NO ORGANISMS SEEN   Final   Culture NO GROWTH 5 DAYS  Final   Report Status 11/16/2015 FINAL  Final    Coagulation Studies: No results for input(s): LABPROT, INR in the last 72 hours.  Urinalysis: No results for input(s): COLORURINE, LABSPEC, PHURINE,  GLUCOSEU, HGBUR, BILIRUBINUR, KETONESUR, PROTEINUR, UROBILINOGEN, NITRITE, LEUKOCYTESUR in the last 72 hours.  Invalid input(s): APPERANCEUR    Imaging: No results found.   Medications:     . aspirin EC  81 mg Oral Daily  .  ceFAZolin (ANCEF) IV  1 g Intravenous On Call  . feeding supplement (NEPRO CARB STEADY)  237 mL Oral BID BM  . heparin subcutaneous  5,000 Units Subcutaneous 3 times per day  . metoprolol tartrate  25 mg Oral BID  . multivitamin with minerals  1 tablet Oral Daily  . patiromer  8.4 g Oral Daily  . senna-docusate  1 tablet Oral BID  . sodium chloride flush  3 mL Intravenous Q12H   acetaminophen, ondansetron (ZOFRAN) IV, polyethylene glycol  Assessment/ Plan:  Lisa Lowery is a 75 y.o. white female with atrial fibrillation, diabetes mellitus type II, hypertension, vertigo, coronary artery disease, who was admitted to [facility] on 11/10/2015 with left pleural effusion. Status post left thoracentesis on 11/11/15  1. Acute renal failure on chronic kidney disease stage III with proteinuria: baseline creatinine 1.25 eGFR 41 from 09/11/15. Acute renal failure from acute cardiorenal syndrome. Recent thoracentesis (left) on 3/30 where 1.7 L of fluid was removed. Patient with multiple episodes of hypotension on 3/31, likely resulting in ATN  - With worsening S Cr and BUN, significant edema, recommend Dialysis to patient - Patient states she has now talked with her family and is agreeable to dialysis.  - Agree with palliative care evaluation to clarify goals of care - Vascular consult for permcath placement   - Dialysis today   2. Hypertension: with atrial fibrillation.  Echo from 1/17 reviewed, diastolic dysfunction. With anasarca on examination - metoprolol for rate control.  - Holding furosemide and albumin.    3. Diabetes mellitus type II Insulin dependent with chronic kidney disease: hemoglobin A1c from 09/11/15 5.1% - Continue glucose control  4. Hyperkalemia -  start Veltassa for chronic potassium control - expected to improve with HD  5. Generalized edema/Anasarca and Left pleural effusion: - status post ultrasound guided thoracentesis on 3/30 with 1.7 litres removed.  - see above regarding discussion about dialysis  6. Proteinuria - Albumin 1.5 - urine P/C  - screening serologies      LOS: 9 Carolynn Tuley 4/7/201710:50 AM

## 2015-11-19 NOTE — Progress Notes (Signed)
Hemodialysis treatment completed.

## 2015-11-19 NOTE — Progress Notes (Signed)
Post hd tx 

## 2015-11-19 NOTE — NC FL2 (Signed)
[place] LEVEL OF CARE SCREENING TOOL     IDENTIFICATION  Patient Name: Lisa Lowery Birthdate: 06/15/1941 Sex: female Admission Date (Current Location): 11/10/2015  [place] and [place] Number:  Engineering geologist and Address:  [facility], [address] [place], [place] [address]      Provider Number: [id number]  Attending Physician Name and Address:  Epifanio Lesches, MD  Relative Name and Phone Number:       Current Level of Care: Hospital Recommended Level of Care: [place] Prior Approval Number:    Date Approved/Denied:   PASRR Number:  ([id number])  Discharge Plan: SNF    Current Diagnoses: Patient Active Problem List   Diagnosis Date Noted  . Pleural effusion, left 11/10/2015  . General weakness 11/10/2015  . DM2 (diabetes mellitus, type 2) ([place]) 11/10/2015  . Atrial fibrillation with RVR ([place]) 09/11/2015  . Essential hypertension, malignant 02/23/2015  . Renal insufficiency 02/23/2015  . DM (diabetes mellitus) type 2, uncontrolled, with ketoacidosis ([place]) 02/23/2015  . Dizziness 02/21/2015  . Elevated troponin 02/21/2015  . HTN (hypertension) 02/21/2015  . Elevated blood sugar 02/21/2015    Orientation RESPIRATION BLADDER Height & Weight     Self, Time, Situation, Place  Normal, O2 Incontinent Weight: 229 lb 9.6 oz (104.146 kg) Height:  5\' 7"  (170.2 cm)  BEHAVIORAL SYMPTOMS/MOOD NEUROLOGICAL BOWEL NUTRITION STATUS   (none)  (none) Incontinent Diet (regular)  AMBULATORY STATUS COMMUNICATION OF NEEDS Skin   Limited Assist Verbally Normal (dialysis access site)                       Personal Care Assistance Level of Assistance  Bathing, Feeding, Dressing Bathing Assistance: Limited assistance Feeding assistance: Limited assistance Dressing Assistance: Limited assistance     Functional Limitations Info  Sight, Hearing, Speech Sight Info: Adequate Hearing Info: Adequate Speech  Info: Adequate    SPECIAL CARE FACTORS FREQUENCY  PT (By licensed PT)     PT Frequency:  (5)              Contractures Contractures Info: Not present    Additional Factors Info  Code Status, Allergies Code Status Info: full Allergies Info: nka           Current Medications (11/19/2015):  This is the current hospital active medication list Current Facility-Administered Medications  Medication Dose Route Frequency Provider Last Rate Last Dose  . 0.9 %  sodium chloride infusion   Intravenous Continuous Katha Cabal, MD 20 mL/hr at 11/19/15 1130    . acetaminophen (TYLENOL) tablet 650 mg  650 mg Oral Q6H PRN Sylvan Cheese, MD   650 mg at 11/12/15 0051  . albumin human 25 % solution 12.5 g  12.5 g Intravenous Once Murlean Iba, MD      . aspirin EC tablet 81 mg  81 mg Oral Daily Juluis Mire, MD   Stopped at 11/19/15 1056  . feeding supplement (NEPRO CARB STEADY) liquid 237 mL  237 mL Oral BID BM Murlean Iba, MD   Stopped at 11/19/15 1056  . heparin injection 5,000 Units  5,000 Units Subcutaneous 3 times per day Hillary Bow, MD   5,000 Units at 11/18/15 2155  . metoprolol tartrate (LOPRESSOR) tablet 25 mg  25 mg Oral BID Hillary Bow, MD   Stopped at 11/19/15 1057  . multivitamin with minerals tablet 1 tablet  1 tablet Oral Daily Hillary Bow, MD   Stopped at 11/19/15 1057  .  ondansetron (ZOFRAN) injection 4 mg  4 mg Intravenous Q6H PRN Hillary Bow, MD      . patiromer Daryll Drown) packet 8.4 g  8.4 g Oral Daily Murlean Iba, MD   Stopped at 11/19/15 1057  . polyethylene glycol (MIRALAX / GLYCOLAX) packet 17 g  17 g Oral Daily PRN Hillary Bow, MD      . senna-docusate (Senokot-S) tablet 1 tablet  1 tablet Oral BID Hillary Bow, MD   Stopped at 11/19/15 1057  . sodium chloride flush (NS) 0.9 % injection 3 mL  3 mL Intravenous Q12H Juluis Mire, MD   Stopped at 11/19/15 1057     Discharge Medications: Please see discharge summary for a list of discharge  medications.  Relevant Imaging Results:  Relevant Lab Results:   Additional Information SS: [id number]  Shela Leff, LCSW

## 2015-11-19 NOTE — Progress Notes (Signed)
This morning with early rounds patient told this RN that she was amenable to having temporary dialysis catheter placed today. She informed RN that she would sign consent forms. However, when RN rounded again and brought consent forms, patient reported she wanted to wait to hear from social work first to see if she has a place to stay when she leaves the hospital. Patient reports she wants to have this taken care of before she has her procedure today. Will notify CSW of patient request in patient rounds today.

## 2015-11-19 NOTE — Progress Notes (Signed)
OT Cancellation Note  Patient Details Name: Norva Parga MRN: [id number] DOB: 06-19-1941   Cancelled Treatment:    Reason Eval/Treat Not Completed: Medical issues which prohibited therapy (Pt. at Dialysis)   Harrel Carina, MS, OTR/L   Harrel Carina 11/19/2015, 3:09 PM

## 2015-11-19 NOTE — Plan of Care (Signed)
Problem: Pain Managment: Goal: General experience of comfort will improve Outcome: Progressing No complaints of pain this shift   

## 2015-11-19 NOTE — Plan of Care (Signed)
Problem: Phase II Progression Outcomes Goal: Urinary output increased Outcome: Not Progressing Patient urine output decreased, nephrology aware. Dialysis treatment initiated today after permcath placement and 1L removed per dialysis RN.

## 2015-11-19 NOTE — Progress Notes (Signed)
PT Cancellation Note  Patient Details Name: Lisa Lowery MRN: [id number] DOB: [birth year]   Cancelled Treatment:    Reason Eval/Treat Not Completed: Patient at procedure or test/unavailable  Pt out of room having permacath placed then will be at dialysis.  PT f/u if appropriate and as time allows.    Kreg Shropshire 11/19/2015, 11:56 AM

## 2015-11-19 NOTE — Consult Note (Signed)
Lisa Lowery  MRN : [id number]  Lisa Lowery is a 75 y.o. ([birth year]) female who presents with chief complaint of  Chief Complaint  Patient presents with  . Leg Swelling  .  History of Present Illness: I am asked to evaluate the patient for dialysis access by Dr. Candiss Norse. The patient is a 75 y.o. female with history of hypertension, diabetes mellitus type 2, atrial fibrillation and non-STEMI on recent admission in January 2017 presents with the complaints of generalized weakness with inability to move around which is progressive for the past few months since her discharge in February. She also is experiencing increasing leg swelling since then. Chronic shortness of breath present, denies any chest pain, palpitations, dizziness, focal weakness or numbness. Denies any recent fever, cough, nausea, vomiting, diarrhea, dysuria. Patient was supposed to take eliquis and Cardizem CD since his discharge, but was not taking because of non-affordability of medications. She is also not taking other medications on a regular basis. She was supposed to get established for primary care which she did not since her last discharge. On this admission she is noted to be an acute on chronic renal failure. In discussion with Dr. Candiss Norse she will require dialysis most likely permanently and therefore she requires access.   Current Facility-Administered Medications  Medication Dose Route Frequency Provider Last Rate Last Dose  . 0.9 %  sodium chloride infusion   Intravenous Continuous Katha Cabal, MD 20 mL/hr at 11/19/15 1130    . acetaminophen (TYLENOL) tablet 650 mg  650 mg Oral Q6H PRN Sylvan Cheese, MD   650 mg at 11/12/15 0051  . albumin human 25 % solution 12.5 g  12.5 g Intravenous Once Murlean Iba, MD      . aspirin EC tablet 81 mg  81 mg Oral Daily Juluis Mire, MD   Stopped at 11/19/15 1056  . feeding supplement (NEPRO CARB STEADY) liquid 237 mL  237 mL  Oral BID BM Murlean Iba, MD   Stopped at 11/19/15 1056  . heparin injection 5,000 Units  5,000 Units Subcutaneous 3 times per day Hillary Bow, MD   5,000 Units at 11/18/15 2155  . metoprolol tartrate (LOPRESSOR) tablet 25 mg  25 mg Oral BID Hillary Bow, MD   Stopped at 11/19/15 1057  . multivitamin with minerals tablet 1 tablet  1 tablet Oral Daily Hillary Bow, MD   Stopped at 11/19/15 1057  . ondansetron (ZOFRAN) injection 4 mg  4 mg Intravenous Q6H PRN Hillary Bow, MD      . patiromer Daryll Drown) packet 8.4 g  8.4 g Oral Daily Murlean Iba, MD   Stopped at 11/19/15 1057  . polyethylene glycol (MIRALAX / GLYCOLAX) packet 17 g  17 g Oral Daily PRN Hillary Bow, MD      . senna-docusate (Senokot-S) tablet 1 tablet  1 tablet Oral BID Hillary Bow, MD   Stopped at 11/19/15 1057  . sodium chloride flush (NS) 0.9 % injection 3 mL  3 mL Intravenous Q12H Juluis Mire, MD   Stopped at 11/19/15 1057    Past Medical History  Diagnosis Date  . Vertigo   . Hypertension   . Diabetes ([place])   . A-fib [facility])     Past Surgical History  Procedure Laterality Date  . Small intestine surgery    . Abdominal hysterectomy      Social History Social History  Substance Use Topics  . Smoking status: Never Smoker   .  Smokeless tobacco: Never Used  . Alcohol Use: No    Family History Family History  Problem Relation Age of Onset  . Cancer Mother   . Cancer Father   . Diabetes Sister    no family history of porphyria, autoimmune disease or bleeding clotting disorders  No Known Allergies   REVIEW OF SYSTEMS (Negative unless checked)  Constitutional: [] Weight loss  [] Fever  [] Chills Cardiac: [] Chest pain   [] Chest pressure   [] Palpitations   [] Shortness of breath when laying flat   [x] Shortness of breath at rest   [x] Shortness of breath with exertion. Vascular:  [x] Pain in legs with walking   [] Pain in legs at rest   [] Pain in legs when laying flat   [] Claudication   [] Pain in feet  when walking  [] Pain in feet at rest  [] Pain in feet when laying flat   [] History of DVT   [] Phlebitis   [x] Swelling in legs   [] Varicose veins   [] Non-healing ulcers Pulmonary:   [] Uses home oxygen   [] Productive cough   [] Hemoptysis   [] Wheeze  [] COPD   [] Asthma Neurologic:  [x] Dizziness  [] Blackouts   [] Seizures   [] History of stroke   [] History of TIA  [] Aphasia   [] Temporary blindness   [] Dysphagia   [] Weakness or numbness in arms   [] Weakness or numbness in legs Musculoskeletal:  [] Arthritis   [] Joint swelling   [] Joint pain   [] Low back pain Hematologic:  [] Easy bruising  [] Easy bleeding   [] Hypercoagulable state   [] Anemic  [] Hepatitis Gastrointestinal:  [] Blood in stool   [] Vomiting blood  [] Gastroesophageal reflux/heartburn   [] Difficulty swallowing. Genitourinary:  [] Chronic kidney disease   [] Difficult urination  [] Frequent urination  [] Burning with urination   [] Blood in urine Skin:  [] Rashes   [] Ulcers   [] Wounds Psychological:  [] History of anxiety   []  History of major depression.    Physical Examination  Filed Vitals:   11/19/15 1305 11/19/15 1310 11/19/15 1330 11/19/15 1400  BP: 109/65 116/71 115/67 107/54  Pulse: 83 90 87 83  Temp: 97.7 F (36.5 C)     TempSrc: Axillary     Resp: 14 11 12 10   Height:      Weight: 107.6 kg (237 lb 3.4 oz)     SpO2: 94% 97% 97% 97%   Body mass index is 37.14 kg/(m^2).  Head: [place]/AT, No temporalis wasting. Prominent temp pulse not noted. Ear/Nose/Throat: Nares w/o erythema or drainage, oropharynx w/o obsrtuction. Eyes: PERRLA, Sclera nonicteric.  Neck: Supple, no nuchal rigidity.  No bruit or JVD.  Pulmonary:  Breath sounds equal bilaterally, no use of accessory muscles.  Cardiac: RRR, normal S1, S2, no Murmurs, rubs or gallops. Vascular: Upper extremities and chest wall as well as neck clean dry no open wounds or sores radial pulses are 2+. Gastrointestinal: soft, non-tender, non-distended.  Musculoskeletal: Moves all extremities.   No deformity or atrophy. No edema. Neurologic: CN 2-12 intact. Symmetrical.  Speech is fluent.  Psychiatric: Judgment intact, Mood & affect appropriate for pt's clinical situation. Dermatologic: No rashes or ulcers noted.  No cellulitis or open wounds. Lymph : No Cervical,  or Inguinal lymphadenopathy.      CBC Lab Results  Component Value Date   WBC 8.7 11/19/2015   HGB 11.6* 11/19/2015   HCT 34.5* 11/19/2015   MCV 89.3 11/19/2015   PLT 214 11/19/2015    BMET    Component Value Date/Time   NA 135 11/19/2015 0633   K 4.4 11/19/2015 [id number]  CL 107 11/19/2015 0633   CO2 23 11/19/2015 0633   GLUCOSE 98 11/19/2015 0633   BUN 92* 11/19/2015 0633   CREATININE 6.82* 11/19/2015 0633   CALCIUM 7.5* 11/19/2015 0633   GFRNONAA 5* 11/19/2015 0633   GFRAA 6* 11/19/2015 [id number]   Estimated Creatinine Clearance: 8.9 mL/min (by C-G formula based on Cr of 6.82).  COAG No results found for: INR, PROTIME  Assessment/Plan 1.  End-stage renal disease requiring hemodialysis:  Patient will begin dialysis therapy without further interruption, in discussion with Dr. Candiss Norse she is likely to require permanent dialysis and therefore rather than place a temporary catheter and convert this to a tunneled catheter there is opportunity to place the tunneled catheter initially. Risk and benefits of been reviewed with the patient all questions answered patient has agreed to proceed. 2.  Atrial fibrillation: Patient will continue on her home medications, she is on telemetry, medicine is following 3.  Hypertension:  Patient will continue medical management; nephrology is following no changes in oral medications. 4. Diabetes mellitus:  Glucose will be monitored and oral medications been held this morning once the patient has undergone the patient's procedure po intake will be reinitiated and again Accu-Cheks will be used to assess the blood glucose level and treat as needed. The patient will be restarted on the  patient's usual hypoglycemic regime 5.  Coronary artery disease:  EKG will be monitored. Nitrates will be used if needed. The patient's oral cardiac medications will be continued.     Lisa Lowery, Dolores Lory, MD  11/19/2015 2:22 PM

## 2015-11-19 NOTE — Care Management (Signed)
Received her first dialysis treatment today.  CSW has initiated a bed search. At present, it is not documented that patient has esrd.

## 2015-11-20 DIAGNOSIS — E440 Moderate protein-calorie malnutrition: Secondary | ICD-10-CM | POA: Diagnosis present

## 2015-11-20 LAB — CBC
HEMATOCRIT: 32.3 % — AB (ref 35.0–47.0)
Hemoglobin: 10.8 g/dL — ABNORMAL LOW (ref 12.0–16.0)
MCH: 29.9 pg (ref 26.0–34.0)
MCHC: 33.4 g/dL (ref 32.0–36.0)
MCV: 89.6 fL (ref 80.0–100.0)
Platelets: 153 10*3/uL (ref 150–440)
RBC: 3.6 MIL/uL — ABNORMAL LOW (ref 3.80–5.20)
RDW: 14.2 % (ref 11.5–14.5)
WBC: 7.3 10*3/uL (ref 3.6–11.0)

## 2015-11-20 LAB — HEPATITIS B SURFACE ANTIBODY,QUALITATIVE: HEP B S AB: NONREACTIVE

## 2015-11-20 LAB — GLUCOSE, CAPILLARY
GLUCOSE-CAPILLARY: 124 mg/dL — AB (ref 65–99)
GLUCOSE-CAPILLARY: 119 mg/dL — AB (ref 65–99)
Glucose-Capillary: 95 mg/dL (ref 65–99)
Glucose-Capillary: 121 mg/dL — ABNORMAL HIGH (ref 65–99)

## 2015-11-20 LAB — RENAL FUNCTION PANEL
Albumin: 1.8 g/dL — ABNORMAL LOW (ref 3.5–5.0)
Anion gap: 7 (ref 5–15)
BUN: 81 mg/dL — AB (ref 6–20)
CHLORIDE: 105 mmol/L (ref 101–111)
CO2: 24 mmol/L (ref 22–32)
Calcium: 7.4 mg/dL — ABNORMAL LOW (ref 8.9–10.3)
Creatinine, Ser: 6.34 mg/dL — ABNORMAL HIGH (ref 0.44–1.00)
GFR calc Af Amer: 7 mL/min — ABNORMAL LOW (ref >60)
GFR, EST NON AFRICAN AMERICAN: 6 mL/min — AB (ref >60)
GLUCOSE: 148 mg/dL — AB (ref 65–99)
POTASSIUM: 4 mmol/L (ref 3.5–5.1)
Phosphorus: 6.8 mg/dL — ABNORMAL HIGH (ref 2.5–4.6)
Sodium: 136 mmol/L (ref 135–145)

## 2015-11-20 LAB — C3 COMPLEMENT: C3 Complement: 111 mg/dL (ref 82–167)

## 2015-11-20 LAB — HEPATITIS C ANTIBODY: HCV Ab: 0.1 s/co ratio (ref 0.0–0.9)

## 2015-11-20 LAB — C4 COMPLEMENT: Complement C4, Body Fluid: 24 mg/dL (ref 14–44)

## 2015-11-20 LAB — HEPATITIS B CORE ANTIBODY, TOTAL: HEP B C TOTAL AB: NEGATIVE

## 2015-11-20 LAB — HEPATITIS B SURFACE ANTIGEN: Hepatitis B Surface Ag: NEGATIVE

## 2015-11-20 NOTE — Progress Notes (Signed)
[facility]  ROUNDING NOTE   Subjective:  Patient seen and evaluated during HD. Tolearting well. UF target 2kg.     HEMODIALYSIS FLOWSHEET:  Blood Flow Rate (mL/min): 250 mL/min Arterial Pressure (mmHg): -80 mmHg Venous Pressure (mmHg): 120 mmHg Transmembrane Pressure (mmHg): 10 mmHg Ultrafiltration Rate (mL/min): 1000 mL/min Dialysate Flow Rate (mL/min): 500 ml/min Conductivity: Machine : 14 Conductivity: Machine : 14 Dialysis Fluid Bolus: Normal Saline Bolus Amount (mL): 250 mL Intra-Hemodialysis Comments: 1957m (resting with eyes open)    Objective:  Vital signs in last 24 hours:  Temp:  [97.5 F (36.4 C)-98.6 F (37 C)] 98.2 F (36.8 C) (04/08 1105) Pulse Rate:  [79-98] 81 (04/08 1145) Resp:  [10-21] 13 (04/08 1145) BP: (88-116)/(46-71) 94/51 mmHg (04/08 1145) SpO2:  [96 %-98 %] 96 % (04/08 0900) Weight:  [103.7 kg (228 lb 9.9 oz)-106.6 kg (235 lb 0.2 oz)] 103.7 kg (228 lb 9.9 oz) (04/08 1105)  Weight change: 3.454 kg (7 lb 9.8 oz) Filed Weights   11/19/15 1305 11/19/15 1529 11/20/15 1105  Weight: 107.6 kg (237 lb 3.4 oz) 106.6 kg (235 lb 0.2 oz) 103.7 kg (228 lb 9.9 oz)    Intake/Output: I/O last 3 completed shifts: In: 227[P.O.:292] Out: 1500 [Urine:500; Other:1000]   Intake/Output this shift:  Total I/O In: 240 [P.O.:240] Out: 0   Physical Exam: General: NAD, chronically ill appearing, laying in bed  Head: Normocephalic, atraumatic. Dry oral mucosal membranes  Eyes: Anicteric  Neck: Supple, trachea midline  Lungs:  Clear ant/lat, normal effort  Heart: Regular rate and rhythm  Abdomen:  Soft, nontender, BS present  Extremities: ++ peripheral edema all 4 extremities/Anasarca  Neurologic: Nonfocal, moving all four extremities, alert, following commands  Skin: No acute lesions       Basic Metabolic Panel:  Recent Labs Lab 11/14/15 0510  11/16/15 0539 11/17/15 0415 11/18/15 0418 11/19/15 0633 11/20/15 1119  NA 134*  135   < > 134* 136 137 135 136  K 5.1  5.1  < > 5.8* 5.8* 4.7 4.4 4.0  CL 108  107  < > 105 108 106 107 105  CO2 21*  21*  < > _0 GLUCOSE 127*  129*  < > 103* 122* 114* 98 148*  BUN 60*  60*  < > 74* 82* 86* 92* 81*  CREATININE 4.22*  4.02*  < > 5.43* 6.14* 6.51* 6.82* 6.34*  CALCIUM 7.9*  7.9*  < > 8.1* 7.8* 7.7* 7.5* 7.4*  PHOS 5.1*  --   --   --   --  7.6* 6.8*  < > = values in this interval not displayed.  Liver Function Tests:  Recent Labs Lab 11/14/15 0510 11/15/15 0420 11/17/15 0415 11/19/15 0633 11/20/15 1119  AST  --  46* 35  --   --   ALT  --  38 32  --   --   ALKPHOS  --  64 65  --   --   BILITOT  --  0.5 0.3  --   --   PROT  --  7.1 7.1  --   --   ALBUMIN 2.2* 1.9* 1.7* 1.5* 1.8*   No results for input(s): LIPASE, AMYLASE in the last 168 hours. No results for input(s): AMMONIA in the last 168 hours.  CBC:  Recent Labs Lab 11/16/15 0539 11/19/15 0632 11/20/15 1119  WBC 9.2 8.7 7.3  NEUTROABS 7.2*  --   --   HGB 12.1  11.6* 10.8*  HCT 36.7 34.5* 32.3*  MCV 90.0 89.3 89.6  PLT 229 214 153    Cardiac Enzymes: No results for input(s): CKTOTAL, CKMB, CKMBINDEX, TROPONINI in the last 168 hours.  BNP: Invalid input(s): POCBNP  CBG:  Recent Labs Lab 11/18/15 2108 11/19/15 0737 11/19/15 1610 11/19/15 2047 11/20/15 0740  GLUCAP 119* 97 72 129* 124*    Microbiology: Results for orders placed or performed during the hospital encounter of 11/10/15  Urine culture     Status: None   Collection Time: 11/11/15  3:44 AM  Result Value Ref Range Status   Specimen Description URINE, RANDOM  Final   Special Requests NONE  Final   Culture MULTIPLE SPECIES PRESENT, SUGGEST RECOLLECTION  Final   Report Status 11/13/2015 FINAL  Final  Culture, body fluid-bottle     Status: None   Collection Time: 11/11/15 11:45 AM  Result Value Ref Range Status   Specimen Description PLEURAL  Final   Special Requests NONE  Final   Gram Stain FEW WBC SEEN NO  ORGANISMS SEEN   Final   Culture NO GROWTH 5 DAYS  Final   Report Status 11/16/2015 FINAL  Final    Coagulation Studies: No results for input(s): LABPROT, INR in the last 72 hours.  Urinalysis:  Recent Labs  11/19/15 1610  COLORURINE AMBER*  LABSPEC 1.018  PHURINE 5.0  GLUCOSEU NEGATIVE  HGBUR 2+*  BILIRUBINUR NEGATIVE  KETONESUR NEGATIVE  PROTEINUR >500*  NITRITE NEGATIVE  LEUKOCYTESUR 3+*      Imaging: No results found.   Medications:   . sodium chloride 20 mL/hr at 11/19/15 1130   . aspirin EC  81 mg Oral Daily  . feeding supplement (NEPRO CARB STEADY)  237 mL Oral BID BM  . heparin subcutaneous  5,000 Units Subcutaneous 3 times per day  . metoprolol tartrate  12.5 mg Oral BID  . multivitamin with minerals  1 tablet Oral Daily  . senna-docusate  1 tablet Oral BID  . sodium chloride flush  3 mL Intravenous Q12H   acetaminophen, ondansetron (ZOFRAN) IV, polyethylene glycol  Assessment/ Plan:  Ms. Monty Mccarrell is a 75 y.o. white female with atrial fibrillation, diabetes mellitus type II, hypertension, vertigo, coronary artery disease, who was admitted to [facility] on 11/10/2015 with left pleural effusion. Status post left thoracentesis on 11/11/15  1. Acute renal failure on chronic kidney disease stage III with proteinuria: baseline creatinine 1.25 eGFR 41 from 09/11/15. Acute renal failure from acute cardiorenal syndrome. Recent thoracentesis (left) on 3/30 where 1.7 L of fluid was removed. Patient with multiple episodes of hypotension on 3/31, likely resulting in ATN - With worsening S Cr and BUN, significant edema, recommend Dialysis to patient - patient seen and evaluated during second HD treatment, tolerating well, will reevaluate for third HD treatment tomorrow.   2. Hypertension: with atrial fibrillation.  Echo from 1/17 reviewed, diastolic dysfunction. With anasarca on examination -   Continue metoprolol.  3. Diabetes mellitus type II Insulin dependent with  chronic kidney disease: hemoglobin A1c from 09/11/15 5.1% - Continue glucose control per hospitalist.  4. Hyperkalemia - K normalized with HD, no further need for veltessa at the moment.  5. Generalized edema/Anasarca and Left pleural effusion: - status post ultrasound guided thoracentesis on 3/30 with 1.7 litres removed.  - UF target 2kg tomorrow.   6. Proteinuria - Albumin 1.5 - urine P/C 5.0.  - screening serologies      LOS: 10 Emireth Cockerham 4/8/20171:08 PM

## 2015-11-20 NOTE — Progress Notes (Signed)
[place] at [place] NAME: Lisa Lowery    MR#:  [id number]  DATE OF BIRTH:  1941/06/26  SUBJECTIVE;Finished first HD session today,tolerated well.  CHIEF COMPLAINT:   Chief Complaint  Patient presents with  . Leg Swelling   Admitted for shortness of breath and lower extremity swelling and found to be in atrial fibrillation with rapid ventricular rate. Left thoracentesis done and 1.7 L fluid removed on 11/11/2015.  Patient seen  For shortness of breath and weakness with renal failure.  Poor appetite. Had hemodialysis yesterday.  REVIEW OF SYSTEMS:    Review of Systems  Constitutional: Positive for malaise/fatigue. Negative for fever and chills.  HENT: Negative for sore throat.   Eyes: Negative for blurred vision, double vision and pain.  Respiratory: Negative for cough, hemoptysis, shortness of breath and wheezing.   Cardiovascular: Positive for orthopnea and leg swelling. Negative for chest pain and palpitations.  Gastrointestinal: Negative for heartburn, nausea, vomiting, abdominal pain, diarrhea and constipation.  Genitourinary: Negative for dysuria and hematuria.  Musculoskeletal: Negative for back pain and joint pain.  Skin: Negative for rash.  Neurological: Positive for weakness. Negative for dizziness, sensory change, speech change, focal weakness and headaches.  Endo/Heme/Allergies: Does not bruise/bleed easily.  Psychiatric/Behavioral: Negative for depression. The patient is not nervous/anxious.     DRUG ALLERGIES:  No Known Allergies  VITALS:  Blood pressure 115/57, pulse 98, temperature 98.2 F (36.8 C), temperature source Oral, resp. rate 20, height 5\' 7"  (1.702 m), weight 103.7 kg (228 lb 9.9 oz), SpO2 96 %.  PHYSICAL EXAMINATION:   Physical Exam  GENERAL:  75 y.o.-year-old patient lying in the bed. Anasarca. EYES: Pupils equal, round, reactive to light and accommodation. No scleral icterus. Extraocular  muscles intact.  HEENT: Head atraumatic, normocephalic. Oropharynx and nasopharynx clear.  NECK:  Supple, no jugular venous distention. No thyroid enlargement, no tenderness.  LUNGS: decreased breath sounds bilateraly CARDIOVASCULAR: S1, S2 normal. No murmurs, rubs, or gallops. Irregular ABDOMEN: Soft, nontender, nondistended. Bowel sounds present. No organomegaly or mass.  EXTREMITIES: No cyanosis, clubbing. 2+ lower extremity edema NEUROLOGIC: Cranial nerves II through XII are intact. No focal Motor or sensory deficits b/l.   PSYCHIATRIC: The patient is alert and oriented x 3. Flat affect. SKIN: No obvious rash, lesion, or ulcer.   LABORATORY PANEL:   CBC  Recent Labs Lab 11/20/15 1119  WBC 7.3  HGB 10.8*  HCT 32.3*  PLT 153   ------------------------------------------------------------------------------------------------------------------ Chemistries   Recent Labs Lab 11/17/15 0415  11/20/15 1119  NA 136  < > 136  K 5.8*  < > 4.0  CL 108  < > 105  CO2 23  < > 24  GLUCOSE 122*  < > 148*  BUN 82*  < > 81*  CREATININE 6.14*  < > 6.34*  CALCIUM 7.8*  < > 7.4*  AST 35  --   --   ALT 32  --   --   ALKPHOS 65  --   --   BILITOT 0.3  --   --   < > = values in this interval not displayed. ------------------------------------------------------------------------------------------------------------------  Cardiac Enzymes No results for input(s): TROPONINI in the last 168 hours. ------------------------------------------------------------------------------------------------------------------  RADIOLOGY:  No results found.   ASSESSMENT AND PLAN:  75 year old female patient with history of diastolic CHF, chronic troponin elevation, atrial fibrillation and noncompliance presents to the hospital with shortness of breath and lower extremity swelling.  * Acute renal failure due  to cardiorenal syndrome with hyperkalemia Lasix stopped due to worsening renal function. Started  on hemodialysis. PermCath in place  * Bilateral pleural effusions left greater than right due to congestive heart failure and hypoalbuminemia;pt started on IV albumin Status post left thoracentesis with 1.7 L.  Transudate.  * Acute on chronic diastolic congestive heart failure with acute respiratory failure - worsening - Stopped Lasix due to worsening renal function Now on hemodialysis - Input and Output Counseled to limit fluids and Salt,continue HD,  - * A. Fib with RVR On metoprolol  * UTI - Cx multiple organisms Stopped IV ceftriaxone  * Hypertension,now bp  is running low, Adjusted metoprolol  * Ileus Resolved with Dulcolax suppository. Repeat x-ray was normal  * Diabetes mellitus Sliding scale insulin  * Chronic mild elevation of troponin is stable  *Hyperkalemia - resolved Was on Valtessa. Stop now that she is on dialysis  All the records are reviewed and case discussed with Care Management/Social Workerr. Management plans discussed with the patient and in agreement.  Patient needs skilled nursing facility at discharge.  CODE STATUS: FULL CODE  DVT Prophylaxis: SCDs  TOTAL TIME TAKING CARE OF THIS PATIENT:30  min  Hillary Bow R M.D on 11/20/2015 at 12:05 PM  Between 7am to 6pm - Pager - [phone]  After 6pm go to [email or web address] - password [email or web address] [facility]  [place] [facility]  Office  [phone]  CC: Primary care physician; No PCP Per Patient  Note: This dictation was prepared with Dragon dictation along with smaller phrase technology. Any transcriptional errors that result from this process are unintentional.

## 2015-11-20 NOTE — Progress Notes (Signed)
Nutrition Follow-up  DOCUMENTATION CODES:   Non-severe (moderate) malnutrition in context of acute illness/injury  INTERVENTION:   Medical Food Supplement Therapy: continue Nepro Shake po BID between meals, each supplement provides 425 kcal and 19 grams protein. Will Acupuncturist Cup at [employer] and [employer].  Brief Education: discussed importance of protein intake with initiation of HD, discussed sources of protein, encouraged protein supplements at this time; did not provide full education on dietary guidelines for adults starting on HD at time of visit. Will provide on follow-up; pt will also receive ongoing education at outpatient dialysis by RD at dialysis center  NUTRITION DIAGNOSIS:   Malnutrition related to acute illness as evidenced by estimated needs, energy intake < 75% for > 7 days, moderate to severe fluid accumulation, mild depletion of body fat, mild depletion of muscle mass, moderate depletions of muscle mass.  GOAL:   Patient will meet greater than or equal to 90% of their needs  MONITOR:   PO intake, Labs, Weight trends  REASON FOR ASSESSMENT:   Consult Assessment of nutrition requirement/status  ASSESSMENT:   Permcath placed placed and pt received 1st HD treatment on 11/19/15; 1 L fluid removed with HD yesterday  Pt to receive 2nd HD treatment today; pt reports she is feeling better today, appetite improving, able to lift leg which she reports she has not been able to do since admission  Diet Order:  Diet renal/carb modified with fluid restriction Diet-HS Snack?: Nothing; Room service appropriate?: Yes; Fluid consistency:: Thin; Fluid restriction:: 1200 mL Fluid  Energy Intake: recorded po intake 61% of meals since admission per I/O flow sheet; recorded po intake 100% of breakfast tray this AM, although pt reports all she had was toast on her meal tray this AM. Pt sipping on Nepro on visit today; when asked pt if she likes the Nepro, pt states she likes anything  with good nutrition.   Skin:  Reviewed, no issues  Last BM:  11/18/15  Nutrition Focused Physical Exam: .Nutrition-Focused physical exam completed. Findings are mild fat depletion in orbital region only,  Mild  muscle depletion in upper regions and moderate wasting in temporal region, and moderate edema. Extremities difficult to assess due to edema  Height:   Ht Readings from Last 1 Encounters:  11/10/15 5\' 7"  (1.702 m)    Weight:   Wt Readings from Last 1 Encounters:  11/19/15 235 lb 0.2 oz (106.6 kg)    Filed Weights   11/19/15 0417 11/19/15 1305 11/19/15 1529  Weight: 229 lb 9.6 oz (104.146 kg) 237 lb 3.4 oz (107.6 kg) 235 lb 0.2 oz (106.6 kg)    BMI:  Body mass index is 36.8 kg/(m^2).  Estimated Nutritional Needs:   Kcal:  2160-2520 kcals using SBW for height  Protein:  86-108 using SBW forheight  Fluid:  1 L plus UOP  EDUCATION NEEDS:   Education needs addressed  Kerman Passey Malcolm, [place], LDN [phone] Pager  [phone] Weekend/On-Call Pager

## 2015-11-21 LAB — GLUCOSE, CAPILLARY
GLUCOSE-CAPILLARY: 91 mg/dL (ref 65–99)
GLUCOSE-CAPILLARY: 143 mg/dL — AB (ref 65–99)
Glucose-Capillary: 123 mg/dL — ABNORMAL HIGH (ref 65–99)
Glucose-Capillary: 72 mg/dL (ref 65–99)

## 2015-11-21 NOTE — Progress Notes (Signed)
Pt. Arrived from dialysis in chair, pt. Weighed, VS taken and meal given. No complaints at this time. Will continue to monitor pt.

## 2015-11-21 NOTE — Progress Notes (Signed)
[facility] NAME: Lisa Lowery    MR#:  [id number]  DATE OF BIRTH:  January 30, 1941  SUBJECTIVE;Finished first HD session today,tolerated well.  CHIEF COMPLAINT:   Chief Complaint  Patient presents with  . Leg Swelling   Admitted for shortness of breath and lower extremity swelling and found to be in atrial fibrillation with rapid ventricular rate. Left thoracentesis done and 1.7 L fluid removed on 11/11/2015.  Patient seen  For shortness of breath and weakness with renal failure.  Appetite improving. Patient tolerating dialysis well. He'll feels weak. Some shortness of breath. No cough. Afebrile.  REVIEW OF SYSTEMS:    Review of Systems  Constitutional: Positive for malaise/fatigue. Negative for fever and chills.  HENT: Negative for sore throat.   Eyes: Negative for blurred vision, double vision and pain.  Respiratory: Negative for cough, hemoptysis, shortness of breath and wheezing.   Cardiovascular: Positive for orthopnea and leg swelling. Negative for chest pain and palpitations.  Gastrointestinal: Negative for heartburn, nausea, vomiting, abdominal pain, diarrhea and constipation.  Genitourinary: Negative for dysuria and hematuria.  Musculoskeletal: Negative for back pain and joint pain.  Skin: Negative for rash.  Neurological: Positive for weakness. Negative for dizziness, sensory change, speech change, focal weakness and headaches.  Endo/Heme/Allergies: Does not bruise/bleed easily.  Psychiatric/Behavioral: Negative for depression. The patient is not nervous/anxious.     DRUG ALLERGIES:  No Known Allergies  VITALS:  Blood pressure 96/49, pulse 107, temperature 97.5 F (36.4 C), temperature source Oral, resp. rate 16, height 5\' 7"  (1.702 m), weight 103.41 kg (227 lb 15.6 oz), SpO2 93 %.  PHYSICAL EXAMINATION:   Physical Exam  GENERAL:  75 y.o.-year-old patient lying in the bed. Anasarca. EYES: Pupils equal,  round, reactive to light and accommodation. No scleral icterus. Extraocular muscles intact.  HEENT: Head atraumatic, normocephalic. Oropharynx and nasopharynx clear.  NECK:  Supple, no jugular venous distention. No thyroid enlargement, no tenderness.  LUNGS: decreased breath sounds bilateraly CARDIOVASCULAR: S1, S2 normal. No murmurs, rubs, or gallops. Irregular ABDOMEN: Soft, nontender, nondistended. Bowel sounds present. No organomegaly or mass.  EXTREMITIES: No cyanosis, clubbing. 2+ lower extremity edema NEUROLOGIC: Cranial nerves II through XII are intact. No focal Motor or sensory deficits b/l.   PSYCHIATRIC: The patient is alert and oriented x 3. SKIN: No obvious rash, lesion, or ulcer.   LABORATORY PANEL:   CBC  Recent Labs Lab 11/20/15 1119  WBC 7.3  HGB 10.8*  HCT 32.3*  PLT 153   ------------------------------------------------------------------------------------------------------------------ Chemistries   Recent Labs Lab 11/17/15 0415  11/20/15 1119  NA 136  < > 136  K 5.8*  < > 4.0  CL 108  < > 105  CO2 23  < > 24  GLUCOSE 122*  < > 148*  BUN 82*  < > 81*  CREATININE 6.14*  < > 6.34*  CALCIUM 7.8*  < > 7.4*  AST 35  --   --   ALT 32  --   --   ALKPHOS 65  --   --   BILITOT 0.3  --   --   < > = values in this interval not displayed. ------------------------------------------------------------------------------------------------------------------  Cardiac Enzymes No results for input(s): TROPONINI in the last 168 hours. ------------------------------------------------------------------------------------------------------------------  RADIOLOGY:  No results found.   ASSESSMENT AND PLAN:  75 year old female patient with history of diastolic CHF, chronic troponin elevation, atrial fibrillation and noncompliance presents to the hospital with shortness of breath and  lower extremity swelling.  * Acute renal failure due to cardiorenal syndrome with  hyperkalemia Lasix stopped due to worsening renal function. Started on hemodialysis. PermCath in place Tolerating dialysis well  * Bilateral pleural effusions left greater than right due to congestive heart failure and hypoalbuminemia;pt started on IV albumin Status post left thoracentesis with 1.7 L.  Transudate.  * Acute on chronic diastolic congestive heart failure with acute respiratory failure - worsening - Stopped Lasix due to worsening renal function - Now on hemodialysis - Input and Output  - * A. Fib with RVR On metoprolol  * UTI - Cx multiple organisms Finished antibiotics.  * Ileus Resolved with Dulcolax suppository. Repeat x-ray was normal  * Diabetes mellitus Sliding scale insulin  * Chronic mild elevation of troponin is stable  *Hyperkalemia - resolved  All the records are reviewed and case discussed with Care Management/Social Workerr. Management plans discussed with the patient and in agreement.  Patient needs skilled nursing facility at discharge.  CODE STATUS: FULL CODE  DVT Prophylaxis: SCDs  TOTAL TIME TAKING CARE OF THIS PATIENT:30  min  Hillary Bow R M.D on 11/21/2015 at 12:57 PM  Between 7am to 6pm - Pager - [phone]  After 6pm go to [email or web address] - password [email or web address] [facility]  [place] [facility]  Office  [phone]  CC: Primary care physician; No PCP Per Patient  Note: This dictation was prepared with Dragon dictation along with smaller phrase technology. Any transcriptional errors that result from this process are unintentional.

## 2015-11-21 NOTE — Progress Notes (Signed)
[facility]  ROUNDING NOTE   Subjective:  Patient had hemodialysis yesterday. We have decided upon third treatment today. She states that she is feeling somewhat better after initiation of dialysis.   Objective:  Vital signs in last 24 hours:  Temp:  [97.5 F (36.4 C)-98 F (36.7 C)] 97.5 F (36.4 C) (04/09 1123) Pulse Rate:  [88-107] 107 (04/09 1123) Resp:  [16-18] 16 (04/09 1123) BP: (96-133)/(49-73) 96/49 mmHg (04/09 1123) SpO2:  [93 %-99 %] 93 % (04/09 1123) Weight:  [103.41 kg (227 lb 15.6 oz)] 103.41 kg (227 lb 15.6 oz) (04/09 0439)  Weight change: -3.9 kg (-8 lb 9.6 oz) Filed Weights   11/19/15 1529 11/20/15 1105 11/21/15 0439  Weight: 106.6 kg (235 lb 0.2 oz) 103.7 kg (228 lb 9.9 oz) 103.41 kg (227 lb 15.6 oz)    Intake/Output: I/O last 3 completed shifts: In: 74 [P.O.:532] Out: 2000 [Other:2000]   Intake/Output this shift:     Physical Exam: General: NAD, chronically ill appearing, laying in bed  Head: Normocephalic, atraumatic. Dry oral mucosal membranes  Eyes: Anicteric  Neck: Supple, trachea midline  Lungs:  Clear ant/lat, normal effort  Heart: Regular rate and rhythm  Abdomen:  Soft, nontender, BS present  Extremities: + peripheral edema all 4 extremities/Anasarca  Neurologic: Nonfocal, moving all four extremities, alert, following commands  Skin: No acute lesions       Basic Metabolic Panel:  Recent Labs Lab 11/16/15 0539 11/17/15 0415 11/18/15 0418 11/19/15 0633 11/20/15 1119  NA 134* 136 137 135 136  K 5.8* 5.8* 4.7 4.4 4.0  CL 105 108 106 107 105  CO2 23 23 23 23 24   GLUCOSE 103* 122* 114* 98 148*  BUN 74* 82* 86* 92* 81*  CREATININE 5.43* 6.14* 6.51* 6.82* 6.34*  CALCIUM 8.1* 7.8* 7.7* 7.5* 7.4*  PHOS  --   --   --  7.6* 6.8*    Liver Function Tests:  Recent Labs Lab 11/15/15 0420 11/17/15 0415 11/19/15 0633 11/20/15 1119  AST 46* 35  --   --   ALT 38 32  --   --   ALKPHOS 64 65  --   --   BILITOT 0.5 0.3   --   --   PROT 7.1 7.1  --   --   ALBUMIN 1.9* 1.7* 1.5* 1.8*   No results for input(s): LIPASE, AMYLASE in the last 168 hours. No results for input(s): AMMONIA in the last 168 hours.  CBC:  Recent Labs Lab 11/16/15 0539 11/19/15 0632 11/20/15 1119  WBC 9.2 8.7 7.3  NEUTROABS 7.2*  --   --   HGB 12.1 11.6* 10.8*  HCT 36.7 34.5* 32.3*  MCV 90.0 89.3 89.6  PLT 229 214 153    Cardiac Enzymes: No results for input(s): CKTOTAL, CKMB, CKMBINDEX, TROPONINI in the last 168 hours.  BNP: Invalid input(s): POCBNP  CBG:  Recent Labs Lab 11/20/15 1409 11/20/15 1614 11/20/15 2054 11/21/15 0734 11/21/15 1124  GLUCAP 95 121* 119* 91 143*    Microbiology: Results for orders placed or performed during the hospital encounter of 11/10/15  Urine culture     Status: None   Collection Time: 11/11/15  3:44 AM  Result Value Ref Range Status   Specimen Description URINE, RANDOM  Final   Special Requests NONE  Final   Culture MULTIPLE SPECIES PRESENT, SUGGEST RECOLLECTION  Final   Report Status 11/13/2015 FINAL  Final  Culture, body fluid-bottle     Status: None  Collection Time: 11/11/15 11:45 AM  Result Value Ref Range Status   Specimen Description PLEURAL  Final   Special Requests NONE  Final   Gram Stain FEW WBC SEEN NO ORGANISMS SEEN   Final   Culture NO GROWTH 5 DAYS  Final   Report Status 11/16/2015 FINAL  Final    Coagulation Studies: No results for input(s): LABPROT, INR in the last 72 hours.  Urinalysis:  Recent Labs  11/19/15 1610  COLORURINE AMBER*  LABSPEC 1.018  PHURINE 5.0  GLUCOSEU NEGATIVE  HGBUR 2+*  BILIRUBINUR NEGATIVE  KETONESUR NEGATIVE  PROTEINUR >500*  NITRITE NEGATIVE  LEUKOCYTESUR 3+*      Imaging: No results found.   Medications:   . sodium chloride 20 mL/hr at 11/19/15 1130   . aspirin EC  81 mg Oral Daily  . feeding supplement (NEPRO CARB STEADY)  237 mL Oral BID BM  . heparin subcutaneous  5,000 Units Subcutaneous 3  times per day  . metoprolol tartrate  12.5 mg Oral BID  . multivitamin with minerals  1 tablet Oral Daily  . senna-docusate  1 tablet Oral BID  . sodium chloride flush  3 mL Intravenous Q12H   acetaminophen, ondansetron (ZOFRAN) IV, polyethylene glycol  Assessment/ Plan:  [name] is a 75 y.o. white female with atrial fibrillation, diabetes mellitus type II, hypertension, vertigo, coronary artery disease, who was admitted to [facility] on 11/10/2015 with left pleural effusion. Status post left thoracentesis on 11/11/15  1. Acute renal failure on chronic kidney disease stage III with proteinuria: baseline creatinine 1.25 eGFR 41 from 09/11/15. Acute renal failure from acute cardiorenal syndrome. Recent thoracentesis (left) on 3/30 where 1.7 L of fluid was removed. Patient with multiple episodes of hypotension on 3/31, likely resulting in ATN - With worsening S Cr and BUN, significant edema, recommend Dialysis to patient - Patient completed second dialysis treatment today and we plan for 30 hemodialysis treatment today with ultrafiltration target of 2-2.5 kg. We will reassess for dialysis tomorrow as well.   2. Hypertension: with atrial fibrillation.  Echo from 1/17 reviewed, diastolic dysfunction. With anasarca on examination -   Blood pressure today is a bit low at 96/49. Patient currently on metoprolol.  3. Diabetes mellitus type II Insulin dependent with chronic kidney disease: hemoglobin A1c from 09/11/15 5.1% - Continue glucose control per hospitalist.  4. Hyperkalemia - Potassium down to 4.0 at last check.  5. Generalized edema/Anasarca and Left pleural effusion: - status post ultrasound guided thoracentesis on 3/30 with 1.7 litres removed.  - Has done well with dialysis and ultrafiltration. Continue ultrafiltration with dialysis today.  6. Proteinuria - Albumin 1.5 - urine P/C 5.0.  - screening serologies      LOS: [place], Lisa Lowery 4/9/20172:56 PM

## 2015-11-21 NOTE — Progress Notes (Signed)
Patient up to chair for dialysis 2 assist to get up to chair.

## 2015-11-22 ENCOUNTER — Encounter: Payer: Self-pay | Admitting: Vascular Surgery

## 2015-11-22 LAB — BASIC METABOLIC PANEL
ANION GAP: 1 — AB (ref 5–15)
BUN: 39 mg/dL — ABNORMAL HIGH (ref 6–20)
CALCIUM: 7 mg/dL — AB (ref 8.9–10.3)
CHLORIDE: 104 mmol/L (ref 101–111)
CO2: 31 mmol/L (ref 22–32)
Creatinine, Ser: 4.15 mg/dL — ABNORMAL HIGH (ref 0.44–1.00)
GFR calc non Af Amer: 10 mL/min — ABNORMAL LOW (ref >60)
GFR, EST AFRICAN AMERICAN: 11 mL/min — AB (ref >60)
Glucose, Bld: 137 mg/dL — ABNORMAL HIGH (ref 65–99)
POTASSIUM: 4 mmol/L (ref 3.5–5.1)
Sodium: 136 mmol/L (ref 135–145)

## 2015-11-22 LAB — GLUCOSE, CAPILLARY
GLUCOSE-CAPILLARY: 136 mg/dL — AB (ref 65–99)
GLUCOSE-CAPILLARY: 131 mg/dL — AB (ref 65–99)
Glucose-Capillary: 122 mg/dL — ABNORMAL HIGH (ref 65–99)

## 2015-11-22 LAB — PROTEIN ELECTROPHORESIS, SERUM
A/G RATIO SPE: 0.4 — AB (ref 0.7–1.7)
ALBUMIN ELP: 1.8 g/dL — AB (ref 2.9–4.4)
Alpha-1-Globulin: 0.2 g/dL (ref 0.0–0.4)
Alpha-2-Globulin: 0.7 g/dL (ref 0.4–1.0)
Beta Globulin: 0.6 g/dL — ABNORMAL LOW (ref 0.7–1.3)
Gamma Globulin: 3 g/dL — ABNORMAL HIGH (ref 0.4–1.8)
Globulin, Total: 4.5 g/dL — ABNORMAL HIGH (ref 2.2–3.9)
M-Spike, %: 2.8 g/dL — ABNORMAL HIGH
Total Protein ELP: 6.3 g/dL (ref 6.0–8.5)

## 2015-11-22 LAB — PROTEIN ELECTRO, RANDOM URINE
ALPHA-2-GLOBULIN, U: 2.5 %
Albumin ELP, Urine: 52.6 %
Alpha-1-Globulin, U: 1.5 %
Beta Globulin, U: 5.4 %
Gamma Globulin, U: 37.9 %
M Component, Ur: 31.1 % — ABNORMAL HIGH
TOTAL PROTEIN, URINE-UPE24: 981.5 mg/dL

## 2015-11-22 NOTE — Progress Notes (Signed)
Post hd tx 

## 2015-11-22 NOTE — Progress Notes (Signed)
PT Cancellation Note  Patient Details Name: Lisa Lowery MRN: [id number] DOB: Jan 24, 1941   Cancelled Treatment:    Reason Eval/Treat Not Completed: Patient at procedure or test/unavailable, Pt at hemodialysis; re attempt at a later time/date as schedule allows.    Charlaine Dalton, [place] 11/22/2015, 12:06 PM

## 2015-11-22 NOTE — Progress Notes (Signed)
Patient in Chair for Dialysis.

## 2015-11-22 NOTE — Progress Notes (Signed)
SATURATION QUALIFICATIONS: (This note is used to comply with regulatory documentation for home oxygen)  Patient Saturations on Room Air at Rest = 94%  Patient Saturations on Room Air while Ambulating = 91%  Patient Saturations on  Liters of oxygen while Ambulating = %  Please briefly explain why patient needs home oxygen:patient does not qualify

## 2015-11-22 NOTE — Progress Notes (Signed)
[facility]  ROUNDING NOTE   Subjective:  Patient completed hemodialysis today. Continues to have significant lower extremity edema. Outpatient dialysis placement pending.    HEMODIALYSIS FLOWSHEET:  Blood Flow Rate (mL/min): 300 mL/min Arterial Pressure (mmHg): -110 mmHg Venous Pressure (mmHg): 80 mmHg Transmembrane Pressure (mmHg): 40 mmHg Ultrafiltration Rate (mL/min): 670 mL/min Dialysate Flow Rate (mL/min): 600 ml/min Conductivity: Machine : 14.1 Conductivity: Machine : 14.1 Dialysis Fluid Bolus: Normal Saline Bolus Amount (mL): 250 mL Intra-Hemodialysis Comments: 2534m..Marland KitchenMarland Kitchenx completed.     Objective:  Vital signs in last 24 hours:  Temp:  [97.5 F (36.4 C)-97.9 F (36.6 C)] 97.5 F (36.4 C) (04/10 1349) Pulse Rate:  [93-107] 100 (04/10 1349) Resp:  [12-18] 18 (04/10 1349) BP: (86-125)/(54-72) 104/54 mmHg (04/10 1349) SpO2:  [91 %-100 %] 92 % (04/10 1349) Weight:  [100 kg (220 lb 7.4 oz)-100.018 kg (220 lb 8 oz)] 100 kg (220 lb 7.4 oz) (04/10 0950)  Weight change: -3.682 kg (-8 lb 1.9 oz) Filed Weights   11/21/15 0439 11/22/15 0425 11/22/15 0950  Weight: 103.41 kg (227 lb 15.6 oz) 100.018 kg (220 lb 8 oz) 100 kg (220 lb 7.4 oz)    Intake/Output: I/O last 3 completed shifts: In: -  Out: 800 [Other:800]   Intake/Output this shift:  Total I/O In: -  Out: 2000 [Other:2000]  Physical Exam: General: NAD, chronically ill appearing, laying in bed  Head: Normocephalic, atraumatic. moist oral mucosal membranes  Eyes: Anicteric  Neck: Supple, trachea midline  Lungs:  Clear ant/lat, normal effort  Heart: Regular rate and rhythm  Abdomen:  Soft, nontender, BS present  Extremities: + peripheral edema all 4 extremities/Anasarca  Neurologic: Nonfocal, moving all four extremities, alert, following commands  Skin: No acute lesions       Basic Metabolic Panel:  Recent Labs Lab 11/17/15 0415 11/18/15 0418 11/19/15 0633 11/20/15 1119  11/22/15 0419  NA 136 137 135 136 136  K 5.8* 4.7 4.4 4.0 4.0  CL 108 106 107 105 104  CO2 23 23 23 24 31   GLUCOSE 122* 114* 98 148* 137*  BUN 82* 86* 92* 81* 39*  CREATININE 6.14* 6.51* 6.82* 6.34* 4.15*  CALCIUM 7.8* 7.7* 7.5* 7.4* 7.0*  PHOS  --   --  7.6* 6.8*  --     Liver Function Tests:  Recent Labs Lab 11/17/15 0415 11/19/15 0633 11/20/15 1119  AST 35  --   --   ALT 32  --   --   ALKPHOS 65  --   --   BILITOT 0.3  --   --   PROT 7.1  --   --   ALBUMIN 1.7* 1.5* 1.8*   No results for input(s): LIPASE, AMYLASE in the last 168 hours. No results for input(s): AMMONIA in the last 168 hours.  CBC:  Recent Labs Lab 11/16/15 0539 11/19/15 0632 11/20/15 1119  WBC 9.2 8.7 7.3  NEUTROABS 7.2*  --   --   HGB 12.1 11.6* 10.8*  HCT 36.7 34.5* 32.3*  MCV 90.0 89.3 89.6  PLT 229 214 153    Cardiac Enzymes: No results for input(s): CKTOTAL, CKMB, CKMBINDEX, TROPONINI in the last 168 hours.  BNP: Invalid input(s): POCBNP  CBG:  Recent Labs Lab 11/21/15 0734 11/21/15 1124 11/21/15 1612 11/21/15 2129 11/22/15 0729  GLUCAP 91 143* 123* 72 122*    Microbiology: Results for orders placed or performed during the hospital encounter of 11/10/15  Urine culture     Status: None  Collection Time: 11/11/15  3:44 AM  Result Value Ref Range Status   Specimen Description URINE, RANDOM  Final   Special Requests NONE  Final   Culture MULTIPLE SPECIES PRESENT, SUGGEST RECOLLECTION  Final   Report Status 11/13/2015 FINAL  Final  Culture, body fluid-bottle     Status: None   Collection Time: 11/11/15 11:45 AM  Result Value Ref Range Status   Specimen Description PLEURAL  Final   Special Requests NONE  Final   Gram Stain FEW WBC SEEN NO ORGANISMS SEEN   Final   Culture NO GROWTH 5 DAYS  Final   Report Status 11/16/2015 FINAL  Final    Coagulation Studies: No results for input(s): LABPROT, INR in the last 72 hours.  Urinalysis: No results for input(s):  COLORURINE, LABSPEC, PHURINE, GLUCOSEU, HGBUR, BILIRUBINUR, KETONESUR, PROTEINUR, UROBILINOGEN, NITRITE, LEUKOCYTESUR in the last 72 hours.  Invalid input(s): APPERANCEUR    Imaging: No results found.   Medications:   . sodium chloride 20 mL/hr at 11/19/15 1130   . aspirin EC  81 mg Oral Daily  . feeding supplement (NEPRO CARB STEADY)  237 mL Oral BID BM  . heparin subcutaneous  5,000 Units Subcutaneous 3 times per day  . metoprolol tartrate  12.5 mg Oral BID  . multivitamin with minerals  1 tablet Oral Daily  . senna-docusate  1 tablet Oral BID  . sodium chloride flush  3 mL Intravenous Q12H   acetaminophen, ondansetron (ZOFRAN) IV, polyethylene glycol  Assessment/ Plan:  [name] is a 75 y.o. white female with atrial fibrillation, diabetes mellitus type II, hypertension, vertigo, coronary artery disease, who was admitted to [facility] on 11/10/2015 with left pleural effusion. Status post left thoracentesis on 11/11/15  1. Acute renal failure on chronic kidney disease stage III with proteinuria: baseline creatinine 1.25 eGFR 41 from 09/11/15. Acute renal failure from acute cardiorenal syndrome. Recent thoracentesis (left) on 3/30 where 1.7 L of fluid was removed. Patient with multiple episodes of hypotension on 3/31, likely resulting in ATN - With worsening S Cr and BUN, significant edema, recommended Dialysis to patient - Patient completed her dialysis treatment today.  Outpatient dialysisplacement pending.  We will plan for dialysis again tomorrow given ongoing significant edema.   2. Hypertension: with atrial fibrillation.  Echo from 1/17 reviewed, diastolic dysfunction. With anasarca on examination -   Blood pressure 104/54.  Continue metoprolol.  3. Diabetes mellitus type II Insulin dependent with chronic kidney disease: hemoglobin A1c from 09/11/15 5.1% - Continue glucose control per hospitalist.  4. Hyperkalemia - potassium stable at 4.0.  Continue to monitor.  5.  Generalized edema/Anasarca and Left pleural effusion: - status post ultrasound guided thoracentesis on 3/30 with 1.7 litres removed.  - Has done well with dialysis and ultrafiltration. Ultrafiltration achieve was 2 kg today.  6. Proteinuria - Albumin 1.5 - urine P/C 5.0.  - screening serologies      LOS: 12 Lisa Lowery 4/10/20174:12 PM

## 2015-11-22 NOTE — Progress Notes (Signed)
OT Cancellation Note  Patient Details Name: Colinda Zyla MRN: [id number] DOB: 09/04/1940   Cancelled Treatment:    Reason Eval/Treat Not Completed: Patient at procedure or test/ unavailable (Dialysis)  Harrel Carina, MS, OTR/L  Harrel Carina 11/22/2015, 11:29 AM

## 2015-11-22 NOTE — Progress Notes (Signed)
Hemodialysis start 

## 2015-11-22 NOTE — Progress Notes (Signed)
Pre-hd tx 

## 2015-11-22 NOTE — Progress Notes (Signed)
[place] [facility] [place] NAME: Lisa Lowery    MR#:  [id number]  DATE OF BIRTH:  08-09-1941  SUBJECTIVE;Finished first HD session today,tolerated well.  CHIEF COMPLAINT:   Chief Complaint  Patient presents with  . Leg Swelling   Admitted for shortness of breath and lower extremity swelling and found to be in atrial fibrillation with rapid ventricular rate. Left thoracentesis done and 1.7 L fluid removed on 11/11/2015. He shouldn't was oliguric and had to be started on dialysis.  Patient seen  For shortness of breath and weakness with renal failure.  Appetite improved. Dialysis later today. Still feels weak. Off oxygen with saturations of 96%.  REVIEW OF SYSTEMS:    Review of Systems  Constitutional: Positive for malaise/fatigue. Negative for fever and chills.  HENT: Negative for sore throat.   Eyes: Negative for blurred vision, double vision and pain.  Respiratory: Negative for cough, hemoptysis, shortness of breath and wheezing.   Cardiovascular: Positive for orthopnea and leg swelling. Negative for chest pain and palpitations.  Gastrointestinal: Negative for heartburn, nausea, vomiting, abdominal pain, diarrhea and constipation.  Genitourinary: Negative for dysuria and hematuria.  Musculoskeletal: Negative for back pain and joint pain.  Skin: Negative for rash.  Neurological: Positive for weakness. Negative for dizziness, sensory change, speech change, focal weakness and headaches.  Endo/Heme/Allergies: Does not bruise/bleed easily.  Psychiatric/Behavioral: Negative for depression. The patient is not nervous/anxious.     DRUG ALLERGIES:  No Known Allergies  VITALS:  Blood pressure 117/64, pulse 97, temperature 97.9 F (36.6 C), temperature source Oral, resp. rate 12, height 5\' 7"  (1.702 m), weight 100 kg (220 lb 7.4 oz), SpO2 100 %.  PHYSICAL EXAMINATION:   Physical Exam  GENERAL:  75 y.o.-year-old patient lying in the  bed. Anasarca. EYES: Pupils equal, round, reactive to light and accommodation. No scleral icterus. Extraocular muscles intact.  HEENT: Head atraumatic, normocephalic. Oropharynx and nasopharynx clear.  NECK:  Supple, no jugular venous distention. No thyroid enlargement, no tenderness.  LUNGS: decreased breath sounds bilateraly CARDIOVASCULAR: S1, S2 normal. No murmurs, rubs, or gallops. Irregular ABDOMEN: Soft, nontender, nondistended. Bowel sounds present. No organomegaly or mass.  EXTREMITIES: No cyanosis, clubbing. 2+ lower extremity edema NEUROLOGIC: Cranial nerves II through XII are intact. No focal Motor or sensory deficits b/l.   PSYCHIATRIC: The patient is alert and oriented x 3. SKIN: No obvious rash, lesion, or ulcer.   LABORATORY PANEL:   CBC  Recent Labs Lab 11/20/15 1119  WBC 7.3  HGB 10.8*  HCT 32.3*  PLT 153   ------------------------------------------------------------------------------------------------------------------ Chemistries   Recent Labs Lab 11/17/15 0415  11/22/15 0419  NA 136  < > 136  K 5.8*  < > 4.0  CL 108  < > 104  CO2 23  < > 31  GLUCOSE 122*  < > 137*  BUN 82*  < > 39*  CREATININE 6.14*  < > 4.15*  CALCIUM 7.8*  < > 7.0*  AST 35  --   --   ALT 32  --   --   ALKPHOS 65  --   --   BILITOT 0.3  --   --   < > = values in this interval not displayed. ------------------------------------------------------------------------------------------------------------------  Cardiac Enzymes No results for input(s): TROPONINI in the last 168 hours. ------------------------------------------------------------------------------------------------------------------  RADIOLOGY:  No results found.   ASSESSMENT AND PLAN:  75 year old female patient with history of diastolic CHF, chronic troponin elevation, atrial fibrillation and noncompliance  presents to the hospital with shortness of breath and lower extremity swelling.  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 We'll wait for nephrology input. Lasix stopped due to worsening renal function. Started on hemodialysis. PermCath in place Tolerating dialysis well Hyperkalemia resolved  * Bilateral pleural effusions left greater than right due to congestive heart failure and hypoalbuminemia;pt started on IV albumin Status post left thoracentesis with 1.7 L.  Transudate.  * Acute on chronic diastolic congestive heart failure with acute respiratory failure - Stopped Lasix due to worsening renal function - Now on hemodialysis - Input and Output Acute respiratory failure has resolved.  - * A. Fib with RVR On metoprolol  * UTI - Cx multiple organisms Finished antibiotics.  * Ileus Resolved with Dulcolax suppository. Repeat x-ray was normal  * Diabetes mellitus Sliding scale insulin  * Chronic mild elevation of troponin is stable  All the records are reviewed and case discussed with Care Management/Social Workerr. Management plans discussed with the patient and in agreement.  Patient needs skilled nursing facility at discharge.  CODE STATUS: FULL CODE  DVT Prophylaxis: SCDs  TOTAL TIME TAKING CARE OF THIS PATIENT:30  min  Hillary Bow R M.D on 11/22/2015 at 11:38 AM  Between 7am to 6pm - Pager - [phone]  After 6pm go to [email or web address] - password [email or web address] [facility]  [place] [facility]  Office  [phone]  CC: Primary care physician; No PCP Per Patient  Note: This dictation was prepared with Dragon dictation along with smaller phrase technology. Any transcriptional errors that result from this process are unintentional.

## 2015-11-22 NOTE — Care Management (Signed)
Scheduled for third dialysis treatment today.  Updated dialysis coordinator.  It is not stated that patient has esrd.  She sat for dialysis 4/9.  Anticipate third treatment today

## 2015-11-22 NOTE — Progress Notes (Signed)
Hemodialysis completed. 

## 2015-11-23 LAB — ANCA TITERS
Atypical P-ANCA titer: <1:20 {titer}
C-ANCA: <1:20 {titer}
P-ANCA: <1:20 {titer}

## 2015-11-23 LAB — GLUCOSE, CAPILLARY
GLUCOSE-CAPILLARY: 87 mg/dL (ref 65–99)
GLUCOSE-CAPILLARY: 115 mg/dL — AB (ref 65–99)
Glucose-Capillary: 156 mg/dL — ABNORMAL HIGH (ref 65–99)
Glucose-Capillary: 120 mg/dL — ABNORMAL HIGH (ref 65–99)

## 2015-11-23 LAB — ANA W/REFLEX IF POSITIVE: Anti Nuclear Antibody(ANA): NEGATIVE

## 2015-11-23 NOTE — Progress Notes (Signed)
PT Cancellation Note  Patient Details Name: Lisa Lowery MRN: [id number] DOB: October 23, 1940   Cancelled Treatment:    Reason Eval/Treat Not Completed: Fatigue/lethargy limiting ability to participate;Patient declined, no reason specified. Treatment attempted, pt refuses noting she has recently returned from dialysis and is too tired. Pt is attempting to eat a late lunch with difficulty due to fatigue. Re attempt treatment tomorrow.    Charlaine Dalton, [place] 11/23/2015, 4:04 PM

## 2015-11-23 NOTE — Progress Notes (Signed)
Tolerated dialyzing in dialysis chair for 3.5hours well.

## 2015-11-23 NOTE — Progress Notes (Signed)
Pre-hd tx 

## 2015-11-23 NOTE — Progress Notes (Signed)
[facility] [place] NAME: Lisa Lowery    MR#:  [id number]  DATE OF BIRTH:  [birth year]  SUBJECTIVE;.  Patient seen in dialysis and doing well  REVIEW OF SYSTEMS:    Review of Systems  Constitutional: Negative for fever and chills.  HENT: Negative for sore throat.   Eyes: Negative for blurred vision, double vision and pain.  Respiratory: Negative for cough, hemoptysis, shortness of breath and wheezing.   Cardiovascular: Positive for leg swelling. Negative for chest pain and palpitations.  Gastrointestinal: Negative for heartburn, nausea, vomiting, abdominal pain, diarrhea and constipation.  Genitourinary: Negative for dysuria and hematuria.  Musculoskeletal: Negative for back pain and joint pain.  Skin: Negative for rash.  Neurological: Positive for weakness. Negative for dizziness, sensory change, speech change, focal weakness and headaches.  Endo/Heme/Allergies: Does not bruise/bleed easily.  Psychiatric/Behavioral: Negative for depression. The patient is not nervous/anxious.     DRUG ALLERGIES:  No Known Allergies  VITALS:  Blood pressure 116/63, pulse 93, temperature 98 F (36.7 C), temperature source Oral, resp. rate 17, height 5' 7"  (1.702 m), weight 92.67 kg (204 lb 4.8 oz), SpO2 97 %.  PHYSICAL EXAMINATION:   Physical Exam  Constitutional: She is oriented to person, place, and time and well-developed, well-nourished, and in no distress. No distress.  HENT:  Head: Normocephalic.  Eyes: No scleral icterus.  Neck: Normal range of motion. Neck supple. No JVD present. No tracheal deviation present.  Cardiovascular: Normal rate, regular rhythm and normal heart sounds.  Exam reveals no gallop and no friction rub.   No murmur heard. Pulmonary/Chest: Effort normal and breath sounds normal. No respiratory distress. She has no wheezes. She has no rales. She exhibits no tenderness.  Abdominal: Soft. Bowel sounds are normal.  She exhibits no distension and no mass. There is no tenderness. There is no rebound and no guarding.  Musculoskeletal: Normal range of motion. She exhibits edema.  Neurological: She is alert and oriented to person, place, and time.  Skin: Skin is warm. No rash noted. No erythema.  Psychiatric: Affect and judgment normal.      LABORATORY PANEL:   CBC  Recent Labs Lab 11/20/15 1119  WBC 7.3  HGB 10.8*  HCT 32.3*  PLT 153   ------------------------------------------------------------------------------------------------------------------ Chemistries   Recent Labs Lab 11/17/15 0415  11/22/15 0419  NA 136  < > 136  K 5.8*  < > 4.0  CL 108  < > 104  CO2 23  < > 31  GLUCOSE 122*  < > 137*  BUN 82*  < > 39*  CREATININE 6.14*  < > 4.15*  CALCIUM 7.8*  < > 7.0*  AST 35  --   --   ALT 32  --   --   ALKPHOS 65  --   --   BILITOT 0.3  --   --   < > = values in this interval not displayed. ------------------------------------------------------------------------------------------------------------------  Cardiac Enzymes No results for input(s): TROPONINI in the last 168 hours. ------------------------------------------------------------------------------------------------------------------  RADIOLOGY:  No results found.   ASSESSMENT AND PLAN:  75 year old female patient with history of diastolic CHF, chronic troponin elevation, atrial fibrillation and noncompliance presents to the hospital with shortness of breath and lower extremity swelling.  1. Acute renal failure on chronic kidney disease stage III:  this isdue to cardiorenal syndrome  she has approached ESRD.  She has outpatient hemodialysis set up.  she has an M spike in serum which  is significant and could represent underlying multiple myeloma.  Consults oncology  Appreciate nephrology consult  Hyperkalemia resolved with hemodialysis. PermCath in place.   2. Bilateral pleural effusions left greater than right due  to congestive heart failure and hypoalbuminemia; Status post left thoracentesis 3/30 with 1.7 L.  Transudate.  3 Acute on chronic diastolic congestive heart failure with acute respiratory failure: Resolved with hemodialysis   4. A. Fib with RVR: Patient currently in normal sinus rhythm  Heart rate controlled on metoprolol   5 UTI urine culture showed multiple organisms and recommended re-collection She completed antibiotics.  6 Ileus Resolved with Dulcolax suppository. Repeat x-ray was normal  7 Diabetes mellitus: Continue ADA diet and Sliding scale insulin  last hemoglobin A1c in January was 5.1   8. Chronic mild elevation of troponin is stable  she was ruled out for ACS   9. M spike: Consult oncology   All the records are reviewed and case discussed with Care Management  Management plans discussed with the patient and shein agreement. D.w dr Holley Raring  Patient needs skilled nursing facility at discharge. D/c 1-2 days after ONCOLOGy evaluation She has a spot for outpatient HD  CODE STATUS: FULL CODE  DVT Prophylaxis: SCDs  TOTAL TIME TAKING CARE OF THIS PATIENT:29  min  Rea Reser M.D on 11/23/2015 at 12:39 PM  Between 7am to 6pm - Pager - [phone]  After 6pm go to [email or web address] - password [email or web address] [facility]  [facility]  Office  [phone]  CC: Primary care physician; No PCP Per Patient  Note: This dictation was prepared with Dragon dictation along with smaller phrase technology. Any transcriptional errors that result from this process are unintentional.

## 2015-11-23 NOTE — Consult Note (Signed)
Case discussed at length with Dr. Holley Raring. Given patient's worsening renal function and elevated M spike on serum and urine is concerning for underlying multiple myeloma or amyloidosis. Dr. Holley Raring discussed with the patient pursuing a bone marrow biopsy for diagnosis and the patient has refused this procedure. Orders have been canceled. Patient will follow-up in the [facility] as an outpatient in 2-3 weeks for further evaluation and re-discussion of pursuing biopsy.  Appreciate consult, call with questions.
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
## 2015-11-23 NOTE — Progress Notes (Signed)
Hemodialysis start 

## 2015-11-23 NOTE — Progress Notes (Signed)
Hemodialysis completed. 

## 2015-11-23 NOTE — Progress Notes (Signed)
Post hd tx 

## 2015-11-23 NOTE — Care Management Note (Signed)
Patient has been accepted at [facility] on MWF at 2:45.  They are expecting her tomorrow Wednesday 11/24/15.    Iran Sizer  Dialysis Coordinator  [phone]

## 2015-11-23 NOTE — Progress Notes (Signed)
Patient transferred to dialysis chair from reclining chair for dialysis.

## 2015-11-23 NOTE — Progress Notes (Signed)
[facility]  ROUNDING NOTE   Subjective:  Patient seen and evaluated during hemodialysis. Tolerating well. Lower extremity edema does appear to be improving.  Objective:  Vital signs in last 24 hours:  Temp:  [97.5 F (36.4 C)-98.1 F (36.7 C)] 98 F (36.7 C) (04/11 0955) Pulse Rate:  [72-107] 94 (04/11 1130) Resp:  [14-23] 23 (04/11 1130) BP: (90-125)/(48-72) 114/72 mmHg (04/11 1130) SpO2:  [89 %-100 %] 98 % (04/11 1130) Weight:  [92.67 kg (204 lb 4.8 oz)-99.927 kg (220 lb 4.8 oz)] 92.67 kg (204 lb 4.8 oz) (04/11 0955)  Weight change: -0.018 kg (-0.6 oz) Filed Weights   11/22/15 1349 11/23/15 0500 11/23/15 0955  Weight: 99.927 kg (220 lb 4.8 oz) 92.67 kg (204 lb 4.8 oz) 92.67 kg (204 lb 4.8 oz)    Intake/Output: I/O last 3 completed shifts: In: 240 [P.O.:240] Out: 2800 [Other:2800]   Intake/Output this shift:  Total I/O In: 240 [P.O.:240] Out: 0   Physical Exam: General: NAD, chronically ill appearing, laying in bed  Head: Normocephalic, atraumatic. moist oral mucosal membranes  Eyes: Anicteric  Neck: Supple, trachea midline  Lungs:  Clear ant/lat, normal effort  Heart: Regular rate and rhythm  Abdomen:  Soft, nontender, BS present  Extremities: + peripheral edema all 4 extremities/Anasarca  Neurologic: Nonfocal, moving all four extremities, alert, following commands  Skin: No acute lesions       Basic Metabolic Panel:  Recent Labs Lab 11/17/15 0415 11/18/15 0418 11/19/15 0633 11/20/15 1119 11/22/15 0419  NA 136 137 135 136 136  K 5.8* 4.7 4.4 4.0 4.0  CL 108 106 107 105 104  CO2 _0 GLUCOSE 122* 114* 98 148* 137*  BUN 82* 86* 92* 81* 39*  CREATININE 6.14* 6.51* 6.82* 6.34* 4.15*  CALCIUM 7.8* 7.7* 7.5* 7.4* 7.0*  PHOS  --   --  7.6* 6.8*  --     Liver Function Tests:  Recent Labs Lab 11/17/15 0415 11/19/15 0633 11/20/15 1119  AST 35  --   --   ALT 32  --   --   ALKPHOS 65  --   --   BILITOT 0.3  --   --    PROT 7.1  --   --   ALBUMIN 1.7* 1.5* 1.8*   No results for input(s): LIPASE, AMYLASE in the last 168 hours. No results for input(s): AMMONIA in the last 168 hours.  CBC:  Recent Labs Lab 11/19/15 0632 11/20/15 1119  WBC 8.7 7.3  HGB 11.6* 10.8*  HCT 34.5* 32.3*  MCV 89.3 89.6  PLT 214 153    Cardiac Enzymes: No results for input(s): CKTOTAL, CKMB, CKMBINDEX, TROPONINI in the last 168 hours.  BNP: Invalid input(s): POCBNP  CBG:  Recent Labs Lab 11/21/15 2129 11/22/15 0729 11/22/15 1705 11/22/15 2131 11/23/15 0730  GLUCAP 72 122* 136* 131* 156*    Microbiology: Results for orders placed or performed during the hospital encounter of 11/10/15  Urine culture     Status: None   Collection Time: 11/11/15  3:44 AM  Result Value Ref Range Status   Specimen Description URINE, RANDOM  Final   Special Requests NONE  Final   Culture MULTIPLE SPECIES PRESENT, SUGGEST RECOLLECTION  Final   Report Status 11/13/2015 FINAL  Final  Culture, body fluid-bottle     Status: None   Collection Time: 11/11/15 11:45 AM  Result Value Ref Range Status   Specimen Description PLEURAL  Final   Special Requests  NONE  Final   Gram Stain FEW WBC SEEN NO ORGANISMS SEEN   Final   Culture NO GROWTH 5 DAYS  Final   Report Status 11/16/2015 FINAL  Final    Coagulation Studies: No results for input(s): LABPROT, INR in the last 72 hours.  Urinalysis: No results for input(s): COLORURINE, LABSPEC, PHURINE, GLUCOSEU, HGBUR, BILIRUBINUR, KETONESUR, PROTEINUR, UROBILINOGEN, NITRITE, LEUKOCYTESUR in the last 72 hours.  Invalid input(s): APPERANCEUR    Imaging: No results found.   Medications:   . sodium chloride 20 mL/hr at 11/19/15 1130   . aspirin EC  81 mg Oral Daily  . feeding supplement (NEPRO CARB STEADY)  237 mL Oral BID BM  . heparin subcutaneous  5,000 Units Subcutaneous 3 times per day  . metoprolol tartrate  12.5 mg Oral BID  . multivitamin with minerals  1 tablet Oral  Daily  . senna-docusate  1 tablet Oral BID  . sodium chloride flush  3 mL Intravenous Q12H   acetaminophen, ondansetron (ZOFRAN) IV, polyethylene glycol  Assessment/ Plan:  [name] is a 75 y.o. white female with atrial fibrillation, diabetes mellitus type II, hypertension, vertigo, coronary artery disease, who was admitted to [facility] on 11/10/2015 with left pleural effusion. Status post left thoracentesis on 11/11/15  1. Acute renal failure on chronic kidney disease stage III with proteinuria: baseline creatinine 1.25 eGFR 41 from 09/11/15. Acute renal failure from acute cardiorenal syndrome. Recent thoracentesis (left) on 3/30 where 1.7 L of fluid was removed. Patient with multiple episodes of hypotension on 3/31, likely resulting in ATN - With worsening S Cr and BUN, significant edema, recommended Dialysis to patient - Difficult to assess for underlying renal function at the moment. She is seen and evaluated during dialysis treatment today. We do have dialysis placement for her on [address].  However she has an M spike in the serum of 2.8 which is significant, could have underlying myeloma, consult hematology for this issue.    2. Hypertension: with atrial fibrillation.  Echo from 1/17 reviewed, diastolic dysfunction. With anasarca on examination -   Blood pressure 114/72. Continue metoprolol.  3. Diabetes mellitus type II Insulin dependent with chronic kidney disease: hemoglobin A1c from 09/11/15 5.1% - Continue glucose control per hospitalist.  4. Hyperkalemia - Currently resolved with dialysis.  5. Generalized edema/Anasarca and Left pleural effusion: - status post ultrasound guided thoracentesis on 3/30 with 1.7 litres removed.  - Continue ultrafiltration with hemodialysis. Overall her edema has significantly improved.  6. Proteinuria - Albumin 1.5 - urine P/C 5.0.  - positive SPEP noted.   7.  M spike on SPEP:  ? Underlying myeloma, consult hematology for this issue. Would  delay discharge to have this addressed.      LOS: 13 [name] 4/11/201711:51 AM

## 2015-11-23 NOTE — Progress Notes (Signed)
Patient vomited small amount of light greenish vomitus this morning. Zofran 4 mg IV PRN administered with relief. No acute distress noted. Patient denied pain. Will continue to monitor.

## 2015-11-23 NOTE — Clinical Social Work Placement (Signed)
   CLINICAL SOCIAL WORK PLACEMENT  NOTE  Date:  11/23/2015  Patient Details  Name: Lisa Lowery MRN: [id number] Date of Birth: 1940/10/21  Clinical Social Work is seeking post-discharge placement for this patient at the [facility] level of care (*CSW will initial, date and re-position this form in  chart as items are completed):  Yes   Patient/family provided with [place] Work Department's list of facilities offering this level of care within the geographic area requested by the patient (or if unable, by the patient's family).  Yes   Patient/family informed of their freedom to choose among providers that offer the needed level of care, that participate in Medicare, Medicaid or managed care program needed by the patient, have an available bed and are willing to accept the patient.  Yes   Patient/family informed of [facility]'s ownership interest in [facility] and [facility], as well as of the fact that they are under no obligation to receive care at these facilities.  PASRR submitted to EDS on 11/19/15     PASRR number received on 11/19/15     Existing PASRR number confirmed on       FL2 transmitted to all facilities in geographic area requested by pt/family on 11/23/15     FL2 transmitted to all facilities within larger geographic area on       Patient informed that his/her managed care company has contracts with or will negotiate with certain facilities, including the following:            Patient/family informed of bed offers received.  Patient chooses bed at       Physician recommends and patient chooses bed at      Patient to be transferred to   on  .  Patient to be transferred to facility by       Patient family notified on   of transfer.  Name of family member notified:        PHYSICIAN       Additional Comment:    _______________________________________________ Loralyn Freshwater, LCSW 11/23/2015, 5:33 PM

## 2015-11-24 LAB — CBC
HCT: 32.2 % — ABNORMAL LOW (ref 35.0–47.0)
Hemoglobin: 10.5 g/dL — ABNORMAL LOW (ref 12.0–16.0)
MCH: 29.7 pg (ref 26.0–34.0)
MCHC: 32.8 g/dL (ref 32.0–36.0)
MCV: 90.7 fL (ref 80.0–100.0)
PLATELETS: 82 10*3/uL — AB (ref 150–440)
RBC: 3.54 MIL/uL — AB (ref 3.80–5.20)
RDW: 15.3 % — ABNORMAL HIGH (ref 11.5–14.5)
WBC: 8.4 10*3/uL (ref 3.6–11.0)

## 2015-11-24 LAB — BASIC METABOLIC PANEL
ANION GAP: 1 — AB (ref 5–15)
BUN: 20 mg/dL (ref 6–20)
CO2: 32 mmol/L (ref 22–32)
Calcium: 7.4 mg/dL — ABNORMAL LOW (ref 8.9–10.3)
Chloride: 105 mmol/L (ref 101–111)
Creatinine, Ser: 3.18 mg/dL — ABNORMAL HIGH (ref 0.44–1.00)
GFR calc Af Amer: 15 mL/min — ABNORMAL LOW (ref >60)
GFR, EST NON AFRICAN AMERICAN: 13 mL/min — AB (ref >60)
GLUCOSE: 112 mg/dL — AB (ref 65–99)
POTASSIUM: 3.8 mmol/L (ref 3.5–5.1)
SODIUM: 138 mmol/L (ref 135–145)

## 2015-11-24 LAB — GLUCOSE, CAPILLARY
GLUCOSE-CAPILLARY: 95 mg/dL (ref 65–99)
GLUCOSE-CAPILLARY: 81 mg/dL (ref 65–99)

## 2015-11-24 MED ORDER — METOPROLOL TARTRATE 25 MG PO TABS: 12.5000 mg | ORAL_TABLET | Freq: Two times a day (BID) | ORAL | Status: DC

## 2015-11-24 MED ORDER — ADULT MULTIVITAMIN W/MINERALS CH: 1.0000 | ORAL_TABLET | Freq: Every day | ORAL | Status: DC

## 2015-11-24 MED ORDER — NEPRO/CARBSTEADY PO LIQD: 237.0000 mL | Freq: Two times a day (BID) | ORAL | Status: AC

## 2015-11-24 NOTE — Progress Notes (Signed)
Patient discharged via [facility]. IV removed and catheter intact. All discharge instructions given and patient verbalizes understanding. Tele removed and returned. No prescriptions given to patient No distress noted.

## 2015-11-24 NOTE — Clinical Social Work Placement (Signed)
   CLINICAL SOCIAL WORK PLACEMENT  NOTE  Date:  11/24/2015  Patient Details  Name: Lisa Lowery MRN: [id number] Date of Birth: 05/25/1941  Clinical Social Work is seeking post-discharge placement for this patient at the [place] level of care (*CSW will initial, date and re-position this form in  chart as items are completed):  Yes   Patient/family provided with [place] Work Department's list of facilities offering this level of care within the geographic area requested by the patient (or if unable, by the patient's family).  Yes   Patient/family informed of their freedom to choose among providers that offer the needed level of care, that participate in Medicare, Medicaid or managed care program needed by the patient, have an available bed and are willing to accept the patient.  Yes   Patient/family informed of [facility]'s ownership interest in [facility] and [facility], as well as of the fact that they are under no obligation to receive care at these facilities.  PASRR submitted to EDS on 11/19/15     PASRR number received on 11/19/15     Existing PASRR number confirmed on       FL2 transmitted to all facilities in geographic area requested by pt/family on 11/23/15     FL2 transmitted to all facilities within larger geographic area on       Patient informed that his/her managed care company has contracts with or will negotiate with certain facilities, including the following:        Yes   Patient/family informed of bed offers received.  Patient chooses bed at  ([facility] )     Physician recommends and patient chooses bed at      Patient to be transferred to  ([facility] ) on 11/24/15.  Patient to be transferred to facility by  [facility] )     Patient family notified on 11/24/15 of transfer.  Name of family member notified:   (Patient's son Delfino Lovett is aware of D/C today. )     PHYSICIAN       Additional Comment:     _______________________________________________ Loralyn Freshwater, LCSW 11/24/2015, 2:20 PM

## 2015-11-24 NOTE — Progress Notes (Signed)
Patient in chair for dialysis. Tolerating well

## 2015-11-24 NOTE — Progress Notes (Signed)
[facility]  ROUNDING NOTE   Subjective:  Patient seen and again during dialysis today. We again went over the fact that she has abnormal proteins both in the blood and urine. She had an abnormal SPEP and UPEP. Case was discussed in depth with Dr. Grayland Ormond who recommended bone marrow biopsy. i discussed this with the patient again today and again she stated that she wanted to hold off on bone marrow biopsy. 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  Objective:  Vital signs in last 24 hours:  Temp:  [97.4 F (36.3 C)-98.3 F (36.8 C)] 97.7 F (36.5 C) (04/12 1015) Pulse Rate:  [91-108] 91 (04/12 1030) Resp:  [16-27] 16 (04/12 0507) BP: (96-111)/(52-72) 105/52 mmHg (04/12 0507) SpO2:  [97 %-100 %] 97 % (04/12 0507) Weight:  [90.629 kg (199 lb 12.8 oz)-98.34 kg (216 lb 12.8 oz)] 90.629 kg (199 lb 12.8 oz) (04/12 0507)  Weight change: -7.33 kg (-16 lb 2.6 oz) Filed Weights   11/23/15 0955 11/23/15 1444 11/24/15 0507  Weight: 92.67 kg (204 lb 4.8 oz) 98.34 kg (216 lb 12.8 oz) 90.629 kg (199 lb 12.8 oz)    Intake/Output: I/O last 3 completed shifts: In: 240 [P.O.:240] Out: 2500 [Other:2500]   Intake/Output this shift:  Total I/O In: 120 [P.O.:120] Out: 0   Physical Exam: General: NAD, chronically ill appearing, laying in bed  Head: Normocephalic, atraumatic. moist oral mucosal membranes  Eyes: Anicteric  Neck: Supple, trachea midline  Lungs:  Clear ant/lat, normal effort  Heart: Regular rate and rhythm  Abdomen:  Soft, nontender, BS present  Extremities: + peripheral edema all 4 extremities/Anasarca  Neurologic: Nonfocal, moving all four extremities, alert, following commands  Skin: No acute lesions       Basic Metabolic Panel:  Recent Labs Lab 11/18/15 0418 11/19/15 0633 11/20/15 1119 11/22/15 0419 11/24/15 0418  NA 137 135 136 136 138  K 4.7 4.4 4.0 4.0 3.8  CL 106  107 105 104 105  CO2 23 23 24 31  32  GLUCOSE 114* 98 148* 137* 112*  BUN 86* 92* 81* 39* 20  CREATININE 6.51* 6.82* 6.34* 4.15* 3.18*  CALCIUM 7.7* 7.5* 7.4* 7.0* 7.4*  PHOS  --  7.6* 6.8*  --   --     Liver Function Tests:  Recent Labs Lab 11/19/15 0633 11/20/15 1119  ALBUMIN 1.5* 1.8*   No results for input(s): LIPASE, AMYLASE in the last 168 hours. No results for input(s): AMMONIA in the last 168 hours.  CBC:  Recent Labs Lab 11/19/15 0632 11/20/15 1119 11/24/15 0418  WBC 8.7 7.3 8.4  HGB 11.6* 10.8* 10.5*  HCT 34.5* 32.3* 32.2*  MCV 89.3 89.6 90.7  PLT 214 153 82*    Cardiac Enzymes: No results for input(s): CKTOTAL, CKMB, CKMBINDEX, TROPONINI in the last 168 hours.  BNP: Invalid input(s): POCBNP  CBG:  Recent Labs Lab 11/23/15 0730 11/23/15 1440 11/23/15 1703 11/23/15 2128 11/24/15 0738  GLUCAP 156* 87 120* 115* 95    Microbiology: Results for orders placed or performed during the hospital encounter of 11/10/15  Urine culture     Status: None   Collection Time: 11/11/15  3:44 AM  Result Value Ref Range Status   Specimen Description URINE, RANDOM  Final   Special Requests NONE  Final   Culture MULTIPLE SPECIES PRESENT, SUGGEST RECOLLECTION  Final   Report Status 11/13/2015 FINAL  Final  Culture, body fluid-bottle  Status: None   Collection Time: 11/11/15 11:45 AM  Result Value Ref Range Status   Specimen Description PLEURAL  Final   Special Requests NONE  Final   Gram Stain FEW WBC SEEN NO ORGANISMS SEEN   Final   Culture NO GROWTH 5 DAYS  Final   Report Status 11/16/2015 FINAL  Final    Coagulation Studies: No results for input(s): LABPROT, INR in the last 72 hours.  Urinalysis: No results for input(s): COLORURINE, LABSPEC, PHURINE, GLUCOSEU, HGBUR, BILIRUBINUR, KETONESUR, PROTEINUR, UROBILINOGEN, NITRITE, LEUKOCYTESUR in the last 72 hours.  Invalid input(s): APPERANCEUR    Imaging: No results found.   Medications:   .  sodium chloride 20 mL/hr at 11/19/15 1130   . aspirin EC  81 mg Oral Daily  . feeding supplement (NEPRO CARB STEADY)  237 mL Oral BID BM  . heparin subcutaneous  5,000 Units Subcutaneous 3 times per day  . metoprolol tartrate  12.5 mg Oral BID  . multivitamin with minerals  1 tablet Oral Daily  . senna-docusate  1 tablet Oral BID  . sodium chloride flush  3 mL Intravenous Q12H   acetaminophen, ondansetron (ZOFRAN) IV, polyethylene glycol  Assessment/ Plan:  Ms. Ranessa Kosta is a 75 y.o. white female with atrial fibrillation, diabetes mellitus type II, hypertension, vertigo, coronary artery disease, who was admitted to [facility] on 11/10/2015 with left pleural effusion. Status post left thoracentesis on 11/11/15  1. Acute renal failure on chronic kidney disease stage III with proteinuria: baseline creatinine 1.25 eGFR 41 from 09/11/15. 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 Recent thoracentesis (left) on 3/30 where 1.7 L of fluid was removed. Patient with multiple episodes of hypotension on 3/31, likely resulting in ATN -  Patient has abnormal SPEP and UPEP.  Therefore multiple myeloma high on the differential.  Alternatively she could have underlying amyloidosis as well. We have recommended that she consider bone marrow biopsy however she declines.  Renal biopsy could be considered in the future however we are hesitant to perform this as she is very high risk for bleeding.  Patient was seen and evaluated during dialysis today.  Her outpatient dialysis is set at [facility] [place].   2. Hypertension: with atrial fibrillation.  Echo from 1/17 reviewed, diastolic dysfunction. With anasarca on examination -   Blood pressure 105/52.  Continue metoprolol.  3. Diabetes mellitus type II Insulin dependent with chronic kidney disease: hemoglobin A1c from 09/11/15 5.1% - Continue glucose control per hospitalist.  4.  Hyperkalemia - otassium 3.8.  Hyperkalemia resolved with dialysis.  5. Generalized edema/Anasarca and Left pleural effusion: - status post ultrasound guided thoracentesis on 3/30 with 1.7 litres removed.  - significant improvement noted in lower extremity edema.  She will need ongoing dialysis with ultrafiltration.  6. Proteinuria - Albumin 1.5 - urine P/C 5.0.  - positive SPEP/UPEP noted.   7.  M spike on SPEP/UPEP:  Patient has high M spike in the serum measuring 2.8.  We have consulted oncology who recommended bone marrow biopsy however patient has declined at this time. Overall she is likely to have poor outcome with no treatment.  May need to consider palliative care.      LOS: [place] 4/12/201712:11 PM

## 2015-11-24 NOTE — Discharge Summary (Signed)
[place] [facility] [place] NAME: Lisa Lowery    MR#:  [id number]  DATE OF BIRTH:  [birth year]  DATE OF ADMISSION:  11/10/2015 ADMITTING PHYSICIAN: Juluis Mire, MD  DATE OF DISCHARGE: *11/24/2015  PRIMARY CARE PHYSICIAN: No PCP Per Patient    ADMISSION DIAGNOSIS:  Pleural effusion on left [J94.8] Atrial fibrillation with RVR (HCC) [I48.91] CKD (chronic kidney disease), stage 3 (moderate) [N18.3]  DISCHARGE DIAGNOSIS:  Principal Problem:   Pleural effusion, left Active Problems:   Elevated troponin   HTN (hypertension)   Atrial fibrillation with RVR (HCC)   General weakness   DM2 (diabetes mellitus, type 2) (HCC)   Malnutrition of moderate degree   SECONDARY DIAGNOSIS:   Past Medical History  Diagnosis Date  . Vertigo   . Hypertension   . Diabetes ([place])   . A-fib [facility]     HOSPITAL COURSE:   75 year old female patient with history of diastolic CHF, chronic troponin elevation, atrial fibrillation and noncompliance presents to the hospital with shortness of breath and lower extremity swelling.  1. Acute renal failure on chronic kidney disease stage III: This isdue to cardiorenal syndrome. She is now ESRD. She has been set up for outpatient dialysis. Sh has an M spike in serum which is significant and could represent underlying multiple myeloma. She was evaluated by ONCOLOGY service, but she did not want a BM biopsy. She will follow up with ONCOLOGY in 2 weeks as an outpatient.  Consults oncology  Hyperkalemia resolved with hemodialysis. PermCath in place.   2. Bilateral pleural effusions left greater than right due to congestive heart failure and hypoalbuminemia; Status post left thoracentesis 3/30 with 1.7 L. This was transudative in nature related to ESRD.  3 Acute on chronic diastolic congestive heart failure with acute respiratory failure: Resolved with hemodialysis   4. A. Fib with RVR: Patient currently in  normal sinus rhythm  Heart rate is controlled on metoprolol.   5 UTI urine culture showed multiple organisms and recommended re-collection. She completed antibiotics.  6 Ileus Resolved with Dulcolax suppository. Repeat x-ray was normal  7 Diabetes mellitus: Continue ADA diet. Herlast hemoglobin A1c in January was 5.1   8. Chronic mild elevation of troponin is stable She was ruled out for ACS   9. M spike: As mentioned she did notwant BM biopsy and will follow up with Dr Grayland Ormond on 2 weeksy  10. Thrombocytopenia: Platlet count has decreased since admission. She does need PLT transfusions but will need outpatient follow up.   DISCHARGE CONDITIONS AND DIET:  Stable on renal diet  CONSULTS OBTAINED:  Treatment Team:  Isaias Cowman, MD Lavonia Dana, MD Katha Cabal, MD Anders Simmonds, MD Lloyd Huger, MD  DRUG ALLERGIES:  No Known Allergies  DISCHARGE MEDICATIONS:   Current Discharge Medication List    START taking these medications   Details  metoprolol tartrate (LOPRESSOR) 25 MG tablet Take 0.5 tablets (12.5 mg total) by mouth 2 (two) times daily. Qty: 60 tablet, Refills: 0    Multiple Vitamin (MULTIVITAMIN WITH MINERALS) TABS tablet Take 1 tablet by mouth daily. Qty: 30 tablet, Refills: 0    Nutritional Supplements (FEEDING SUPPLEMENT, NEPRO CARB STEADY,) LIQD Take 237 mLs by mouth 2 (two) times daily between meals. Qty: 1000 mL, Refills: 0      STOP taking these medications     Insulin Detemir (LEVEMIR FLEXTOUCH) 100 UNIT/ML Pen      lisinopril (PRINIVIL,ZESTRIL) 2.5 MG tablet  Today   CHIEF COMPLAINT:  Doing okay this am No acute events overnight No increased SOB still with LEE   VITAL SIGNS:  Blood pressure 105/52, pulse 98, temperature 98.3 F (36.8 C), temperature source Oral, resp. rate 16, height 5' 7"  (1.702 m), weight 90.629 kg (199 lb 12.8 oz), SpO2 97 %.   REVIEW OF SYSTEMS:  Review of  Systems  Constitutional: Negative for fever, chills and malaise/fatigue.  HENT: Negative for ear discharge, ear pain, hearing loss, nosebleeds and sore throat.   Eyes: Negative for blurred vision and pain.  Respiratory: Negative for cough, hemoptysis, shortness of breath and wheezing.   Cardiovascular: Positive for leg swelling. Negative for chest pain and palpitations.  Gastrointestinal: Negative for nausea, vomiting, abdominal pain, diarrhea and blood in stool.  Genitourinary: Negative for dysuria.  Musculoskeletal: Negative for back pain.  Neurological: Negative for dizziness, tremors, speech change, focal weakness, seizures and headaches.  Endo/Heme/Allergies: Does not bruise/bleed easily.  Psychiatric/Behavioral: Negative for depression, suicidal ideas and hallucinations.     PHYSICAL EXAMINATION:  GENERAL:  75 y.o.-year-old patient lying in the bed with no acute distress.  NECK:  Supple, no jugular venous distention. No thyroid enlargement, no tenderness.  LUNGS: Normal breath sounds bilaterally, no wheezing, rales,rhonchi  No use of accessory muscles of respiration.  CARDIOVASCULAR: S1, S2 normal. No murmurs, rubs, or gallops.  ABDOMEN: Soft, non-tender, non-distended. Bowel sounds present. No organomegaly or mass.  EXTREMITIES: ++3+ LE edema, NO cyanosis, or clubbing.  PSYCHIATRIC: The patient is alert and oriented x 3.  SKIN: No obvious rash, lesion, or ulcer.   DATA REVIEW:   CBC  Recent Labs Lab 11/24/15 0418  WBC 8.4  HGB 10.5*  HCT 32.2*  PLT 82*    Chemistries   Recent Labs Lab 11/24/15 0418  NA 138  K 3.8  CL 105  CO2 32  GLUCOSE 112*  BUN 20  CREATININE 3.18*  CALCIUM 7.4*    Cardiac Enzymes No results for input(s): TROPONINI in the last 168 hours.  Microbiology Results  @[email or web address] @  RADIOLOGY:  No results found.    Management plans discussed with the patient and she is in agreement. Stable for discharge SNF Patient should follow up  with PCP in 1 week oncology and nephrology  CODE STATUS:     Code Status Orders        Start     Ordered   11/10/15 2220  Full code   Continuous     11/10/15 2227    Code Status History    Date Active Date Inactive Code Status Order ID Comments User Context   09/11/2015  3:56 AM 09/13/2015  6:15 PM Full Code [id number]  Harrie Foreman, MD ED   02/21/2015  4:58 AM 02/23/2015  5:24 PM Full Code [id number]  Juluis Mire, MD Inpatient      TOTAL TIME TAKING CARE OF THIS PATIENT: 36 minutes.    Note: This dictation was prepared with Dragon dictation along with smaller phrase technology. Any transcriptional errors that result from this process are unintentional.  Tianne Plott M.D on 11/24/2015 at 8:44 AM  Between 7am to 6pm - Pager - [phone] After 6pm go to [email or web address] - password [email or web address] [facility]  [facility]  Office  [phone]  CC: Primary care physician; No PCP Per Patient

## 2015-11-24 NOTE — Progress Notes (Addendum)
Clinical Social Worker (CSW) presented bed offers to patient she chose [facility]. Patient is medically stable for D/C to [facility] today. Per [facility] liaison patient will go to room 803. RN will call report to Yaakov Guthrie at [phone] and arrange EMS for transport. CSW sent D/C Summary, FL2 and D/C Packet to [facility] via [facility]. Patient's son Delfino Lovett is aware of above. [facility] dialysis liaison is aware of above. Please reconsult if future social work needs arise. CSW signing off.   Blima Rich, LCSW [phone]

## 2015-11-24 NOTE — Care Management (Signed)
Plan is discharge to SNF today.

## 2015-12-15 ENCOUNTER — Inpatient Hospital Stay: Payer: Medicare (Managed Care) | Admitting: Oncology

## 2015-12-16 ENCOUNTER — Inpatient Hospital Stay: Payer: 59 | Admitting: Oncology

## 2015-12-17 ENCOUNTER — Emergency Department: Payer: Medicare Other

## 2015-12-17 ENCOUNTER — Inpatient Hospital Stay: Payer: Medicare Other

## 2015-12-17 ENCOUNTER — Inpatient Hospital Stay
Admission: EM | Admit: 2015-12-17 | Discharge: 2016-01-13 | DRG: 871 | Disposition: E | Payer: Medicare Other | Attending: Internal Medicine | Admitting: Internal Medicine

## 2015-12-17 ENCOUNTER — Other Ambulatory Visit: Payer: 59

## 2015-12-17 ENCOUNTER — Encounter: Payer: Self-pay | Admitting: Emergency Medicine

## 2015-12-17 DIAGNOSIS — Z7401 Bed confinement status: Secondary | ICD-10-CM

## 2015-12-17 DIAGNOSIS — Z66 Do not resuscitate: Secondary | ICD-10-CM | POA: Diagnosis not present

## 2015-12-17 DIAGNOSIS — R188 Other ascites: Secondary | ICD-10-CM | POA: Diagnosis present

## 2015-12-17 DIAGNOSIS — R1011 Right upper quadrant pain: Secondary | ICD-10-CM | POA: Diagnosis present

## 2015-12-17 DIAGNOSIS — R0600 Dyspnea, unspecified: Secondary | ICD-10-CM

## 2015-12-17 DIAGNOSIS — R079 Chest pain, unspecified: Secondary | ICD-10-CM

## 2015-12-17 DIAGNOSIS — N179 Acute kidney failure, unspecified: Secondary | ICD-10-CM | POA: Diagnosis present

## 2015-12-17 DIAGNOSIS — A419 Sepsis, unspecified organism: Secondary | ICD-10-CM | POA: Diagnosis present

## 2015-12-17 DIAGNOSIS — K265 Chronic or unspecified duodenal ulcer with perforation: Secondary | ICD-10-CM | POA: Diagnosis present

## 2015-12-17 DIAGNOSIS — E1122 Type 2 diabetes mellitus with diabetic chronic kidney disease: Secondary | ICD-10-CM | POA: Diagnosis present

## 2015-12-17 DIAGNOSIS — E43 Unspecified severe protein-calorie malnutrition: Secondary | ICD-10-CM | POA: Diagnosis present

## 2015-12-17 DIAGNOSIS — Z515 Encounter for palliative care: Secondary | ICD-10-CM | POA: Diagnosis not present

## 2015-12-17 DIAGNOSIS — E872 Acidosis: Secondary | ICD-10-CM | POA: Diagnosis not present

## 2015-12-17 DIAGNOSIS — R110 Nausea: Secondary | ICD-10-CM

## 2015-12-17 DIAGNOSIS — N186 End stage renal disease: Secondary | ICD-10-CM | POA: Diagnosis not present

## 2015-12-17 DIAGNOSIS — Z809 Family history of malignant neoplasm, unspecified: Secondary | ICD-10-CM | POA: Diagnosis not present

## 2015-12-17 DIAGNOSIS — K766 Portal hypertension: Secondary | ICD-10-CM | POA: Diagnosis present

## 2015-12-17 DIAGNOSIS — R6521 Severe sepsis with septic shock: Secondary | ICD-10-CM | POA: Diagnosis present

## 2015-12-17 DIAGNOSIS — R5381 Other malaise: Secondary | ICD-10-CM | POA: Diagnosis present

## 2015-12-17 DIAGNOSIS — C9000 Multiple myeloma not having achieved remission: Secondary | ICD-10-CM | POA: Diagnosis present

## 2015-12-17 DIAGNOSIS — K7460 Unspecified cirrhosis of liver: Secondary | ICD-10-CM | POA: Diagnosis present

## 2015-12-17 DIAGNOSIS — Z833 Family history of diabetes mellitus: Secondary | ICD-10-CM | POA: Diagnosis not present

## 2015-12-17 DIAGNOSIS — R0602 Shortness of breath: Secondary | ICD-10-CM

## 2015-12-17 DIAGNOSIS — R34 Anuria and oliguria: Secondary | ICD-10-CM | POA: Diagnosis not present

## 2015-12-17 DIAGNOSIS — I4891 Unspecified atrial fibrillation: Secondary | ICD-10-CM | POA: Diagnosis present

## 2015-12-17 DIAGNOSIS — Z4659 Encounter for fitting and adjustment of other gastrointestinal appliance and device: Secondary | ICD-10-CM

## 2015-12-17 DIAGNOSIS — Z79899 Other long term (current) drug therapy: Secondary | ICD-10-CM

## 2015-12-17 DIAGNOSIS — R0902 Hypoxemia: Secondary | ICD-10-CM | POA: Diagnosis not present

## 2015-12-17 DIAGNOSIS — J90 Pleural effusion, not elsewhere classified: Secondary | ICD-10-CM | POA: Diagnosis present

## 2015-12-17 DIAGNOSIS — K668 Other specified disorders of peritoneum: Secondary | ICD-10-CM | POA: Insufficient documentation

## 2015-12-17 DIAGNOSIS — I959 Hypotension, unspecified: Secondary | ICD-10-CM

## 2015-12-17 DIAGNOSIS — I4892 Unspecified atrial flutter: Secondary | ICD-10-CM | POA: Diagnosis not present

## 2015-12-17 DIAGNOSIS — I120 Hypertensive chronic kidney disease with stage 5 chronic kidney disease or end stage renal disease: Secondary | ICD-10-CM | POA: Diagnosis present

## 2015-12-17 DIAGNOSIS — Z992 Dependence on renal dialysis: Secondary | ICD-10-CM | POA: Diagnosis not present

## 2015-12-17 DIAGNOSIS — D631 Anemia in chronic kidney disease: Secondary | ICD-10-CM | POA: Diagnosis present

## 2015-12-17 DIAGNOSIS — Z452 Encounter for adjustment and management of vascular access device: Secondary | ICD-10-CM

## 2015-12-17 HISTORY — DX: Multiple myeloma not having achieved remission: C90.00

## 2015-12-17 HISTORY — DX: Disorder of kidney and ureter, unspecified: N28.9

## 2015-12-17 LAB — TROPONIN I
TROPONIN I: 0.53 ng/mL — AB (ref <0.031)
TROPONIN I: 0.69 ng/mL — AB (ref <0.031)
Troponin I: 0.7 ng/mL — ABNORMAL HIGH (ref <0.031)

## 2015-12-17 LAB — COMPREHENSIVE METABOLIC PANEL
ALBUMIN: 1.7 g/dL — AB (ref 3.5–5.0)
ALK PHOS: 79 U/L (ref 38–126)
ALT: 148 U/L — ABNORMAL HIGH (ref 14–54)
ANION GAP: 10 (ref 5–15)
AST: 261 U/L — ABNORMAL HIGH (ref 15–41)
BILIRUBIN TOTAL: 1 mg/dL (ref 0.3–1.2)
BUN: 31 mg/dL — ABNORMAL HIGH (ref 6–20)
CALCIUM: 8.1 mg/dL — AB (ref 8.9–10.3)
CO2: 23 mmol/L (ref 22–32)
Chloride: 100 mmol/L — ABNORMAL LOW (ref 101–111)
Creatinine, Ser: 5.51 mg/dL — ABNORMAL HIGH (ref 0.44–1.00)
GFR, EST AFRICAN AMERICAN: 8 mL/min — AB (ref >60)
GFR, EST NON AFRICAN AMERICAN: 7 mL/min — AB (ref >60)
GLUCOSE: 89 mg/dL (ref 65–99)
POTASSIUM: 4.2 mmol/L (ref 3.5–5.1)
Sodium: 133 mmol/L — ABNORMAL LOW (ref 135–145)
TOTAL PROTEIN: 8.3 g/dL — AB (ref 6.5–8.1)

## 2015-12-17 LAB — CBC WITH DIFFERENTIAL/PLATELET
Basophils Absolute: 0 10*3/uL (ref 0–0.1)
Basophils Relative: 1 %
EOS ABS: 0 10*3/uL (ref 0–0.7)
HEMATOCRIT: 35.4 % (ref 35.0–47.0)
HEMOGLOBIN: 11.3 g/dL — AB (ref 12.0–16.0)
Lymphocytes Relative: 17 %
Lymphs Abs: 1.5 10*3/uL (ref 1.0–3.6)
MCH: 30.5 pg (ref 26.0–34.0)
MCHC: 32.1 g/dL (ref 32.0–36.0)
MCV: 95 fL (ref 80.0–100.0)
Monocytes Absolute: 0.9 10*3/uL (ref 0.2–0.9)
Monocytes Relative: 10 %
Neutro Abs: 6.2 10*3/uL (ref 1.4–6.5)
Neutrophils Relative %: 71 %
Platelets: 205 10*3/uL (ref 150–440)
RBC: 3.72 MIL/uL — ABNORMAL LOW (ref 3.80–5.20)
RDW: 19.4 % — AB (ref 11.5–14.5)
WBC: 8.7 10*3/uL (ref 3.6–11.0)

## 2015-12-17 LAB — URINALYSIS COMPLETE WITH MICROSCOPIC (ARMC ONLY)
BACTERIA UA: NONE SEEN
Bilirubin Urine: NEGATIVE
GLUCOSE, UA: NEGATIVE mg/dL
Ketones, ur: NEGATIVE mg/dL
NITRITE: NEGATIVE
PH: 5 (ref 5.0–8.0)
Protein, ur: 100 mg/dL — AB
Specific Gravity, Urine: 1.034 — ABNORMAL HIGH (ref 1.005–1.030)

## 2015-12-17 LAB — MRSA PCR SCREENING: MRSA BY PCR: NEGATIVE

## 2015-12-17 LAB — LACTIC ACID, PLASMA
LACTIC ACID, VENOUS: 4.5 mmol/L — AB (ref 0.5–2.0)
Lactic Acid, Venous: 5.3 mmol/L (ref 0.5–2.0)

## 2015-12-17 LAB — GLUCOSE, CAPILLARY
GLUCOSE-CAPILLARY: 83 mg/dL (ref 65–99)
GLUCOSE-CAPILLARY: 85 mg/dL (ref 65–99)
Glucose-Capillary: 81 mg/dL (ref 65–99)
Glucose-Capillary: 80 mg/dL (ref 65–99)

## 2015-12-17 LAB — HEMOGLOBIN A1C: Hgb A1c MFr Bld: 5.2 % (ref 4.0–6.0)

## 2015-12-17 LAB — APTT: aPTT: 33 seconds (ref 24–36)

## 2015-12-17 LAB — PROTIME-INR
INR: 1.62
Prothrombin Time: 19.3 seconds — ABNORMAL HIGH (ref 11.4–15.0)

## 2015-12-17 LAB — LIPASE, BLOOD: LIPASE: 17 U/L (ref 11–51)

## 2015-12-17 MED ORDER — CETYLPYRIDINIUM CHLORIDE 0.05 % MT LIQD: 7.0000 mL | Freq: Two times a day (BID) | OROMUCOSAL | Status: DC

## 2015-12-17 MED ORDER — VANCOMYCIN HCL IN DEXTROSE 750-5 MG/150ML-% IV SOLN: 750.0000 mg | INTRAVENOUS | Status: DC

## 2015-12-17 MED ORDER — FLEET ENEMA 7-19 GM/118ML RE ENEM: 1.0000 | ENEMA | Freq: Once | RECTAL | Status: DC | PRN

## 2015-12-17 MED ORDER — IOPAMIDOL (ISOVUE-300) INJECTION 61%
75.0000 mL | Freq: Once | INTRAVENOUS | Status: AC | PRN
  Administered 2015-12-17: 75 mL via INTRAVENOUS

## 2015-12-17 MED ORDER — SODIUM CHLORIDE 0.9 % IV SOLN
8.0000 mg/h | INTRAVENOUS | Status: DC
  Administered 2015-12-17 – 2015-12-19 (×6): 8 mg/h via INTRAVENOUS
  Filled 2015-12-17 (×7): qty 80

## 2015-12-17 MED ORDER — DIATRIZOATE MEGLUMINE & SODIUM 66-10 % PO SOLN
15.0000 mL | ORAL | Status: AC
  Administered 2015-12-17: 15 mL via ORAL

## 2015-12-17 MED ORDER — SODIUM CHLORIDE 0.9 % IV BOLUS (SEPSIS)
250.0000 mL | Freq: Once | INTRAVENOUS | Status: AC
Start: 2015-12-17 — End: 2015-12-17
  Administered 2015-12-17: 250 mL via INTRAVENOUS

## 2015-12-17 MED ORDER — SODIUM CHLORIDE 0.9% FLUSH: 10.0000 mL | Freq: Two times a day (BID) | INTRAVENOUS | Status: DC

## 2015-12-17 MED ORDER — STERILE WATER FOR INJECTION IV SOLN: INTRAVENOUS | Status: DC

## 2015-12-17 MED ORDER — ONDANSETRON HCL 4 MG/2ML IJ SOLN
4.0000 mg | Freq: Four times a day (QID) | INTRAMUSCULAR | Status: DC | PRN
  Administered 2015-12-17 – 2015-12-18 (×2): 4 mg via INTRAVENOUS
  Filled 2015-12-17 (×2): qty 2

## 2015-12-17 MED ORDER — INSULIN ASPART 100 UNIT/ML ~~LOC~~ SOLN: 0.0000 [IU] | Freq: Every day | SUBCUTANEOUS | Status: DC

## 2015-12-17 MED ORDER — DEXTROSE 5 % IV SOLN: 1.0000 g | INTRAVENOUS | Status: DC

## 2015-12-17 MED ORDER — BARRIER CREAM NON-SPECIFIED
1.0000 "application " | TOPICAL_CREAM | Freq: Three times a day (TID) | TOPICAL | Status: DC
  Administered 2015-12-17 – 2015-12-19 (×7): 1 via TOPICAL
  Filled 2015-12-17: qty 1

## 2015-12-17 MED ORDER — MORPHINE SULFATE (PF) 2 MG/ML IV SOLN
2.0000 mg | INTRAVENOUS | Status: DC | PRN
  Administered 2015-12-19 – 2015-12-20 (×6): 2 mg via INTRAVENOUS
  Filled 2015-12-17 (×7): qty 1

## 2015-12-17 MED ORDER — HEPARIN BOLUS VIA INFUSION
4000.0000 [IU] | Freq: Once | INTRAVENOUS | Status: AC
  Administered 2015-12-17: 4000 [IU] via INTRAVENOUS
  Filled 2015-12-17: qty 4000

## 2015-12-17 MED ORDER — SODIUM CHLORIDE 0.9 % IV BOLUS (SEPSIS)
250.0000 mL | Freq: Once | INTRAVENOUS | Status: AC
  Administered 2015-12-17: 250 mL via INTRAVENOUS

## 2015-12-17 MED ORDER — ALBUTEROL SULFATE (2.5 MG/3ML) 0.083% IN NEBU: 2.5000 mg | INHALATION_SOLUTION | RESPIRATORY_TRACT | Status: DC | PRN

## 2015-12-17 MED ORDER — SODIUM CHLORIDE 0.9% FLUSH
3.0000 mL | Freq: Two times a day (BID) | INTRAVENOUS | Status: DC
  Administered 2015-12-17: 3 mL via INTRAVENOUS

## 2015-12-17 MED ORDER — IOPAMIDOL (ISOVUE-370) INJECTION 76%
80.0000 mL | Freq: Once | INTRAVENOUS | Status: AC | PRN
  Administered 2015-12-17: 80 mL via INTRAVENOUS

## 2015-12-17 MED ORDER — NOREPINEPHRINE 4 MG/250ML-% IV SOLN
0.0000 ug/min | INTRAVENOUS | Status: DC
  Administered 2015-12-17: 2 ug/min via INTRAVENOUS
  Administered 2015-12-18: 13 ug/min via INTRAVENOUS
  Administered 2015-12-18: 10 ug/min via INTRAVENOUS
  Administered 2015-12-18: 6 ug/min via INTRAVENOUS
  Administered 2015-12-19: 18 ug/min via INTRAVENOUS
  Administered 2015-12-19: 8 ug/min via INTRAVENOUS
  Filled 2015-12-17 (×7): qty 250

## 2015-12-17 MED ORDER — PIPERACILLIN-TAZOBACTAM 3.375 G IVPB
3.3750 g | Freq: Once | INTRAVENOUS | Status: AC
  Administered 2015-12-17: 3.375 g via INTRAVENOUS
  Filled 2015-12-17: qty 50

## 2015-12-17 MED ORDER — SODIUM CHLORIDE 0.9 % IV BOLUS (SEPSIS)
500.0000 mL | Freq: Once | INTRAVENOUS | Status: AC
  Administered 2015-12-17: 500 mL via INTRAVENOUS

## 2015-12-17 MED ORDER — PANTOPRAZOLE SODIUM 40 MG IV SOLR: 40.0000 mg | Freq: Two times a day (BID) | INTRAVENOUS | Status: DC

## 2015-12-17 MED ORDER — SODIUM CHLORIDE 0.9 % IV BOLUS (SEPSIS)
1000.0000 mL | Freq: Once | INTRAVENOUS | Status: AC
  Administered 2015-12-17: 1000 mL via INTRAVENOUS

## 2015-12-17 MED ORDER — ACETAMINOPHEN 325 MG PO TABS: 650.0000 mg | ORAL_TABLET | Freq: Four times a day (QID) | ORAL | Status: DC | PRN

## 2015-12-17 MED ORDER — INSULIN ASPART 100 UNIT/ML ~~LOC~~ SOLN: 0.0000 [IU] | Freq: Three times a day (TID) | SUBCUTANEOUS | Status: DC

## 2015-12-17 MED ORDER — CETYLPYRIDINIUM CHLORIDE 0.05 % MT LIQD
7.0000 mL | Freq: Two times a day (BID) | OROMUCOSAL | Status: DC
  Administered 2015-12-17 – 2015-12-19 (×5): 7 mL via OROMUCOSAL

## 2015-12-17 MED ORDER — SODIUM CHLORIDE 0.9 % IV SOLN
80.0000 mg | Freq: Once | INTRAVENOUS | Status: AC
  Administered 2015-12-17: 80 mg via INTRAVENOUS
  Filled 2015-12-17: qty 80

## 2015-12-17 MED ORDER — SODIUM CHLORIDE 0.9 % IV SOLN
INTRAVENOUS | Status: DC
  Administered 2015-12-17: 09:00:00 via INTRAVENOUS

## 2015-12-17 MED ORDER — ACETAMINOPHEN 650 MG RE SUPP: 650.0000 mg | Freq: Four times a day (QID) | RECTAL | Status: DC | PRN

## 2015-12-17 MED ORDER — DIATRIZOATE MEGLUMINE & SODIUM 66-10 % PO SOLN: 15.0000 mL | ORAL | Status: DC

## 2015-12-17 MED ORDER — OXYCODONE HCL 5 MG PO TABS
5.0000 mg | ORAL_TABLET | ORAL | Status: DC | PRN
  Filled 2015-12-17: qty 1

## 2015-12-17 MED ORDER — VANCOMYCIN HCL IN DEXTROSE 750-5 MG/150ML-% IV SOLN
750.0000 mg | Freq: Once | INTRAVENOUS | Status: AC
  Administered 2015-12-17: 750 mg via INTRAVENOUS
  Filled 2015-12-17: qty 150

## 2015-12-17 MED ORDER — HEPARIN (PORCINE) IN NACL 100-0.45 UNIT/ML-% IJ SOLN
1000.0000 [IU]/h | INTRAMUSCULAR | Status: DC
  Filled 2015-12-17: qty 250

## 2015-12-17 MED ORDER — PIPERACILLIN-TAZOBACTAM 3.375 G IVPB
3.3750 g | Freq: Two times a day (BID) | INTRAVENOUS | Status: DC
  Administered 2015-12-17 – 2015-12-18 (×2): 3.375 g via INTRAVENOUS
  Filled 2015-12-17 (×2): qty 50

## 2015-12-17 MED ORDER — DOCUSATE SODIUM 100 MG PO CAPS
100.0000 mg | ORAL_CAPSULE | Freq: Every day | ORAL | Status: DC
  Administered 2015-12-18 – 2015-12-19 (×2): 100 mg via ORAL
  Filled 2015-12-17 (×2): qty 1

## 2015-12-17 MED ORDER — DEXTROSE 5 % IV SOLN: 0.0000 ug/min | INTRAVENOUS | Status: DC

## 2015-12-17 MED ORDER — ASPIRIN 81 MG PO CHEW
324.0000 mg | CHEWABLE_TABLET | Freq: Once | ORAL | Status: AC
Start: 2015-12-17 — End: 2015-12-17
  Administered 2015-12-17: 324 mg via ORAL
  Filled 2015-12-17: qty 4

## 2015-12-17 MED ORDER — VANCOMYCIN HCL IN DEXTROSE 1-5 GM/200ML-% IV SOLN
1000.0000 mg | Freq: Once | INTRAVENOUS | Status: AC
  Administered 2015-12-17: 1000 mg via INTRAVENOUS
  Filled 2015-12-17: qty 200

## 2015-12-17 MED ORDER — SODIUM CHLORIDE 0.9% FLUSH: 10.0000 mL | INTRAVENOUS | Status: DC | PRN

## 2015-12-17 MED ORDER — BISACODYL 10 MG RE SUPP: 10.0000 mg | Freq: Every day | RECTAL | Status: DC | PRN

## 2015-12-17 MED ORDER — ONDANSETRON HCL 4 MG PO TABS: 4.0000 mg | ORAL_TABLET | Freq: Four times a day (QID) | ORAL | Status: DC | PRN

## 2015-12-17 MED ORDER — STERILE WATER FOR INJECTION IV SOLN
INTRAVENOUS | Status: DC
  Administered 2015-12-17 – 2015-12-19 (×3): via INTRAVENOUS
  Filled 2015-12-17 (×7): qty 1000

## 2015-12-17 NOTE — ED Notes (Signed)
Pt from [facility].

## 2015-12-17 NOTE — ED Notes (Signed)
Pt go to dialysis on Mondays, wednesdays, and Fridays. Completed dialysis last Wednesday.

## 2015-12-17 NOTE — H&P (Signed)
Theodoro Grist, MD Physician Addendum Internal Medicine H&P  12/23/2015 8:58 AM    Expand All Collapse All   [place] [facility] [place] NAME: Lisa Lowery   MR#: [id number]  DATE OF BIRTH: [birth year]  DATE OF ADMISSION: 12/30/2015  PRIMARY CARE PHYSICIAN: No PCP Per Patient   REQUESTING/REFERRING PHYSICIAN: DR Edd Fabian  CHIEF COMPLAINT:   Chief Complaint  Patient presents with  . Flank Pain  . Shortness of Breath    HISTORY OF PRESENT ILLNESS:  Lisa Lowery is a 75 y.o. female with a known history of Atrial fibrillation, diabetes mellitus, end-stage renal disease who presents to the hospital with complaints of severe abdominal pain in the right upper quadrant of abdomen, right lower chest. Patient tells me that the pain woke her up from the sleep last night , she could not get comfortable until she laid down on the right side, she was nauseated and gagging, and because of significant pain, she decided to come to emergency room for further evaluation. Unfortunately, not able to get much of a history since patient is prostrated, uncomfortable, moaning. Patient admits however that she's been having nausea and vomiting episodes since approximately 1 week ago, however, developed pain just today. In emergency room, patient underwent CT scan of chest with IV contrast due to concerns of pulmonary embolism, however free air pockets were noted, in abdomen, pleural effusions, but no pulmonary embolism, hospitalist services were contacted for consultation.  PAST MEDICAL HISTORY:   Past Medical History  Diagnosis Date  . Vertigo   . Hypertension   . Diabetes ([place])   . A-fib ([place])   . Multiple myeloma ([place])   . Renal insufficiency     PAST SURGICAL HISTOIRY:   Past Surgical History  Procedure Laterality Date  . Small intestine surgery    . Abdominal hysterectomy    . Peripheral vascular  catheterization N/A 11/19/2015    Procedure: Dialysis/Perma Catheter Insertion; Surgeon: Katha Cabal, MD; Location: [facility]; Service: Cardiovascular; Laterality: N/A;    SOCIAL HISTORY:   Social History  Substance Use Topics  . Smoking status: Never Smoker   . Smokeless tobacco: Never Used  . Alcohol Use: No    FAMILY HISTORY:   Family History  Problem Relation Age of Onset  . Cancer Mother   . Cancer Father   . Diabetes Sister     DRUG ALLERGIES:  No Known Allergies  REVIEW OF SYSTEMS:  Patient admits of nausea and vomiting for the past one week, significant right upper quadrant abdominal pain since last night, not able to otherwise provide any history due to prostration, discomfort, acute illness  MEDICATIONS AT HOME:   Prior to Admission medications   Medication Sig Start Date End Date Taking? Authorizing Provider  albuterol (PROVENTIL) (2.5 MG/3ML) 0.083% nebulizer solution Take 2.5 mg by nebulization every 4 (four) hours as needed for wheezing or shortness of breath.   Yes Historical Provider, MD  barrier cream (NON-SPECIFIED) CREA Apply 1 application topically 3 (three) times daily.   Yes Historical Provider, MD  docusate sodium (COLACE) 100 MG capsule Take 100 mg by mouth at bedtime.   Yes Historical Provider, MD  furosemide (LASIX) 20 MG tablet Take 20 mg by mouth 2 (two) times daily.   Yes Historical Provider, MD  Nutritional Supplements (FEEDING SUPPLEMENT, NEPRO CARB STEADY,) LIQD Take 237 mLs by mouth 2 (two) times daily between meals. 11/24/15  Yes Bettey Costa, MD  ondansetron (  ZOFRAN) 4 MG tablet Take 4 mg by mouth 2 (two) times daily.   Yes Historical Provider, MD     VITAL SIGNS:  Blood pressure 85/61, pulse 111, temperature 97.6 F (36.4 C), temperature source Oral, resp. rate 19, SpO2 98 %.  PHYSICAL EXAMINATION:  GENERAL: 75  y.o.-year-old patient lying in the bedIn moderate to severe distress due to abdominal pain, moaning, prostrated  EYES: Pupils equal, round, reactive to light and accommodation. No scleral icterus. Extraocular muscles intact.  HEENT: Head atraumatic, normocephalic. Oropharynx and nasopharynx clear.  NECK: Supple, no jugular venous distention. No thyroid enlargement, no tenderness.  LUNGS: Normal breath sounds bilaterally, diminished at bases, no wheezing, rales,rhonchi or crepitation. Intermittent use of accessory muscles of respiration, especially with exertion or talking.  CARDIOVASCULAR: S1, S2 , irregularly irregular, tachycardic. No murmurs, rubs, or gallops.  ABDOMEN: Moderately firm, tender diffusely, some rebound, guarding was noted diffusely in all abdomen, mostly in the right upper quadrant, epigastric area, nondistended. Bowel sounds present, diminished. No organomegaly or mass.  EXTREMITIES: Trace pedal edema, no cyanosis, or clubbing.  NEUROLOGIC: Cranial nerves II through XII are intact. Muscle strength 5/5 in all extremities. Sensation intact. Gait not checked.  PSYCHIATRIC: The patient is somnolent, but able to open her eyes, converses briefly, oriented 3 SKIN: No obvious rash, lesion, or ulcer.   LABORATORY PANEL:   CBC  Last Labs      Recent Labs Lab 12/31/2015 0620  WBC 8.7  HGB 11.3*  HCT 35.4  PLT 205     ------------------------------------------------------------------------------------------------------------------  Chemistries   Last Labs      Recent Labs Lab 12/16/2015 0620  NA 133*  K 4.2  CL 100*  CO2 23  GLUCOSE 89  BUN 31*  CREATININE 5.51*  CALCIUM 8.1*  AST 261*  ALT 148*  ALKPHOS 79  BILITOT 1.0     ------------------------------------------------------------------------------------------------------------------  Cardiac Enzymes  Last Labs      Recent Labs Lab 12/22/2015 0620    TROPONINI 0.53*     ------------------------------------------------------------------------------------------------------------------  RADIOLOGY:   Imaging Results (Last 48 hours)    Dg Chest Portable 1 View  12/19/2015 CLINICAL DATA: 75 year old female with atrial fibrillation, diabetes, and end-stage renal disease on dialysis with chest pain and shortness of breath EXAM: PORTABLE CHEST 1 VIEW COMPARISON: Prior chest x-ray 11/14/2015 FINDINGS: Right IJ approach tunneled hemodialysis catheter. The catheter tip projects over the distal SVC. Stable cardiomegaly and mediastinal contours. There are small to moderate bilateral layering pleural effusions. Mild vascular congestion without overt edema. Bibasilar atelectasis. No evidence of pneumothorax. Osseous structures are intact and unremarkable. Atherosclerotic calcifications again noted in the transverse aorta. IMPRESSION: Right IJ approach tunneled hemodialysis catheter. The catheter tip overlies the distal SVC. Stable cardiomegaly and bilateral layering pleural effusions with associated bibasilar atelectasis. Electronically Signed By: Jacqulynn Cadet M.D. On: 01/09/2016 08:00   US Abdomen Limited Ruq  12/21/2015 CLINICAL DATA: Right chest pain, shortness of breath. EXAM: US ABDOMEN LIMITED - RIGHT UPPER QUADRANT COMPARISON: 11/16/2015 FINDINGS: Gallbladder: Single large stone again noted within the gallbladder, measuring up to 2.1 cm. No wall thickening. Negative sonographic Murphy's. Common bile duct: Diameter: Normal caliber, 3 mm. Liver: No focal lesion identified. Within normal limits in parenchymal echogenicity. Incidentally noted is mild perihepatic ascites and right effusion IMPRESSION: Cholelithiasis. No sonographic evidence of acute cholecystitis. Right effusion. Perihepatic ascites. Electronically Signed By: Rolm Baptise M.D. On: 12/31/2015 07:19     EKG:   Orders placed or performed during the hospital  encounter of  12/28/2015  . ED EKG  . ED EKG  . EKG 12-Lead  . EKG 12-Lead    IMPRESSION AND PLAN:    Active Problems:  RUQ pain  Atrial fibrillation with RVR (HCC)  Hypotension  Dyspnea  Hypoxia  Nausea  ESRD (end stage renal disease) ([place]) #1. Right upper quadrant abdominal pain due to perforated viscus, initiate patient on Zosyn, get surgical consultation, needs a CT of abdomen and pelvis? #2. Sepsis, blood cultures, initiate broad-spectrum antibiotic therapy, Zosyn and vancomycin, adjust antibiotics depending on culture results #3. Hypoxia, continue oxygen therapy as needed to keep pulse oximetry at 94% and above #4 hypotension, continue IV fluids with boluses if needed #5. Nausea, supportive therapy, IV fluids #6. Dyspnea, supportive therapy, oxygen, morphine as needed #7. End-stage renal disease, nephrology consultation would be advised #8. Diabetes mellitus type 2. Continue sliding scale insulin, nothing by mouth #. A. fib, RVR, IV fluids, supportive therapy, will use amiodarone as needed for heart rate control, but I suspect heart rate is high due to metabolic acidosis, pain, stress, hypotension, sepsis   All the records are reviewed and case discussed with Consulting provider. Management plans discussed with the patient, family and they are in agreement.  CODE STATUS: Full code  TOTAL CRITICAL CARE TIME TAKING CARE OF THIS PATIENT: 55 minutes.    Theodoro Grist M.D on 01/02/2016 at 8:58 AM  Between 7am to 6pm - Pager - [phone]  After 6pm go to [email or web address] - password [email or web address] [facility]  Office [phone]  CC: Primary care Physician: No PCP Per Patient          Revision History     Date/Time User Provider Type Action   12/16/2015 9:10 AM Theodoro Grist, MD Physician Addend   12/14/2015 9:08 AM Theodoro Grist, MD Physician Sign   View Details Report       Routing History     Date/Time From To Method    12/27/2015 9:10 AM Theodoro Grist, MD No Pcp Per Patient In Basket

## 2015-12-17 NOTE — Progress Notes (Signed)
Notifed Dr. Mortimer Fries about results of CT- he stated that he spoke with Dr. Dahlia Byes and that they will treat patient medically at this time.  Dr. Mortimer Fries and Dr. Ether Griffins made aware that  patient is in afib on heart monitor- controlled in low 100's but can go up to 120's at times.  Patient is not on any medicines to control afib or blood thinners.  Per MD's- medicines on hold at this time.

## 2015-12-17 NOTE — Progress Notes (Signed)
75 yo female presented w SOB and flank pain on to have some specks of free air on a CT of the chest. Just underwent a CT of the abdomen and pelvis with contrast and there is some small amount of free air and more importantly there is cirrhosis with some varices. There is no evidence of contrast extravasation on CT. Seems to be related to a possible perforated duodenal ulcer. No evidence of pneumatosis intestinalis and bowel does not seem to be ischemic. She is very debilitated, elderly and with presumed multiple myeloma that has not been treated. She is End-stage renal disease and is on hemodialysis with an appendectomy many less than 2 and an INR of 1.6. There is some component of ascites as well. This will put her on  a child's B classification but more importantly given her other comorbidities including hypoxemia, bilateral pleural effusion and active myeloma her perioperative mortality is prohibitive. D/W her in detail, she seems to understands. Currently her BP is better after resuscitation, she is mildly tender but w/o peritonitis. After a d/w the pt she chooses not to have surgical intervention. I do think this is the most prudent decision given her prohibitive operative risks. Medical rx recommended w NGT, broad spectrum antibiotics, resuscitation. May consider palliative care if pt deteriorates.  When speaking top the son he states that over the last few months she has " given up" she never seeked treatment for the myeloma and this indirectly speaks on how aggressive she wishes to be with her healthcare decisions.  D/W Dr. Mortimer Fries in detail

## 2015-12-17 NOTE — ED Notes (Signed)
Patient placed on 6L O2 Lisa Lowery with Sat of 94%

## 2015-12-17 NOTE — Progress Notes (Signed)
[facility]  ROUNDING NOTE   Subjective:  Patient well-known to [place] from prior admission. She has underlying multiple myeloma for which she declined hematology/oncology evaluation. She recently was felt to have acute kidney injury however it is unlikely that her acute kidney injury will improve given persistent underlying multiple myeloma. She now presents with shortness of breath and abdominal pain. There appears to be some mild amount of free air around her liver based upon CT chest looking for pulmonary embolism. Surgery has evaluated her and currently it is unclear as to further course of action given her severe underlying comorbidities.   Objective:  Vital signs in last 24 hours:  Temp:  [97.6 F (36.4 C)] 97.6 F (36.4 C) (05/05 1042) Pulse Rate:  [96-129] 117 (05/05 1130) Resp:  [18-33] 18 (05/05 1130) BP: (78-125)/(41-88) 98/60 mmHg (05/05 1130) SpO2:  [89 %-98 %] 91 % (05/05 1130) Weight:  [92.3 kg (203 lb 7.8 oz)] 92.3 kg (203 lb 7.8 oz) (05/05 1042)  Weight change:  Filed Weights   12/13/2015 1042  Weight: 92.3 kg (203 lb 7.8 oz)    Intake/Output:     Intake/Output this shift:  Total I/O In: 100 [I.V.:100] Out: -   Physical Exam: General: Critically ill appearing  Head: Normocephalic, atraumatic. Dry oral mucosal membranes  Eyes: Anicteric  Neck: Supple, trachea midline  Lungs:  Diminished BS at bases, slightly increased work of breathing  Heart: S1S2 no rubs irregular tachycardic  Abdomen:  Some rigidity noted anteriorly, tender in b/l upper quadrants   Extremities: 2+ peripheral edema.  Neurologic: Nonfocal, moving all four extremities  Skin: No lesions  Access: IJ PC in place    Basic Metabolic Panel:  Recent Labs Lab 12/14/2015 0620  NA 133*  K 4.2  CL 100*  CO2 23  GLUCOSE 89  BUN 31*  CREATININE 5.51*  CALCIUM 8.1*    Liver Function Tests:  Recent Labs Lab 12/27/2015 0620  AST 261*  ALT 148*  ALKPHOS 79  BILITOT 1.0   PROT 8.3*  ALBUMIN 1.7*    Recent Labs Lab 01/08/2016 0620  LIPASE 17   No results for input(s): AMMONIA in the last 168 hours.  CBC:  Recent Labs Lab 12/30/2015 0620  WBC 8.7  NEUTROABS 6.2  HGB 11.3*  HCT 35.4  MCV 95.0  PLT 205    Cardiac Enzymes:  Recent Labs Lab 01/02/2016 0620  TROPONINI 0.53*    BNP: Invalid input(s): POCBNP  CBG:  Recent Labs Lab 01/12/2016 1039  GLUCAP 81    Microbiology: Results for orders placed or performed during the hospital encounter of 11/10/15  Urine culture     Status: None   Collection Time: 11/11/15  3:44 AM  Result Value Ref Range Status   Specimen Description URINE, RANDOM  Final   Special Requests NONE  Final   Culture MULTIPLE SPECIES PRESENT, SUGGEST RECOLLECTION  Final   Report Status 11/13/2015 FINAL  Final  Culture, body fluid-bottle     Status: None   Collection Time: 11/11/15 11:45 AM  Result Value Ref Range Status   Specimen Description PLEURAL  Final   Special Requests NONE  Final   Gram Stain FEW WBC SEEN NO ORGANISMS SEEN   Final   Culture NO GROWTH 5 DAYS  Final   Report Status 11/16/2015 FINAL  Final    Coagulation Studies:  Recent Labs  12/22/2015 0620  LABPROT 19.3*  INR 1.62    Urinalysis:  Recent Labs  12/26/2015 0962  COLORURINE RED*  LABSPEC 1.034*  PHURINE 5.0  GLUCOSEU NEGATIVE  HGBUR 1+*  BILIRUBINUR NEGATIVE  KETONESUR NEGATIVE  PROTEINUR 100*  NITRITE NEGATIVE  LEUKOCYTESUR 2+*      Imaging: Dg Abd 1 View  12/27/2015  CLINICAL DATA:  NG tube placement EXAM: ABDOMEN - 1 VIEW COMPARISON:  11/16/2015 FINDINGS: There is NG tube in place with tip in proximal stomach. Gaseous distended small bowel loops are noted mid abdomen suspicious for ileus or partial small bowel obstruction. IMPRESSION: NG tube with tip in proximal stomach. Gaseous distended small bowel loops mid abdomen suspicious for ileus or partial bowel obstruction Electronically Signed   By: Lahoma Crocker M.D.   On:  12/19/2015 11:37   Ct Angio Chest Pe W/cm &/or Wo Cm  12/27/2015  CLINICAL DATA:  Possible pulmonary embolus, end-stage renal disease, shortness of Breath EXAM: CT ANGIOGRAPHY CHEST WITH CONTRAST TECHNIQUE: Multidetector CT imaging of the chest was performed using the standard protocol during bolus administration of intravenous contrast. Multiplanar CT image reconstructions and MIPs were obtained to evaluate the vascular anatomy. CONTRAST:  80 cc Isovue COMPARISON:  None. FINDINGS: Mediastinum/Lymph Nodes: Cardiomegaly is noted. Images of the thoracic inlet are unremarkable. Central airways are patent. Atherosclerotic calcifications of thoracic aorta. No pulmonary embolus is noted. No pericardial effusion. No hilar adenopathy is noted. A right IJ dialysis catheter is noted. Lungs/Pleura: There is bilateral moderate size pleural effusion right greater than left. Atelectasis noted bilateral lower lobe. Mild atelectasis noted in lingula. There is no convincing pulmonary edema. Upper abdomen: There is small perihepatic ascites. Free air is noted in right upper abdomen anteriorly axial images 110 and 127. Although findings may be due to recent instrumentation perforated viscus cannot be excluded. Clinical correlation is necessary. Musculoskeletal: No destructive bony lesions are noted. Degenerative changes thoracic spine. Sagittal view of the sternum is unremarkable. No destructive rib lesions are noted. Review of the MIP images confirms the above findings. IMPRESSION: 1. No pulmonary embolus is noted. 2. No mediastinal hematoma or adenopathy. 3. Cardiomegaly is noted. 4. Bilateral moderate size pleural effusion right greater than left. Bilateral lower lobe atelectasis. Mild atelectasis noted in lingula. No convincing pulmonary edema. 5. Small perihepatic ascites. There is free air in right upper abdomen anteriorly. Although this may be due to recent instrumentation perforated viscus cannot be excluded. Clinical  correlation is necessary. These results were called by telephone at the time of interpretation on 01/12/2016 at 8:57 am to Dr. Loura Pardon , who verbally acknowledged these results. Electronically Signed   By: Lahoma Crocker M.D.   On: 12/28/2015 08:57   Dg Chest Portable 1 View  12/24/2015  CLINICAL DATA:  75 year old female with atrial fibrillation, diabetes, and end-stage renal disease on dialysis with chest pain and shortness of breath EXAM: PORTABLE CHEST 1 VIEW COMPARISON:  Prior chest x-ray 11/14/2015 FINDINGS: Right IJ approach tunneled hemodialysis catheter. The catheter tip projects over the distal SVC. Stable cardiomegaly and mediastinal contours. There are small to moderate bilateral layering pleural effusions. Mild vascular congestion without overt edema. Bibasilar atelectasis. No evidence of pneumothorax. Osseous structures are intact and unremarkable. Atherosclerotic calcifications again noted in the transverse aorta. IMPRESSION: Right IJ approach tunneled hemodialysis catheter. The catheter tip overlies the distal SVC. Stable cardiomegaly and bilateral layering pleural effusions with associated bibasilar atelectasis. Electronically Signed   By: Jacqulynn Cadet M.D.   On: 12/22/2015 08:00   US Abdomen Limited Ruq  12/22/2015  CLINICAL DATA:  Right chest pain, shortness of breath.  EXAM: US ABDOMEN LIMITED - RIGHT UPPER QUADRANT COMPARISON:  11/16/2015 FINDINGS: Gallbladder: Single large stone again noted within the gallbladder, measuring up to 2.1 cm. No wall thickening. Negative sonographic Murphy's. Common bile duct: Diameter: Normal caliber, 3 mm. Liver: No focal lesion identified. Within normal limits in parenchymal echogenicity. Incidentally noted is mild perihepatic ascites and right effusion IMPRESSION: Cholelithiasis.  No sonographic evidence of acute cholecystitis. Right effusion.  Perihepatic ascites. Electronically Signed   By: Rolm Baptise M.D.   On: 01/02/2016 07:19     Medications:    . sodium chloride 100 mL/hr at 01/12/2016 0844   . antiseptic oral rinse  7 mL Mouth Rinse BID  . barrier cream  1 application Topical TID  . diatrizoate meglumine-sodium  15 mL Oral Q1 Hr x 2  . docusate sodium  100 mg Oral QHS  . insulin aspart  0-5 Units Subcutaneous QHS  . insulin aspart  0-9 Units Subcutaneous TID WC  . sodium chloride flush  3 mL Intravenous Q12H   acetaminophen **OR** acetaminophen, albuterol, bisacodyl, morphine injection, ondansetron **OR** ondansetron (ZOFRAN) IV, oxyCODONE, sodium phosphate  Assessment/ Plan:  75 y.o. female with atrial fibrillation, diabetes mellitus type II, hypertension, vertigo, coronary artery disease, who was admitted to [facility] on 11/10/2015 with left pleural effusion. Status post left thoracentesis on 11/11/15  1.  ESRD on HD due to untreated multiple myeloma/[facility]/MWF 3rd -  Patient last had dialysis on Wednesday.  She is currently hemodynamically unstable. We may need to consider continuous renal replacement therapy but would like to hold off for now until she is resuscitated a bit.  She also appears to have possible free intraperitoneal airand is being evaluated by surgery.  2. Hypotension.  Likely related to potential sepsis andfree intraperitoneal abdominal area.  The patient has been started on antibiotic therapy.  Consider pressors to maintain map of 65 if pressure drops.   3.  Anemia of CKD/multiple myeloma.  Hemoglobin currently 11.3, she has declined treatment for myeloma.  Hold off on epogen at this time.   4.  Possible free intraperitoneal air:  Pt to have a dedicated abdominal CT to further delineate the possibility of free air seen on CT chest.     LOS: 0 Orene Abbasi 5/5/201711:49 AM

## 2015-12-17 NOTE — ED Provider Notes (Addendum)
[facility] Emergency Department Provider Note   ____________________________________________  Time seen: Seen upon arrival to the emergency department  I have reviewed the triage vital signs and the nursing notes.   HISTORY  Chief Complaint Flank Pain and Shortness of Breath    HPI Lisa Lowery is a 75 y.o. female with a history of atrial fibrillation, diabetes and end-stage renal disease on dialysis who is presenting to the emergency departmentwith shortness of breath as well as right-sided chest pain radiating up to her shoulder. She denies any nausea or vomiting. Describes the pain as cramping. Denies any nausea or vomiting. Says it has been worsening throughout the night. Says that she occasionally makes urine. Denies any diarrhea. Does not wear home oxygen. Last dialysis was one day ago.   Past Medical History  Diagnosis Date  . Vertigo   . Hypertension   . Diabetes ([place])   . A-fib [facility])     Patient Active Problem List   Diagnosis Date Noted  . Malnutrition of moderate degree 11/20/2015  . Pleural effusion, left 11/10/2015  . General weakness 11/10/2015  . DM2 (diabetes mellitus, type 2) ([place]) 11/10/2015  . Atrial fibrillation with RVR ([place]) 09/11/2015  . Essential hypertension, malignant 02/23/2015  . Renal insufficiency 02/23/2015  . DM (diabetes mellitus) type 2, uncontrolled, with ketoacidosis ([place]) 02/23/2015  . Dizziness 02/21/2015  . Elevated troponin 02/21/2015  . HTN (hypertension) 02/21/2015  . Elevated blood sugar 02/21/2015    Past Surgical History  Procedure Laterality Date  . Small intestine surgery    . Abdominal hysterectomy    . Peripheral vascular catheterization N/A 11/19/2015    Procedure: Dialysis/Perma Catheter Insertion;  Surgeon: Katha Cabal, MD;  Location: [facility];  Service: Cardiovascular;  Laterality: N/A;    Current Outpatient Rx  Name  Route  Sig  Dispense  Refill  . metoprolol  tartrate (LOPRESSOR) 25 MG tablet   Oral   Take 0.5 tablets (12.5 mg total) by mouth 2 (two) times daily.   60 tablet   0   . Multiple Vitamin (MULTIVITAMIN WITH MINERALS) TABS tablet   Oral   Take 1 tablet by mouth daily.   30 tablet   0   . Nutritional Supplements (FEEDING SUPPLEMENT, NEPRO CARB STEADY,) LIQD   Oral   Take 237 mLs by mouth 2 (two) times daily between meals.   1000 mL   0     Allergies Review of patient's allergies indicates no known allergies.  Family History  Problem Relation Age of Onset  . Cancer Mother   . Cancer Father   . Diabetes Sister     Social History Social History  Substance Use Topics  . Smoking status: Never Smoker   . Smokeless tobacco: Never Used  . Alcohol Use: No    Review of Systems Constitutional: No fever/chills Eyes: No visual changes. ENT: No sore throat. Cardiovascular:As above Respiratory: As above Gastrointestinal: Right-sided flank pain No nausea, no vomiting.  No diarrhea.  No constipation. Genitourinary: Negative for dysuria. Musculoskeletal: Negative for back pain. Skin: Negative for rash. Neurological: Negative for headaches, focal weakness or numbness.  10-point ROS otherwise negative.  ____________________________________________   PHYSICAL EXAM:  VITAL SIGNS: ED Triage Vitals  Enc Vitals Group     BP --      Pulse --      Resp --      Temp --      Temp src --  SpO2 --      Weight --      Height --      Head Cir --      Peak Flow --      Pain Score --      Pain Loc --      Pain Edu? --      Excl. in [place]? --     Constitutional: Alert and oriented. Appears uncomfortable Eyes: Conjunctivae are normal. PERRL. EOMI. Head: Atraumatic. Nose: No congestion/rhinnorhea. Mouth/Throat: Mucous membranes are moist.  Oropharynx non-erythematous. Neck: No stridor.   Cardiovascular: Normal rate, regular rhythm. Grossly normal heart sounds.  Good peripheral circulation. Respiratory: Normal  respiratory effort.  No retractions. Decreased lung sounds to the lower fields bilaterally. Gastrointestinal: Soft with mild right upper quadrant tenderness to palpation.. No distention.No CVA tenderness. Musculoskeletal: Bilateral lower extremity edema is moderate and appears chronic. No joint effusions. Neurologic:  Normal speech and language. No gross focal neurologic deficits are appreciated.  Skin:  Skin is warm, dry and intact. No rash noted. Psychiatric: Mood and affect are normal. Speech and behavior are normal.  ____________________________________________   LABS (all labs ordered are listed, but only abnormal results are displayed)  Labs Reviewed  CBC WITH DIFFERENTIAL/PLATELET - Abnormal; Notable for the following:    RBC 3.72 (*)    Hemoglobin 11.3 (*)    RDW 19.4 (*)    All other components within normal limits  COMPREHENSIVE METABOLIC PANEL - Abnormal; Notable for the following:    Sodium 133 (*)    Chloride 100 (*)    BUN 31 (*)    Creatinine, Ser 5.51 (*)    Calcium 8.1 (*)    Total Protein 8.3 (*)    Albumin 1.7 (*)    AST 261 (*)    ALT 148 (*)    GFR calc non Af Amer 7 (*)    GFR calc Af Amer 8 (*)    All other components within normal limits  TROPONIN I - Abnormal; Notable for the following:    Troponin I 0.53 (*)    All other components within normal limits  LIPASE, BLOOD  URINALYSIS COMPLETEWITH MICROSCOPIC ([facility] ONLY)  PROTIME-INR  APTT   ____________________________________________  EKG ED ECG REPORT I, Doran Stabler, the attending physician, personally viewed and interpreted this ECG.   Date: 01/06/2016  EKG Time: 627  Rate: 114  Rhythm: atrial fibrillation, rate 114  Axis: Normal axis  Intervals:none  ST&T Change: 1 mm ST depression in V5 and V6 with associated T-wave inversions. T-wave inversion in aVF. No definite inversion in aVR. Possibly baseline related. Also with T wave inversion in  1.   ____________________________________________  RADIOLOGY  US Abdomen Limited RUQ (Final result) Result time: 12/23/2015 07:19:04   Final result by Rad Results In Interface (01/05/2016 07:19:04)   Narrative:   CLINICAL DATA: Right chest pain, shortness of breath.  EXAM: US ABDOMEN LIMITED - RIGHT UPPER QUADRANT  COMPARISON: 11/16/2015  FINDINGS: Gallbladder:  Single large stone again noted within the gallbladder, measuring up to 2.1 cm. No wall thickening. Negative sonographic Murphy's.  Common bile duct:  Diameter: Normal caliber, 3 mm.  Liver:  No focal lesion identified. Within normal limits in parenchymal echogenicity.  Incidentally noted is mild perihepatic ascites and right effusion  IMPRESSION: Cholelithiasis. No sonographic evidence of acute cholecystitis.  Right effusion. Perihepatic ascites.   Electronically Signed By: Rolm Baptise M.D. On: 01/02/2016 07:19       ____________________________________________  PROCEDURES    ____________________________________________   INITIAL IMPRESSION / ASSESSMENT AND PLAN / ED COURSE  Pertinent labs & imaging results that were available during my care of the patient were reviewed by me and considered in my medical decision making (see chart for details).  ----------------------------------------- 7:25 AM on 12/18/2015 -----------------------------------------  Patient now with an episode of hypotension. Also with need for 4 L nasal cannula oxygen. Concern for pulmonary embolus. Called the CAT scan tech to expedite this. Patient will need stat CAT scan.  Elevated troponin as well from previous. Concern for pulmonary embolus versus non-ST elevation myocardial infarction. ST depressions appear new in V5 and V6 when compared with the EKG from March of this year. Patient with end-stage renal disease on dialysis. Will require CAT scan with contrast. Signed out to Dr. Edd Fabian.    ____________________________________________   FINAL CLINICAL IMPRESSION(S) / ED DIAGNOSES  Chest pain. Hypoxia. Shortness of breath.    NEW MEDICATIONS STARTED DURING THIS VISIT:  New Prescriptions   No medications on file     Note:  This document was prepared using Dragon voice recognition software and may include unintentional dictation errors.    Orbie Pyo, MD 01/08/2016 [phone]  Patient is only dialyzed as needed and so nephrology, Dr. Holley Raring, recommends that she not receive a CAT scan with contrast. We will start on heparin and obtain a VQ scan. The patient is aware of the need for admission to the hospital. Signed out to Dr. Tressia Miners.    Addendum to physical exam above. Right-sided chest permacath without any surrounding erythema, induration or pus.  Orbie Pyo, MD 01/07/2016 0745  Treating for presumed PE.    Orbie Pyo, MD 12/15/2015 [phone]

## 2015-12-17 NOTE — ED Provider Notes (Addendum)
-----------------------------------------   7:53 AM on 12/16/2015 -----------------------------------------  Received phone call from Dr. Holley Raring of Nephrology who has newly acquired information. He reports that the patient was recently diagnosed as end-stage renal disease and is unlikely to recover any renal function, therefore no contraindications to pursuing CTA chest to rule out PE.  ----------------------------------------- 9:11 AM on 12/29/2015 -----------------------------------------  CT imaging of chest shows no PE. Bilateral moderate-sized pleural effusions are noted as well as some free air in the right upper abdomen. I discussed the case with Dr. Clerance Lav of general surgery who will evaluate the patient. We'll give IV fluids, IV vancomycin and Zosyn as her clinical picture could at this point represent sepsis secondary to intra-abdominal infection/perforation of viscus.  Joanne Gavel, MD 01/05/2016 [id number]  Joanne Gavel, MD 01/01/2016 [id number]  Joanne Gavel, MD 12/18/15 1452

## 2015-12-17 NOTE — Consult Note (Signed)
[facility] Consultation      Name: Lisa Lowery MRN: [id number] DOB: 1940-11-25    ADMISSION DATE:  12/23/2015 CONSULTATION DATE:  12/30/2015 REFERRING MD :  Dr Ether Griffins  CHIEF COMPLAINT:  Acute resp distress and abd pain   HISTORY OF PRESENT ILLNESS  75 y.o. femalewith  Diabetes, multiple biloma, deconditioning, malnutrition, a fib.  -Came to the emergency room complaining of some right upper quadrant discomfort some chest pain radiated to the retrosternal area and to the back.  -The pains are intermittent and moderate in severity. She did have some nausea and decreased appetite.  - she has been bedridden for the last few months. She has been very weak - She recently was diagnosed with presumed multiple myeloma but refused a bone marrow biopsy  - Currently she is DO NOT RESUSCITATE and DO NOT INTUBATE but will reverse code status if she were to go to OR,  -there is a chance of likely perforated viscus -when I asked her about any possible operation to save her life she stated that she'll we'll be willing to consider it    PAST MEDICAL HISTORY    :  Past Medical History  Diagnosis Date  . Vertigo   . Hypertension   . Diabetes ([place])   . A-fib ([place])   . Multiple myeloma ([place])   . Renal insufficiency    Past Surgical History  Procedure Laterality Date  . Small intestine surgery    . Abdominal hysterectomy    . Peripheral vascular catheterization N/A 11/19/2015    Procedure: Dialysis/Perma Catheter Insertion;  Surgeon: Katha Cabal, MD;  Location: [facility];  Service: Cardiovascular;  Laterality: N/A;   Prior to Admission medications   Medication Sig Start Date End Date Taking? Authorizing Provider  albuterol (PROVENTIL) (2.5 MG/3ML) 0.083% nebulizer solution Take 2.5 mg by nebulization every 4 (four) hours as needed for wheezing or shortness of breath.   Yes Historical Provider, MD  barrier cream (NON-SPECIFIED) CREA Apply 1 application  topically 3 (three) times daily.   Yes Historical Provider, MD  docusate sodium (COLACE) 100 MG capsule Take 100 mg by mouth at bedtime.   Yes Historical Provider, MD  furosemide (LASIX) 20 MG tablet Take 20 mg by mouth 2 (two) times daily.   Yes Historical Provider, MD  Nutritional Supplements (FEEDING SUPPLEMENT, NEPRO CARB STEADY,) LIQD Take 237 mLs by mouth 2 (two) times daily between meals. 11/24/15  Yes Sital Mody, MD  ondansetron (ZOFRAN) 4 MG tablet Take 4 mg by mouth 2 (two) times daily.   Yes Historical Provider, MD   No Known Allergies   FAMILY HISTORY   Family History  Problem Relation Age of Onset  . Cancer Mother   . Cancer Father   . Diabetes Sister       SOCIAL HISTORY    reports that she has never smoked. She has never used smokeless tobacco. She reports that she does not drink alcohol or use illicit drugs.  Review of Systems  Constitutional: Positive for malaise/fatigue and diaphoresis.  HENT: Negative for congestion.   Eyes: Negative for blurred vision.  Respiratory: Positive for shortness of breath. Negative for cough, hemoptysis, sputum production and wheezing.   Cardiovascular: Positive for leg swelling. Negative for chest pain, palpitations and orthopnea.  Gastrointestinal: Positive for nausea, vomiting and abdominal pain.  Genitourinary: Negative for dysuria.  Musculoskeletal: Positive for back pain.  Skin: Negative for rash.  Neurological: Positive for dizziness and weakness. Negative for  headaches.  Psychiatric/Behavioral: The patient is nervous/anxious.       VITAL SIGNS    Temp:  [97.6 F (36.4 C)] 97.6 F (36.4 C) (05/05 1042) Pulse Rate:  [96-129] 115 (05/05 1300) Resp:  [18-33] 18 (05/05 1300) BP: (78-125)/(41-88) 95/67 mmHg (05/05 1300) SpO2:  [89 %-98 %] 95 % (05/05 1300) Weight:  [203 lb 7.8 oz (92.3 kg)] 203 lb 7.8 oz (92.3 kg) (05/05 1042) HEMODYNAMICS:   VENTILATOR SETTINGS:   INTAKE / OUTPUT:  Intake/Output Summary (Last 24  hours) at 01/01/2016 1355 Last data filed at 12/25/2015 1320  Gross per 24 hour  Intake    235 ml  Output    500 ml  Net   -265 ml       PHYSICAL EXAM   Physical Exam  Constitutional: She is oriented to person, place, and time. She appears well-developed and well-nourished. She appears distressed.  HENT:  Head: Normocephalic and atraumatic.  Mouth/Throat: No oropharyngeal exudate.  Eyes: EOM are normal. Pupils are equal, round, and reactive to light. No scleral icterus.  Neck: Normal range of motion. Neck supple.  Cardiovascular: Normal rate, regular rhythm and normal heart sounds.   No murmur heard. Pulmonary/Chest: No stridor. She is in respiratory distress. She has no wheezes. She has rales.  Abdominal: She exhibits distension. There is tenderness.  distended  Musculoskeletal: Normal range of motion. She exhibits no edema.  Neurological: She is alert and oriented to person, place, and time. No cranial nerve deficit.  Skin: Skin is warm. She is diaphoretic.  Psychiatric: She has a normal mood and affect.       LABS   LABS:  CBC  Recent Labs Lab 01/07/2016 0620  WBC 8.7  HGB 11.3*  HCT 35.4  PLT 205   Coag's  Recent Labs Lab 12/14/2015 0620  APTT 33  INR 1.62   BMET  Recent Labs Lab 01/03/2016 0620  NA 133*  K 4.2  CL 100*  CO2 23  BUN 31*  CREATININE 5.51*  GLUCOSE 89   Electrolytes  Recent Labs Lab 12/24/2015 0620  CALCIUM 8.1*   Sepsis Markers No results for input(s): LATICACIDVEN, PROCALCITON, O2SATVEN in the last 168 hours. ABG No results for input(s): PHART, PCO2ART, PO2ART in the last 168 hours. Liver Enzymes  Recent Labs Lab 01/01/2016 0620  AST 261*  ALT 148*  ALKPHOS 79  BILITOT 1.0  ALBUMIN 1.7*   Cardiac Enzymes  Recent Labs Lab 01/07/2016 0620  TROPONINI 0.53*   Glucose  Recent Labs Lab 01/10/2016 1039  GLUCAP 81     Recent Results (from the past 240 hour(s))  MRSA PCR Screening     Status: None   Collection  Time: 01/09/2016 11:04 AM  Result Value Ref Range Status   MRSA by PCR NEGATIVE NEGATIVE Final    Comment:        The GeneXpert MRSA Assay (FDA approved for NASAL specimens only), is one component of a comprehensive MRSA colonization surveillance program. It is not intended to diagnose MRSA infection nor to guide or monitor treatment for MRSA infections.      Current facility-administered medications:  .  0.9 %  sodium chloride infusion, , Intravenous, Continuous, Theodoro Grist, MD, Last Rate: 100 mL/hr at 12/22/2015 0844 .  acetaminophen (TYLENOL) tablet 650 mg, 650 mg, Oral, Q6H PRN **OR** acetaminophen (TYLENOL) suppository 650 mg, 650 mg, Rectal, Q6H PRN, Theodoro Grist, MD .  albuterol (PROVENTIL) (2.5 MG/3ML) 0.083% nebulizer solution 2.5 mg, 2.5 mg, Nebulization, Q4H  PRN, Katharina Caper, MD .  antiseptic oral rinse (CPC / CETYLPYRIDINIUM CHLORIDE 0.05%) solution 7 mL, 7 mL, Mouth Rinse, BID, Diego F Pabon, MD, 7 mL at 01/09/2016 1155 .  barrier cream (non-specified) 1 application, 1 application, Topical, TID, Katharina Caper, MD, 1 application at 12/14/2015 1053 .  bisacodyl (DULCOLAX) suppository 10 mg, 10 mg, Rectal, Daily PRN, Katharina Caper, MD .  docusate sodium (COLACE) capsule 100 mg, 100 mg, Oral, QHS, Rima Vaickute, MD .  insulin aspart (novoLOG) injection 0-5 Units, 0-5 Units, Subcutaneous, QHS, Rima Vaickute, MD .  insulin aspart (novoLOG) injection 0-9 Units, 0-9 Units, Subcutaneous, TID WC, Katharina Caper, MD, 0 Units at 12/30/2015 1114 .  morphine 2 MG/ML injection 2 mg, 2 mg, Intravenous, Q4H PRN, Katharina Caper, MD .  ondansetron (ZOFRAN) tablet 4 mg, 4 mg, Oral, Q6H PRN **OR** ondansetron (ZOFRAN) injection 4 mg, 4 mg, Intravenous, Q6H PRN, Katharina Caper, MD, 4 mg at 12/24/2015 1320 .  oxyCODONE (Oxy IR/ROXICODONE) immediate release tablet 5 mg, 5 mg, Oral, Q4H PRN, Katharina Caper, MD .  piperacillin-tazobactam (ZOSYN) IVPB 3.375 g, 3.375 g, Intravenous, Q12H, Katharina Caper, MD .   sodium chloride flush (NS) 0.9 % injection 3 mL, 3 mL, Intravenous, Q12H, Katharina Caper, MD, 3 mL at 12/16/2015 1052 .  sodium phosphate (FLEET) 7-19 GM/118ML enema 1 enema, 1 enema, Rectal, Once PRN, Katharina Caper, MD .  vancomycin (VANCOCIN) IVPB 750 mg/150 ml premix, 750 mg, Intravenous, Once, Katharina Caper, MD, 750 mg at 12/19/2015 1351 .  [START ON 2015-12-25] vancomycin (VANCOCIN) IVPB 750 mg/150 ml premix, 750 mg, Intravenous, Q M,W,F-HD, Katharina Caper, MD  IMAGING    Dg Abd 1 View  01/09/2016  CLINICAL DATA:  NG tube placement EXAM: ABDOMEN - 1 VIEW COMPARISON:  11/16/2015 FINDINGS: There is NG tube in place with tip in proximal stomach. Gaseous distended small bowel loops are noted mid abdomen suspicious for ileus or partial small bowel obstruction. IMPRESSION: NG tube with tip in proximal stomach. Gaseous distended small bowel loops mid abdomen suspicious for ileus or partial bowel obstruction Electronically Signed   By: Natasha Mead M.D.   On: 01/03/2016 11:37   Ct Angio Chest Pe W/cm &/or Wo Cm  12/26/2015  CLINICAL DATA:  Possible pulmonary embolus, end-stage renal disease, shortness of Breath EXAM: CT ANGIOGRAPHY CHEST WITH CONTRAST TECHNIQUE: Multidetector CT imaging of the chest was performed using the standard protocol during bolus administration of intravenous contrast. Multiplanar CT image reconstructions and MIPs were obtained to evaluate the vascular anatomy. CONTRAST:  80 cc Isovue COMPARISON:  None. FINDINGS: Mediastinum/Lymph Nodes: Cardiomegaly is noted. Images of the thoracic inlet are unremarkable. Central airways are patent. Atherosclerotic calcifications of thoracic aorta. No pulmonary embolus is noted. No pericardial effusion. No hilar adenopathy is noted. A right IJ dialysis catheter is noted. Lungs/Pleura: There is bilateral moderate size pleural effusion right greater than left. Atelectasis noted bilateral lower lobe. Mild atelectasis noted in lingula. There is no convincing  pulmonary edema. Upper abdomen: There is small perihepatic ascites. Free air is noted in right upper abdomen anteriorly axial images 110 and 127. Although findings may be due to recent instrumentation perforated viscus cannot be excluded. Clinical correlation is necessary. Musculoskeletal: No destructive bony lesions are noted. Degenerative changes thoracic spine. Sagittal view of the sternum is unremarkable. No destructive rib lesions are noted. Review of the MIP images confirms the above findings. IMPRESSION: 1. No pulmonary embolus is noted. 2. No mediastinal hematoma or adenopathy. 3. Cardiomegaly is noted. 4.  Bilateral moderate size pleural effusion right greater than left. Bilateral lower lobe atelectasis. Mild atelectasis noted in lingula. No convincing pulmonary edema. 5. Small perihepatic ascites. There is free air in right upper abdomen anteriorly. Although this may be due to recent instrumentation perforated viscus cannot be excluded. Clinical correlation is necessary. These results were called by telephone at the time of interpretation on 12/19/2015 at 8:57 am to Dr. Loura Pardon , who verbally acknowledged these results. Electronically Signed   By: Lahoma Crocker M.D.   On: 12/19/2015 08:57   Dg Chest Portable 1 View  01/08/2016  CLINICAL DATA:  75 year old female with atrial fibrillation, diabetes, and end-stage renal disease on dialysis with chest pain and shortness of breath EXAM: PORTABLE CHEST 1 VIEW COMPARISON:  Prior chest x-ray 11/14/2015 FINDINGS: Right IJ approach tunneled hemodialysis catheter. The catheter tip projects over the distal SVC. Stable cardiomegaly and mediastinal contours. There are small to moderate bilateral layering pleural effusions. Mild vascular congestion without overt edema. Bibasilar atelectasis. No evidence of pneumothorax. Osseous structures are intact and unremarkable. Atherosclerotic calcifications again noted in the transverse aorta. IMPRESSION: Right IJ approach  tunneled hemodialysis catheter. The catheter tip overlies the distal SVC. Stable cardiomegaly and bilateral layering pleural effusions with associated bibasilar atelectasis. Electronically Signed   By: Jacqulynn Cadet M.D.   On: 12/24/2015 08:00   US Abdomen Limited Ruq  01/01/2016  CLINICAL DATA:  Right chest pain, shortness of breath. EXAM: US ABDOMEN LIMITED - RIGHT UPPER QUADRANT COMPARISON:  11/16/2015 FINDINGS: Gallbladder: Single large stone again noted within the gallbladder, measuring up to 2.1 cm. No wall thickening. Negative sonographic Murphy's. Common bile duct: Diameter: Normal caliber, 3 mm. Liver: No focal lesion identified. Within normal limits in parenchymal echogenicity. Incidentally noted is mild perihepatic ascites and right effusion IMPRESSION: Cholelithiasis.  No sonographic evidence of acute cholecystitis. Right effusion.  Perihepatic ascites. Electronically Signed   By: Rolm Baptise M.D.   On: 12/15/2015 07:19       MICRO DATA: MRSA PCR NEG Urine  Blood>> Resp   ANTIMICROBIALS:  zosyn 5/5>>> Vancomycin one dose 5/5   ASSESSMENT/PLAN  75 yo white female with multiple medical issues admitted to ICU for acute resp distress from sepsis likley from GI source possible perforated viscus, ileus SBO   PULMONARY Oxygen as needed  CARDIOVASCULAR Sepsis -will consider CVL -will start vasopressors if needed keep MAP>65 -IVF's as prescribed Check LA  RENAL ESRD on HD Follow up nephrology recs  GASTROINTESTINAL CT abd/Pelvis Pending>>assess for perforated viscus  HEMATOLOGIC Follow CBC  INFECTIOUS Empiric abx for presumed perforated viscus -stop Vancomycin -PCR negative    I have personally obtained a history, examined the patient, evaluated laboratory and independently reviewed  imaging results, formulated the assessment and plan and placed orders.  I have personally confirmed that patient is DNR/DNI however, i have explained about reversing status if  she were going to OR and if she comes back on Vent-will proceed with DNR status. Patient agreed and consented to this plan of acttion   The Patient requires high complexity decision making for assessment and support, frequent evaluation and titration of therapies, application of advanced monitoring technologies and extensive interpretation of multiple databases. Critical Care Time devoted to patient care services described in this note is 45 minutes.   Overall, patient is critically ill, prognosis is guarded. Patient at high risk for cardiac arrest and death.    Corrin Parker, M.D.  [facility]  Medical Director  [facility]

## 2015-12-17 NOTE — Consult Note (Signed)
Pharmacy Antibiotic Note  Lisa Lowery is a 75 y.o. female admitted on 12/14/2015 with sepsis.  Pharmacy has been consulted for vancomycin and zosyn dosing. Patient is a HD pt MWF. Nephrology considering switching to CCRT- but holding off for now Plan: Patient received 1g of vancomycin in the ED. Will give an additional 750mg  once for a toal load of 1750mg . Will give 750mg  AFTER HD sessions if pt remains on HD. If pt is transitioned to CCRT, dose will need to be adjusted. Zosyn 3.375 q 12 hours EI dosing. If pt is transitioned to CCRT, dose will need to be adjusted  Height: 5\' 7"  (170.2 cm) Weight: 203 lb 7.8 oz (92.3 kg) IBW/kg (Calculated) : 61.6  Temp (24hrs), Avg:97.6 F (36.4 C), Min:97.6 F (36.4 C), Max:97.6 F (36.4 C)   Recent Labs Lab 12/28/2015 0620  WBC 8.7  CREATININE 5.51*    Estimated Creatinine Clearance: 10.1 mL/min (by C-G formula based on Cr of 5.51).    No Known Allergies  Antimicrobials this admission: vancomycin 5/5 >>  zosyn 5/5 >>   Dose adjustments this admission:   Microbiology results: 5/5 BCx: pending 5/5 UCx: pending   5/5 MRSA PCR: neg  Thank you for allowing pharmacy to be a part of this patient's care.  Ramond Dial, Pharm.D Clinical Pharmacist   12/30/2015 12:42 PM

## 2015-12-17 NOTE — Progress Notes (Signed)
Initial Nutrition Assessment  DOCUMENTATION CODES:   Severe malnutrition in context of acute illness/injury  INTERVENTION:   -Await further determination regarding poc and make further nutritional recommendations at that time  NUTRITION DIAGNOSIS:   Malnutrition related to poor appetite, altered GI function, acute illness, chronic illness as evidenced by severe depletion of body fat, severe depletion of muscle mass, moderate to severe fluid accumulation, energy intake < or equal to 50% for > or equal to 5 days.  GOAL:   Patient will meet greater than or equal to 90% of their needs  MONITOR:   Diet advancement, Labs, Weight trends, I & O's, Skin  REASON FOR ASSESSMENT:   Malnutrition Screening Tool    ASSESSMENT:   75 yo female admitted with severe abdominal; Surgery has been consulted for possible perforation with free intraperitoneal air seen on abdominal CT; Recently diagnosis with presumed multiple myeloma but refused bone marrow biopsy. Pt also ESRD on HD which was initiated on 11/19/15  Pt currently NPO. Pt reports she has been eating only 1 meal a day since initiation of HD. On last admission at the end of March 2017, pt with poor appetite and poor po intake and met clinical characteristics for moderate malnutrition in acute illness.  Pt reports occassionally she drinks a nutritional supplement. Pt reports N/V periodically but daily over the last 5 days.   Past Medical History  Diagnosis Date  . Vertigo   . Hypertension   . Diabetes ([place])   . A-fib ([place])   . Multiple myeloma ([place])   . Renal insufficiency    Diet Order:  Diet NPO time specified  Skin:  Reviewed, no issues  Last BM:  12/13/2015   Labs:    Recent Labs Lab 01/06/2016 0620  NA 133*  K 4.2  CL 100*  CO2 23  BUN 31*  CREATININE 5.51*  CALCIUM 8.1*  GLUCOSE 89     Meds: NS at 50 ml/hr, ss novolog  Nutrition-Focused physical exam completed. Findings are severe fat depletion in orbital region,  severe muscle depletion in temporal region, and moderate to severe edema.    Height:   Ht Readings from Last 1 Encounters:  12/29/2015 _0  (1.702 m)    Weight: pt reports she has lost weight but does not know how much  Wt Readings from Last 1 Encounters:  12/19/2015 203 lb 7.8 oz (92.3 kg)    Ideal Body Weight:  61.4 kg  BMI:  Body mass index is 31.86 kg/(m^2).  Estimated Nutritional Needs:   Kcal:  2100-2500 kcals   Protein:  110-138 g   Fluid:  1000 mL plus UOP  EDUCATION NEEDS:   Education needs no appropriate at this time  [place], [place], [place] [phone] Pager  [phone] Weekend/On-Call Pager

## 2015-12-17 NOTE — Consult Note (Signed)
Patient ID: Lisa Lowery, female   DOB: [birth year], 75 y.o.   MRN: [id number]  HPI Lisa Lowery is a 75 y.o. female asked by the ER to see the pt in consultation. Diabetes, multiple biloma, deconditioning, malnutrition, a fib. Came to the emergency room complaining of some right upper quadrant discomfort some chest pain radiated to the retrosternal area and to the back. The pains are intermittent and moderate in severity. She did have some nausea and decreased appetite. Patient is a very poor historian and its difficult to pinpoint the exact symptomatology. I did talk to his son tells me that she has been bedridden for the last few months. She has been very weak and according to her son "has given up". She recently was diagnosed with presumed multiple myeloma but refused a bone marrow biopsy by our oncology colleagues. Currently she is DO NOT RESUSCITATE and DO NOT INTUBATE but when I asked her about any possible operation to save her life she stated that she'll we'll be willing to consider it  HPI  Past Medical History  Diagnosis Date  . Vertigo   . Hypertension   . Diabetes ([place])   . A-fib ([place])   . Multiple myeloma ([place])   . Renal insufficiency     Past Surgical History  Procedure Laterality Date  . Small intestine surgery    . Abdominal hysterectomy    . Peripheral vascular catheterization N/A 11/19/2015    Procedure: Dialysis/Perma Catheter Insertion;  Surgeon: Katha Cabal, MD;  Location: [facility];  Service: Cardiovascular;  Laterality: N/A;    Family History  Problem Relation Age of Onset  . Cancer Mother   . Cancer Father   . Diabetes Sister     Social History Social History  Substance Use Topics  . Smoking status: Never Smoker   . Smokeless tobacco: Never Used  . Alcohol Use: No    No Known Allergies  Current Facility-Administered Medications  Medication Dose Route Frequency Provider Last Rate Last Dose  . 0.9 %  sodium chloride infusion   Intravenous  Continuous Theodoro Grist, MD 100 mL/hr at 01/01/2016 0844    . diatrizoate meglumine-sodium (GASTROGRAFIN) 66-10 % solution 15 mL  15 mL Oral Q1 Hr x 2 Diego F Pabon, MD   30 mL at 12/30/2015 1021  . insulin aspart (novoLOG) injection 0-5 Units  0-5 Units Subcutaneous QHS Theodoro Grist, MD      . insulin aspart (novoLOG) injection 0-9 Units  0-9 Units Subcutaneous TID WC Theodoro Grist, MD         Review of Systems A 10 point review of systems was asked and was negative except for the information on the HPI  Physical Exam Blood pressure 94/67, pulse 122, temperature 97.6 F (36.4 C), temperature source Oral, resp. rate 26, SpO2 92 %. CONSTITUTIONAL: Debilitated patient in distress, she is dehydrated, malnourished and cachectic EYES: Pupils are equal, round, and reactive to light, Sclera are non-icteric. EARS, NOSE, MOUTH AND THROAT: The oropharynx is clear. The oral mucosa is pink and moist. Hearing is intact to voice. LYMPH NODES:  Lymph nodes in the neck are normal. RESPIRATORY:  Decreased breath sounds, she is tachypneic and hypoxemic CARDIOVASCULAR: Heart tachycardic without murmurs, gallops, or rubs. GI: The abdomen is soft,   diffuse tenderness to palpation. There is some voluntary guarding but there are no overt peritoneal signs   There is no hepatosplenomegaly.  Ascitis GU: Rectal deferred.   MUSCULOSKELETAL: Normal muscle strength and tone. LE  EDEMA  SKIN: Turgor is good and there are no pathologic skin lesions or ulcers. NEUROLOGIC: Motor and sensation is grossly normal. Cranial nerves are grossly intact. PSYCH:  Oriented to person, place and time. Affect is normal.  Data Reviewed I have personally reviewed the patient's imaging, laboratory findings and medical records.    Assessment/ Plan  75 year old female with multiple medical comorbidities and severe malnutrition with hypoalbuminemia of 1.6 or so concerning for multiple myeloma. She does have ascites and large bilateral  pleural effusions likely related to hypoalbuminemia. She currently presents with hypoxia chest pain and some vagal abdominal symptoms. CT of the chest was reviewed with the radiologist initially thought to be a PE and there is some specks of free air air around the liver and also in the mediastinum.. She is currently competent and D in our. She is willing to consider a potential surgery if this will save her life. First order of business will be to resuscitate her to improve her perfusion and oxygenation and we will obtain a CT scan of the abdomen and pelvis to have a better idea of a potential abdominal catastrophe. She is at very high surgical risk with almost  prohibitive perioperative mortality rate. Discussed with the patient and with his son in detail and they understand. First and also they want to know what is going on in her abdominal cavity before making any final decisions. We will wait for the CT scan of the abdomen and pelvis to make a more informed decision of the next that is. For now we'll keep her comfortable, start IV antibiotics and keep her nothing by mouth resuscitate her and reassess. I will discuss the case in detail with Dr. Mortimer Fries from the critical care team Caroleen Hamman, MD [employer] General Surgeon 12/31/2015, 10:43 AM

## 2015-12-17 NOTE — Consult Note (Deleted)
[facility] NAME: Lisa Lowery    MR#:  [id number]  DATE OF BIRTH:  04-29-1941  DATE OF ADMISSION:  12/26/2015  PRIMARY CARE PHYSICIAN: No PCP Per Patient   REQUESTING/REFERRING PHYSICIAN: DR Edd Fabian  CHIEF COMPLAINT:   Chief Complaint  Patient presents with  . Flank Pain  . Shortness of Breath    HISTORY OF PRESENT ILLNESS:  Lisa Lowery  is a 75 y.o. female with a known history of Atrial fibrillation, diabetes mellitus, end-stage renal disease who presents to the hospital with complaints of severe abdominal pain in the right upper quadrant of abdomen, right lower chest. Patient tells me that the pain woke her up from the sleep last night , she could not get comfortable until she laid down on the right side, she was nauseated and gagging, and because of significant pain, she decided to come to emergency room for further evaluation. Unfortunately, not able to get much of a history since patient is prostrated, uncomfortable, moaning. Patient admits however that she's been having nausea and vomiting episodes since approximately 1 week ago, however, developed pain just today. In emergency room, patient underwent CT scan of chest with IV contrast due to concerns of pulmonary embolism, however free air pockets were noted, in abdomen, pleural effusions, but no pulmonary embolism, hospitalist services were contacted for consultation.  PAST MEDICAL HISTORY:   Past Medical History  Diagnosis Date  . Vertigo   . Hypertension   . Diabetes ([place])   . A-fib ([place])   . Multiple myeloma ([place])   . Renal insufficiency     PAST SURGICAL HISTOIRY:   Past Surgical History  Procedure Laterality Date  . Small intestine surgery    . Abdominal hysterectomy    . Peripheral vascular catheterization N/A 11/19/2015    Procedure: Dialysis/Perma Catheter Insertion;  Surgeon: Katha Cabal, MD;  Location: [facility];  Service:  Cardiovascular;  Laterality: N/A;    SOCIAL HISTORY:   Social History  Substance Use Topics  . Smoking status: Never Smoker   . Smokeless tobacco: Never Used  . Alcohol Use: No    FAMILY HISTORY:   Family History  Problem Relation Age of Onset  . Cancer Mother   . Cancer Father   . Diabetes Sister     DRUG ALLERGIES:  No Known Allergies  REVIEW OF SYSTEMS:  Patient admits of nausea and vomiting for the past one week, significant right upper quadrant abdominal pain since last night, not able to otherwise provide any history due to prostration, discomfort, acute illness  MEDICATIONS AT HOME:   Prior to Admission medications   Medication Sig Start Date End Date Taking? Authorizing Provider  albuterol (PROVENTIL) (2.5 MG/3ML) 0.083% nebulizer solution Take 2.5 mg by nebulization every 4 (four) hours as needed for wheezing or shortness of breath.   Yes Historical Provider, MD  barrier cream (NON-SPECIFIED) CREA Apply 1 application topically 3 (three) times daily.   Yes Historical Provider, MD  docusate sodium (COLACE) 100 MG capsule Take 100 mg by mouth at bedtime.   Yes Historical Provider, MD  furosemide (LASIX) 20 MG tablet Take 20 mg by mouth 2 (two) times daily.   Yes Historical Provider, MD  Nutritional Supplements (FEEDING SUPPLEMENT, NEPRO CARB STEADY,) LIQD Take 237 mLs by mouth 2 (two) times daily between meals. 11/24/15  Yes Sital Mody, MD  ondansetron (ZOFRAN) 4 MG tablet Take 4 mg by mouth 2 (two) times  daily.   Yes Historical Provider, MD      VITAL SIGNS:  Blood pressure 85/61, pulse 111, temperature 97.6 F (36.4 C), temperature source Oral, resp. rate 19, SpO2 98 %.  PHYSICAL EXAMINATION:  GENERAL:  75 y.o.-year-old patient lying in the bedIn moderate to severe distress due to abdominal pain, moaning, prostrated  EYES: Pupils equal, round, reactive to light and accommodation. No scleral icterus. Extraocular muscles intact.  HEENT: Head atraumatic,  normocephalic. Oropharynx and nasopharynx clear.  NECK:  Supple, no jugular venous distention. No thyroid enlargement, no tenderness.  LUNGS: Normal breath sounds bilaterally, diminished at bases, no wheezing, rales,rhonchi or crepitation. Intermittent use of accessory muscles of respiration, especially with exertion or talking.  CARDIOVASCULAR: S1, S2 , irregularly irregular, tachycardic. No murmurs, rubs, or gallops.  ABDOMEN: Moderately firm, tender diffusely, some rebound,  guarding was noted diffusely in all abdomen, mostly in the right upper quadrant, epigastric area, nondistended. Bowel sounds present, diminished. No organomegaly or mass.  EXTREMITIES: Trace pedal edema, no cyanosis, or clubbing.  NEUROLOGIC: Cranial nerves II through XII are intact. Muscle strength 5/5 in all extremities. Sensation intact. Gait not checked.  PSYCHIATRIC: The patient is somnolent, but able to open her eyes, converses briefly, oriented 3 SKIN: No obvious rash, lesion, or ulcer.   LABORATORY PANEL:   CBC  Recent Labs Lab 12/16/2015 0620  WBC 8.7  HGB 11.3*  HCT 35.4  PLT 205   ------------------------------------------------------------------------------------------------------------------  Chemistries   Recent Labs Lab 01/11/2016 0620  NA 133*  K 4.2  CL 100*  CO2 23  GLUCOSE 89  BUN 31*  CREATININE 5.51*  CALCIUM 8.1*  AST 261*  ALT 148*  ALKPHOS 79  BILITOT 1.0   ------------------------------------------------------------------------------------------------------------------  Cardiac Enzymes  Recent Labs Lab 12/14/2015 0620  TROPONINI 0.53*   ------------------------------------------------------------------------------------------------------------------  RADIOLOGY:  Dg Chest Portable 1 View  12/31/2015  CLINICAL DATA:  75 year old female with atrial fibrillation, diabetes, and end-stage renal disease on dialysis with chest pain and shortness of breath EXAM: PORTABLE CHEST 1  VIEW COMPARISON:  Prior chest x-ray 11/14/2015 FINDINGS: Right IJ approach tunneled hemodialysis catheter. The catheter tip projects over the distal SVC. Stable cardiomegaly and mediastinal contours. There are small to moderate bilateral layering pleural effusions. Mild vascular congestion without overt edema. Bibasilar atelectasis. No evidence of pneumothorax. Osseous structures are intact and unremarkable. Atherosclerotic calcifications again noted in the transverse aorta. IMPRESSION: Right IJ approach tunneled hemodialysis catheter. The catheter tip overlies the distal SVC. Stable cardiomegaly and bilateral layering pleural effusions with associated bibasilar atelectasis. Electronically Signed   By: Jacqulynn Cadet M.D.   On: 01/12/2016 08:00   US Abdomen Limited Ruq  01/07/2016  CLINICAL DATA:  Right chest pain, shortness of breath. EXAM: US ABDOMEN LIMITED - RIGHT UPPER QUADRANT COMPARISON:  11/16/2015 FINDINGS: Gallbladder: Single large stone again noted within the gallbladder, measuring up to 2.1 cm. No wall thickening. Negative sonographic Murphy's. Common bile duct: Diameter: Normal caliber, 3 mm. Liver: No focal lesion identified. Within normal limits in parenchymal echogenicity. Incidentally noted is mild perihepatic ascites and right effusion IMPRESSION: Cholelithiasis.  No sonographic evidence of acute cholecystitis. Right effusion.  Perihepatic ascites. Electronically Signed   By: Rolm Baptise M.D.   On: 12/14/2015 07:19    EKG:   Orders placed or performed during the hospital encounter of 12/22/2015  . ED EKG  . ED EKG  . EKG 12-Lead  . EKG 12-Lead    IMPRESSION AND PLAN:    Active Problems:  RUQ pain   Atrial fibrillation with RVR (HCC)   Hypotension   Dyspnea   Hypoxia   Nausea   ESRD (end stage renal disease) ([place]) #1. Right upper quadrant abdominal pain due to perforated viscus, initiate patient on Zosyn, get surgical consultation, needs a CT of abdomen and pelvis? #2.  Sepsis, blood cultures, initiate broad-spectrum antibiotic therapy, Zosyn and vancomycin, adjust antibiotics depending on culture results #3. Hypoxia, continue oxygen therapy as needed to keep pulse oximetry at 94% and above #4 hypotension, continue IV fluids with boluses if needed #5. Nausea, supportive therapy, IV fluids #6. Dyspnea, supportive therapy, oxygen, morphine as needed #7. End-stage renal disease, nephrology consultation would be advised #8. Diabetes mellitus type 2. Continue sliding scale insulin, nothing by mouth #. A. fib, RVR, IV fluids, supportive therapy, will use amiodarone as needed for heart rate control, but I suspect heart rate is high due to metabolic acidosis, pain, stress, hypotension, sepsis   All the records are reviewed and case discussed with Consulting provider. Management plans discussed with the patient, family and they are in agreement.  CODE STATUS: Full code  TOTAL CRITICAL CARE TIME TAKING CARE OF THIS PATIENT: 55 minutes.    Theodoro Grist M.D on 01/03/2016 at 8:58 AM  Between 7am to 6pm - Pager - [phone]  After 6pm go to [email or web address] - password [email or web address] [facility]  Office  [phone]  CC: Primary care Physician: No PCP Per Patient

## 2015-12-17 NOTE — Procedures (Signed)
Central Venous Catheter Placement: Indication: Patient receiving vesicant or irritant drug.; Patient receiving intravenous therapy for longer than 5 days.; Patient has limited or no vascular access.   Consent:verbal/written  Risks and benefits explained in detail including risk of infection, bleeding, respiratory failure and death..   Hand washing performed prior to starting the procedure.   Procedure: An active timeout was performed and correct patient, name, & ID confirmed.  After explaining risk and benefits, patient was positioned correctly for central venous access. Patient was prepped using strict sterile technique including chlorohexadine preps, sterile drape, sterile gown and sterile gloves.  The area was prepped, draped and anesthetized in the usual sterile manner. Patient comfort was obtained.  A triple lumen catheter was placed in LEFT Internal Jugular Vein There was good blood return, catheter caps were placed on lumens, catheter flushed easily, the line was secured and a sterile dressing and BIO-PATCH applied.   Ultrasound was used to visualize vasculature and guidance of needle.   Number of Attempts: 1 Complications:none Estimated Blood Loss: none Chest Radiograph indicated and ordered.  Operator: Shequita Peplinski.   Corrin Parker, M.D.  [facility]  Medical Director [place] Director [facility] Cardio-Pulmonary Department

## 2015-12-17 NOTE — ED Notes (Signed)
Pt presents to ED via EMS with c/o right sided flank pain and SOB. Pt per EMS initial O2-93% on room and 96% on 2L [place], BP-110/74. Pt alerts and oriented x4 at this time. Pt appears uncomfortable and moaning.

## 2015-12-17 NOTE — Progress Notes (Signed)
eLink Physician-Brief Progress Note Patient Name: Lisa Lowery DOB: March 16, 1941 MRN: [id number]   Date of Service  01/01/2016  HPI/Events of Note  Lactic Acid level = 5.3 and BP = 84/55. Currently on a Norepinephrine IV infusion.   eICU Interventions  Will order: 1. 0.9 NaCl 1 liter IV now. 2. Monitor CVP. 3. Lactic Acid at 10 PM and Q 3 hours X 3. 4. Titrate Norepinephrine IV infusion up.     Intervention Category Major Interventions: Acid-Base disturbance - evaluation and management  Lisa Lowery 01/03/2016, 7:31 PM

## 2015-12-17 NOTE — ED Notes (Signed)
Patient transported to CT 

## 2015-12-18 DIAGNOSIS — E43 Unspecified severe protein-calorie malnutrition: Secondary | ICD-10-CM

## 2015-12-18 DIAGNOSIS — I4891 Unspecified atrial fibrillation: Secondary | ICD-10-CM

## 2015-12-18 DIAGNOSIS — R110 Nausea: Secondary | ICD-10-CM

## 2015-12-18 DIAGNOSIS — R1011 Right upper quadrant pain: Secondary | ICD-10-CM

## 2015-12-18 LAB — RENAL FUNCTION PANEL
ALBUMIN: 1.3 g/dL — AB (ref 3.5–5.0)
ALBUMIN: 1.3 g/dL — AB (ref 3.5–5.0)
ANION GAP: 5 (ref 5–15)
Albumin: 1.4 g/dL — ABNORMAL LOW (ref 3.5–5.0)
Albumin: 1.4 g/dL — ABNORMAL LOW (ref 3.5–5.0)
Anion gap: 11 (ref 5–15)
Anion gap: 8 (ref 5–15)
Anion gap: 8 (ref 5–15)
BUN: 38 mg/dL — ABNORMAL HIGH (ref 6–20)
BUN: 33 mg/dL — ABNORMAL HIGH (ref 6–20)
BUN: 31 mg/dL — ABNORMAL HIGH (ref 6–20)
BUN: 27 mg/dL — AB (ref 6–20)
CALCIUM: 7.6 mg/dL — AB (ref 8.9–10.3)
CALCIUM: 7.5 mg/dL — AB (ref 8.9–10.3)
CHLORIDE: 99 mmol/L — AB (ref 101–111)
CO2: 21 mmol/L — AB (ref 22–32)
CO2: 26 mmol/L (ref 22–32)
CO2: 23 mmol/L (ref 22–32)
CO2: 24 mmol/L (ref 22–32)
CREATININE: 5.97 mg/dL — AB (ref 0.44–1.00)
CREATININE: 4.68 mg/dL — AB (ref 0.44–1.00)
CREATININE: 4.1 mg/dL — AB (ref 0.44–1.00)
Calcium: 7.5 mg/dL — ABNORMAL LOW (ref 8.9–10.3)
Calcium: 7.6 mg/dL — ABNORMAL LOW (ref 8.9–10.3)
Chloride: 101 mmol/L (ref 101–111)
Chloride: 101 mmol/L (ref 101–111)
Chloride: 101 mmol/L (ref 101–111)
Creatinine, Ser: 5.13 mg/dL — ABNORMAL HIGH (ref 0.44–1.00)
GFR calc Af Amer: 9 mL/min — ABNORMAL LOW (ref >60)
GFR calc Af Amer: 11 mL/min — ABNORMAL LOW (ref >60)
GFR calc non Af Amer: 6 mL/min — ABNORMAL LOW (ref >60)
GFR calc non Af Amer: 10 mL/min — ABNORMAL LOW (ref >60)
GFR, EST AFRICAN AMERICAN: 7 mL/min — AB (ref >60)
GFR, EST AFRICAN AMERICAN: 10 mL/min — AB (ref >60)
GFR, EST NON AFRICAN AMERICAN: 7 mL/min — AB (ref >60)
GFR, EST NON AFRICAN AMERICAN: 8 mL/min — AB (ref >60)
GLUCOSE: 118 mg/dL — AB (ref 65–99)
GLUCOSE: 122 mg/dL — AB (ref 65–99)
Glucose, Bld: 116 mg/dL — ABNORMAL HIGH (ref 65–99)
Glucose, Bld: 97 mg/dL (ref 65–99)
PHOSPHORUS: 5.5 mg/dL — AB (ref 2.5–4.6)
PHOSPHORUS: 4.4 mg/dL (ref 2.5–4.6)
POTASSIUM: 4.3 mmol/L (ref 3.5–5.1)
Phosphorus: 6.5 mg/dL — ABNORMAL HIGH (ref 2.5–4.6)
Phosphorus: 4.9 mg/dL — ABNORMAL HIGH (ref 2.5–4.6)
Potassium: 4.5 mmol/L (ref 3.5–5.1)
Potassium: 4.3 mmol/L (ref 3.5–5.1)
Potassium: 4.3 mmol/L (ref 3.5–5.1)
SODIUM: 132 mmol/L — AB (ref 135–145)
SODIUM: 133 mmol/L — AB (ref 135–145)
Sodium: 131 mmol/L — ABNORMAL LOW (ref 135–145)
Sodium: 132 mmol/L — ABNORMAL LOW (ref 135–145)

## 2015-12-18 LAB — COMPREHENSIVE METABOLIC PANEL
ALK PHOS: 64 U/L (ref 38–126)
ALT: 153 U/L — AB (ref 14–54)
AST: 221 U/L — ABNORMAL HIGH (ref 15–41)
Albumin: 1.5 g/dL — ABNORMAL LOW (ref 3.5–5.0)
Anion gap: 12 (ref 5–15)
BUN: 36 mg/dL — ABNORMAL HIGH (ref 6–20)
CALCIUM: 7.8 mg/dL — AB (ref 8.9–10.3)
CHLORIDE: 101 mmol/L (ref 101–111)
CO2: 18 mmol/L — ABNORMAL LOW (ref 22–32)
CREATININE: 5.86 mg/dL — AB (ref 0.44–1.00)
GFR, EST AFRICAN AMERICAN: 7 mL/min — AB (ref >60)
GFR, EST NON AFRICAN AMERICAN: 6 mL/min — AB (ref >60)
Glucose, Bld: 91 mg/dL (ref 65–99)
Potassium: 4.6 mmol/L (ref 3.5–5.1)
Sodium: 131 mmol/L — ABNORMAL LOW (ref 135–145)
Total Bilirubin: 1.2 mg/dL (ref 0.3–1.2)
Total Protein: 7.5 g/dL (ref 6.5–8.1)

## 2015-12-18 LAB — BASIC METABOLIC PANEL
Anion gap: 10 (ref 5–15)
BUN: 29 mg/dL — AB (ref 6–20)
CHLORIDE: 101 mmol/L (ref 101–111)
CO2: 22 mmol/L (ref 22–32)
Calcium: 7.6 mg/dL — ABNORMAL LOW (ref 8.9–10.3)
Creatinine, Ser: 4.35 mg/dL — ABNORMAL HIGH (ref 0.44–1.00)
GFR calc Af Amer: 10 mL/min — ABNORMAL LOW (ref >60)
GFR calc non Af Amer: 9 mL/min — ABNORMAL LOW (ref >60)
GLUCOSE: 106 mg/dL — AB (ref 65–99)
POTASSIUM: 4.4 mmol/L (ref 3.5–5.1)
SODIUM: 133 mmol/L — AB (ref 135–145)

## 2015-12-18 LAB — CBC
HCT: 40.3 % (ref 35.0–47.0)
Hemoglobin: 12.9 g/dL (ref 12.0–16.0)
MCH: 30.5 pg (ref 26.0–34.0)
MCHC: 31.9 g/dL — ABNORMAL LOW (ref 32.0–36.0)
MCV: 95.5 fL (ref 80.0–100.0)
PLATELETS: 224 10*3/uL (ref 150–440)
RBC: 4.22 MIL/uL (ref 3.80–5.20)
RDW: 20.1 % — AB (ref 11.5–14.5)
WBC: 17.9 10*3/uL — AB (ref 3.6–11.0)

## 2015-12-18 LAB — LACTIC ACID, PLASMA: LACTIC ACID, VENOUS: 4.5 mmol/L — AB (ref 0.5–2.0)

## 2015-12-18 LAB — GLUCOSE, CAPILLARY
Glucose-Capillary: 107 mg/dL — ABNORMAL HIGH (ref 65–99)
Glucose-Capillary: 95 mg/dL (ref 65–99)
Glucose-Capillary: 105 mg/dL — ABNORMAL HIGH (ref 65–99)
Glucose-Capillary: 91 mg/dL (ref 65–99)
Glucose-Capillary: 82 mg/dL (ref 65–99)

## 2015-12-18 LAB — MAGNESIUM: MAGNESIUM: 1.8 mg/dL (ref 1.7–2.4)

## 2015-12-18 MED ORDER — PIPERACILLIN-TAZOBACTAM 3.375 G IVPB
3.3750 g | Freq: Three times a day (TID) | INTRAVENOUS | Status: DC
  Administered 2015-12-18 – 2015-12-20 (×6): 3.375 g via INTRAVENOUS
  Filled 2015-12-18 (×8): qty 50

## 2015-12-18 MED ORDER — PUREFLOW DIALYSIS SOLUTION
INTRAVENOUS | Status: DC
  Administered 2015-12-18 – 2015-12-19 (×7): via INTRAVENOUS_CENTRAL

## 2015-12-18 MED ORDER — AMIODARONE LOAD VIA INFUSION
150.0000 mg | Freq: Once | INTRAVENOUS | Status: DC
  Filled 2015-12-18: qty 83.34

## 2015-12-18 MED ORDER — AMIODARONE HCL IN DEXTROSE 360-4.14 MG/200ML-% IV SOLN
30.0000 mg/h | INTRAVENOUS | Status: DC
  Filled 2015-12-18 (×3): qty 200

## 2015-12-18 MED ORDER — AMIODARONE HCL IN DEXTROSE 360-4.14 MG/200ML-% IV SOLN
60.0000 mg/h | INTRAVENOUS | Status: DC
  Filled 2015-12-18: qty 200

## 2015-12-18 MED ORDER — HEPARIN SODIUM (PORCINE) 1000 UNIT/ML DIALYSIS
1000.0000 [IU] | INTRAMUSCULAR | Status: DC | PRN
  Administered 2015-12-19: 1500 [IU] via INTRAVENOUS_CENTRAL

## 2015-12-18 NOTE — Plan of Care (Signed)
Problem: Fluid Volume: Goal: Compliance with measures to maintain balanced fluid volume will improve Outcome: Progressing Patient started on CRRT during shift with no ultrafiltration ordered per MD. Blood pressure supported with levophed, which was able to be titrated down during shift. Patient's CVPs gradually increasing during shift and patient with swollen extremities. Patient reports no discomfort or shortness of breath.

## 2015-12-18 NOTE — Progress Notes (Signed)
LCSW came to check on patient to see if she was able to assessment. Was in with nurse so LCSW will come back later morning. [employer] LCSW [phone]

## 2015-12-18 NOTE — Plan of Care (Signed)
Problem: Physical Regulation: Goal: Ability to maintain clinical measurements within normal limits will improve Outcome: Progressing Levophed able to be titrated down. Currently on 4 lpm [employer].

## 2015-12-18 NOTE — Progress Notes (Signed)
Received call from lab.   Lactic acid now at 4.5.  [place] NP notified.

## 2015-12-18 NOTE — Progress Notes (Signed)
CC: septic shock, pneumoperitoneum, likely perf DU Subjective: Arousable and responsive to questions this AM, denies abd pain, says she feels she is getting better.  Per nursing, stable on pressors overnight, no bowel function, plan for CRRT today  Objective: Vital signs in last 24 hours: Temp:  [97.6 F (36.4 C)-98 F (36.7 C)] 97.9 F (36.6 C) (05/06 0800) Pulse Rate:  [94-129] 115 (05/06 0800) Resp:  [11-32] 13 (05/06 0800) BP: (74-125)/(49-101) 92/49 mmHg (05/06 0800) SpO2:  [84 %-98 %] 92 % (05/06 0800) Weight:  [203 lb 7.8 oz (92.3 kg)] 203 lb 7.8 oz (92.3 kg) (05/05 1042) Last BM Date: 12/23/2015  Intake/Output from previous day: 05/05 0701 - 05/06 0700 In: 453.2 [I.V.:243.2; NG/GT:135; IV Piggyback:75] Out: 500 [Emesis/NG output:500] Intake/Output this shift:    Physical exam:  Awake, alert Unlabored, RRR Soft, nondistended, abdominal wall anasarca, min TTP, no rebound, no guarding 2+ edema  Lab Results: CBC   Recent Labs  12/19/2015 0620 12/18/15 0228  WBC 8.7 17.9*  HGB 11.3* 12.9  HCT 35.4 40.3  PLT 205 224   BMET  Recent Labs  12/18/15 0132 12/18/15 0817  NA 131* 131*  K 4.6 4.5  CL 101 99*  CO2 18* 21*  GLUCOSE 91 118*  BUN 36* 38*  CREATININE 5.86* 5.97*  CALCIUM 7.8* 7.5*   PT/INR  Recent Labs  12/30/2015 0620  LABPROT 19.3*  INR 1.62   ABG No results for input(s): PHART, HCO3 in the last 72 hours.  Invalid input(s): PCO2, PO2  Studies/Results: Dg Abd 1 View  12/14/2015  CLINICAL DATA:  NG tube placement EXAM: ABDOMEN - 1 VIEW COMPARISON:  11/16/2015 FINDINGS: There is NG tube in place with tip in proximal stomach. Gaseous distended small bowel loops are noted mid abdomen suspicious for ileus or partial small bowel obstruction. IMPRESSION: NG tube with tip in proximal stomach. Gaseous distended small bowel loops mid abdomen suspicious for ileus or partial bowel obstruction Electronically Signed   By: Lahoma Crocker M.D.   On: 12/19/2015  11:37   Ct Angio Chest Pe W/cm &/or Wo Cm  01/05/2016  CLINICAL DATA:  Possible pulmonary embolus, end-stage renal disease, shortness of Breath EXAM: CT ANGIOGRAPHY CHEST WITH CONTRAST TECHNIQUE: Multidetector CT imaging of the chest was performed using the standard protocol during bolus administration of intravenous contrast. Multiplanar CT image reconstructions and MIPs were obtained to evaluate the vascular anatomy. CONTRAST:  80 cc Isovue COMPARISON:  None. FINDINGS: Mediastinum/Lymph Nodes: Cardiomegaly is noted. Images of the thoracic inlet are unremarkable. Central airways are patent. Atherosclerotic calcifications of thoracic aorta. No pulmonary embolus is noted. No pericardial effusion. No hilar adenopathy is noted. A right IJ dialysis catheter is noted. Lungs/Pleura: There is bilateral moderate size pleural effusion right greater than left. Atelectasis noted bilateral lower lobe. Mild atelectasis noted in lingula. There is no convincing pulmonary edema. Upper abdomen: There is small perihepatic ascites. Free air is noted in right upper abdomen anteriorly axial images 110 and 127. Although findings may be due to recent instrumentation perforated viscus cannot be excluded. Clinical correlation is necessary. Musculoskeletal: No destructive bony lesions are noted. Degenerative changes thoracic spine. Sagittal view of the sternum is unremarkable. No destructive rib lesions are noted. Review of the MIP images confirms the above findings. IMPRESSION: 1. No pulmonary embolus is noted. 2. No mediastinal hematoma or adenopathy. 3. Cardiomegaly is noted. 4. Bilateral moderate size pleural effusion right greater than left. Bilateral lower lobe atelectasis. Mild atelectasis noted in lingula. No  convincing pulmonary edema. 5. Small perihepatic ascites. There is free air in right upper abdomen anteriorly. Although this may be due to recent instrumentation perforated viscus cannot be excluded. Clinical correlation is  necessary. These results were called by telephone at the time of interpretation on 01/06/2016 at 8:57 am to Dr. Loura Pardon , who verbally acknowledged these results. Electronically Signed   By: Lahoma Crocker M.D.   On: 12/23/2015 08:57   Ct Abdomen Pelvis W Contrast  12/22/2015  CLINICAL DATA:  Right upper quadrant abdominal pain. History of biloma. EXAM: CT ABDOMEN AND PELVIS WITH CONTRAST TECHNIQUE: Multidetector CT imaging of the abdomen and pelvis was performed using the standard protocol following bolus administration of intravenous contrast. CONTRAST:  44mL ISOVUE-300 IOPAMIDOL (ISOVUE-300) INJECTION 61% COMPARISON:  Chest CT - 12/26/2015; abdominal ultrasound - 01/03/2016 FINDINGS: Lower chest: Limited visualization of lower thorax demonstrates small to moderate size bilateral effusions with associated atelectasis / collapse of the imaged portions of the bilateral lower lobes. Cardiomegaly. Coronary artery calcifications. The tip of a dialysis catheter terminates within the superior aspect of the right atrium. No pericardial effusion. Hepatobiliary: There is mild nodularity of the hepatic contour. No discrete hepatic lesions. Note is made of an approximately 1.8 x 1.5 cm gallstone within otherwise normal-appearing gallbladder. Small amount of intra-abdominal ascites. Pancreas: Normal appearance of the pancreas Spleen: Normal appearance of the spleen.  No evidence splenomegaly. Adrenals/Urinary Tract: There is symmetric enhancement of the bilateral kidneys. Renal cysts are seen bilaterally. No definite renal stones this postcontrast examination. No urine obstruction or perinephric stranding. Normal appearance of the bilateral adrenal glands. Normal appearance of the urinary bladder given underdistention. Several phleboliths are seen with the lower pelvis bilaterally, left greater than right. Stomach/Bowel: Small volume pneumoperitoneum as was demonstrated on chest CT performed earlier same day. Enteric contrast  extends to the level of the mid small bowel. While the exact etiology of the small bowel pneumoperitoneum is not definitely demonstrated on the present examination, there is abnormal thickening involving the descending portion of the duodenum (axial image 39, series 2; coronal image 81, series 6). Small volume intra-abdominal ascites. No definable / drainable fluid collection. The colon is underdistended however there is no evidence of enteric obstruction. Normal appearance of the terminal ileum. The appendix is not visualized, however there is no definitive pericecal inflammatory change. No pneumatosis or portal venous gas. Vascular/Lymphatic: Moderate amount of mixed calcified and noncalcified atherosclerotic plaque within a normal caliber abdominal aorta. The major branch vessel of the abdominal aorta appear patent on this non CTA examination. Several large varicosities are noted about the gastric fundus (representative images 24, 26 and 30, series 2. 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 Reproductive: Post hysterectomy. No discrete adnexal lesion. Small to moderate amount of intra-abdominal ascites tracks into the pelvic cul-de-sac. Other: Diffuse body wall anasarca. Subcutaneous areas of ill-defined stranding about the left lower abdomen are likely this sites of subcutaneous medication administration. Musculoskeletal: No acute or aggressive osseous abnormalities. 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 Degenerative change of the bilateral hips. IMPRESSION: 1. Small volume pneumoperitoneum, the etiology of which is not depicted on this examination however there is apparent abnormal thickening involving the descending portion of the duodenum. Further evaluation with endoscopy could performed as clinically indicated. 2. No definable/drainable intra-abdominal  fluid collection. 3. Findings suggestive of cirrhosis and portal venous hypertension  with small volume intra-abdominal ascites, several moderate-sized gastric varices and evidence of a splenorenal shunt. No evidence splenomegaly. Further evaluation with endoscopy could be performed as clinically indicated. 4. Findings compatible with pulmonary edema with small to moderate sized bilateral effusions and diffuse body wall anasarca. 5. Atherosclerosis including coronary artery calcifications. 6. Cholelithiasis without evidence of cholecystitis. Critical Value/emergent results were called by telephone at the time of interpretation on 12/16/2015 at 2:21 pm to Dr. Caroleen Hamman , who verbally acknowledged these results. Electronically Signed   By: Sandi Mariscal M.D.   On: 01/05/2016 14:38   Dg Chest Port 1 View  01/09/2016  CLINICAL DATA:  Central line placement EXAM: PORTABLE CHEST 1 VIEW COMPARISON:  Earlier today FINDINGS: Moderate cardiomegaly. New left jugular central venous catheter place with its tip at the cavoatrial junction and no pneumothorax. NG tube placed with its tip in the stomach. Stable right jugular dialysis catheter. Bilateral pleural effusions and bilateral pulmonary opacity are stable. IMPRESSION: New left jugular central venous catheter place with its tip at the cavoatrial junction and no pneumothorax. NG tube has also been placed with its tip in the stomach. Otherwise stable. Electronically Signed   By: Marybelle Killings M.D.   On: 12/23/2015 16:52   Dg Chest Portable 1 View  01/08/2016  CLINICAL DATA:  75 year old female with atrial fibrillation, diabetes, and end-stage renal disease on dialysis with chest pain and shortness of breath EXAM: PORTABLE CHEST 1 VIEW COMPARISON:  Prior chest x-ray 11/14/2015 FINDINGS: Right IJ approach tunneled hemodialysis catheter. The catheter tip projects over the distal SVC. Stable cardiomegaly and mediastinal contours. There are small to moderate bilateral layering  pleural effusions. Mild vascular congestion without overt edema. Bibasilar atelectasis. No evidence of pneumothorax. Osseous structures are intact and unremarkable. Atherosclerotic calcifications again noted in the transverse aorta. IMPRESSION: Right IJ approach tunneled hemodialysis catheter. The catheter tip overlies the distal SVC. Stable cardiomegaly and bilateral layering pleural effusions with associated bibasilar atelectasis. Electronically Signed   By: Jacqulynn Cadet M.D.   On: 12/27/2015 08:00   US Abdomen Limited Ruq  01/09/2016  CLINICAL DATA:  Right chest pain, shortness of breath. EXAM: US ABDOMEN LIMITED - RIGHT UPPER QUADRANT COMPARISON:  11/16/2015 FINDINGS: Gallbladder: Single large stone again noted within the gallbladder, measuring up to 2.1 cm. No wall thickening. Negative sonographic Murphy's. Common bile duct: Diameter: Normal caliber, 3 mm. Liver: No focal lesion identified. Within normal limits in parenchymal echogenicity. Incidentally noted is mild perihepatic ascites and right effusion IMPRESSION: Cholelithiasis.  No sonographic evidence of acute cholecystitis. Right effusion.  Perihepatic ascites. Electronically Signed   By: Rolm Baptise M.D.   On: 01/12/2016 07:19    Anti-infectives: Anti-infectives    Start     Dose/Rate Route Frequency Ordered Stop   01-06-2016 1200  vancomycin (VANCOCIN) IVPB 750 mg/150 ml premix  Status:  Discontinued     750 mg 150 mL/hr over 60 Minutes Intravenous Every M-W-F (Hemodialysis) 01/11/2016 1241 12/21/2015 1500   12/18/15 1400  piperacillin-tazobactam (ZOSYN) IVPB 3.375 g     3.375 g 12.5 mL/hr over 240 Minutes Intravenous Every 8 hours 12/18/15 0727     12/24/2015 1730  piperacillin-tazobactam (ZOSYN) IVPB 3.375 g  Status:  Discontinued     3.375 g 12.5 mL/hr over 240 Minutes Intravenous Every 12 hours 01/11/2016 1239 12/18/15 0727   01/08/2016 1245  vancomycin (VANCOCIN) IVPB 750 mg/150 ml premix     750 mg 150 mL/hr over 60 Minutes  Intravenous  Once 01/12/2016  1239 01/02/2016 1451   12/27/2015 0900  cefTRIAXone (ROCEPHIN) 1 g in dextrose 5 % 50 mL IVPB  Status:  Discontinued     1 g 100 mL/hr over 30 Minutes Intravenous Every 24 hours 01/02/2016 0857 12/24/2015 0900   01/02/2016 0900  vancomycin (VANCOCIN) IVPB 1000 mg/200 mL premix     1,000 mg 200 mL/hr over 60 Minutes Intravenous  Once 01/08/2016 0859 01/04/2016 1034   12/26/2015 0900  piperacillin-tazobactam (ZOSYN) IVPB 3.375 g     3.375 g 12.5 mL/hr over 240 Minutes Intravenous  Once 01/04/2016 0859 01/10/2016 1006      Assessment/Plan:  -cont IV abx -plan for CRRT today, anuric -HD remains on pressor but stable requirements with MAP consistently in mid 60s -no frank peritonitis, but do suspect that this is perf DU.  With her cirrhosis and significant comorbidity operative exploration would carry exceptionally high mortality, and would recommend continue nonoperative management at this time. Difficult situation  12/18/2015

## 2015-12-18 NOTE — Progress Notes (Signed)
eLink Physician-Brief Progress Note Patient Name: Lisa Lowery DOB: Apr 27, 1941 MRN: [id number]   Date of Service  12/18/2015  HPI/Events of Note  Increased HR - HR = 120 - 140. Irregularly Irrgegular.  AFIB?  eICU Interventions  Will order: 1. 12 Lead EKG now.  2. BMP and Mg++ level now.     Intervention Category Intermediate Interventions: Arrhythmia - evaluation and management  [name] Eugene 12/18/2015, 5:16 PM

## 2015-12-18 NOTE — Plan of Care (Signed)
Problem: Health Behavior/Discharge Planning: Goal: Ability to manage health-related needs will improve Outcome: Not Progressing Patient DNR. Currently not a surgical candidate. On CRRT. Able to titrate levophed down. On 2 lpm nasal cannula. Case work working directly with patient and family. Family visiting currently.

## 2015-12-18 NOTE — Progress Notes (Signed)
Critical lab lactic acid of 4.5.  [place] NP notified.

## 2015-12-18 NOTE — Progress Notes (Addendum)
eLink Physician-Brief Progress Note Patient Name: Lisa Lowery DOB: 1941/07/22 MRN: [id number]   Date of Service  12/18/2015  HPI/Events of Note  STAT EKG finally done! >> Accelerated junctional rhythm with retrograde conduction with premature supraventricular complexes.   eICU Interventions  HR now = 119. Hold Amiodarone IV bolus and infusion.      Intervention Category Major Interventions: Arrhythmia - evaluation and management  Sommer,Steven Cornelia Copa 12/18/2015, 6:33 PM
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
## 2015-12-18 NOTE — Progress Notes (Addendum)
Critical lab value for lactic acid at 5.3.  Dr Oletta Darter notified.  Will continue to monitor.

## 2015-12-18 NOTE — Progress Notes (Signed)
Patient is transferred to intensivist service. Discussed with Dr. Mortimer Fries, agreeable

## 2015-12-18 NOTE — Progress Notes (Signed)
MEDICATION RELATED CONSULT NOTE - INITIAL   Pharmacy Consult for Dose Adjustment for CRRT   No Known Allergies  Patient Measurements: Height: 5' 7" (170.2 cm) Weight: 203 lb 7.8 oz (92.3 kg) IBW/kg (Calculated) : 61.6  Vital Signs: Temp: 98 F (36.7 C) (05/05 2100) Temp Source: Oral (05/05 2100) BP: 87/54 mmHg (05/06 0430) Pulse Rate: 121 (05/06 0430) Intake/Output from previous day: 05/05 0701 - 05/06 0700 In: 453.2 [I.V.:243.2; NG/GT:135; IV Piggyback:75] Out: 500 [Emesis/NG output:500]  Labs:  Recent Labs  12/13/2015 0620 12/18/15 0132 12/18/15 0228  WBC 8.7  --  17.9*  HGB 11.3*  --  12.9  HCT 35.4  --  40.3  PLT 205  --  224  APTT 33  --   --   CREATININE 5.51* 5.86*  --   ALBUMIN 1.7* 1.5*  --   PROT 8.3* 7.5  --   AST 261* 221*  --   ALT 148* 153*  --   ALKPHOS 79 64  --   BILITOT 1.0 1.2  --    Estimated Creatinine Clearance: 9.5 mL/min (by C-G formula based on Cr of 5.86).  Medical History: Past Medical History  Diagnosis Date  . Vertigo   . Hypertension   . Diabetes (HCC)   . A-fib (HCC)   . Multiple myeloma (HCC)   . Renal insufficiency     Medications:  Scheduled:  . antiseptic oral rinse  7 mL Mouth Rinse BID  . barrier cream  1 application Topical TID  . docusate sodium  100 mg Oral QHS  . insulin aspart  0-5 Units Subcutaneous QHS  . insulin aspart  0-9 Units Subcutaneous TID WC  . [START ON 12/21/2015] pantoprazole (PROTONIX) IV  40 mg Intravenous Q12H  . piperacillin-tazobactam (ZOSYN)  IV  3.375 g Intravenous Q8H   Infusions:  . norepinephrine 12 mcg/min (12/18/15 0620)  . pantoprozole (PROTONIX) infusion 8 mg/hr (12/18/15 0338)  . pureflow    . sterile water 1,000 mL with sodium acetate 150 mEq infusion 50 mL/hr at 12/16/2015 1710   PRN: acetaminophen **OR** acetaminophen, albuterol, bisacodyl, heparin, morphine injection, ondansetron **OR** ondansetron (ZOFRAN) IV, oxyCODONE, sodium phosphate  Assessment: 75 yo female being  started of CRRT. Pharmacy consulted for medication review and adjustment.   Plan:   Zosyn 3.375 was adjusted to every 8 hours for CRRT.   Continuous renal replacement therapy (CRRT) (Heintz 2009; Trotman 2005): Drug clearance is highly dependent on the method of renal replacement, filter type, and flow rate. Appropriate dosing requires close monitoring of pharmacologic response, signs of adverse reactions due to drug accumulation, as well as drug concentrations in relation to target trough (if appropriate). General recommendations only (based on dialysate flow/ultrafiltration rates of 1 to 2 L/hour and minimal residual renal function)(Trotman 2005):  CVVHD: 2.25 to 3.375 g every 6 to 8 hours  Pharmacy will continue to review and adjust medications as needed.   Lisa Lowery 12/18/2015,7:28 AM    

## 2015-12-18 NOTE — Progress Notes (Signed)
CRRT intiated at this time per MD order. Patient resting comfortably. Blood pressure supported by levophed infusion.

## 2015-12-18 NOTE — NC FL2 (Signed)
[place] LEVEL OF CARE SCREENING TOOL     IDENTIFICATION  Patient Name: Lisa Lowery Birthdate: [birth year] Sex: female Admission Date (Current Location): 12/22/2015  [place] and [place] Number:  Engineering geologist and Address:  [facility], [address], [place], [place] [address]      Provider Number: [id number]  Attending Physician Name and Address:  Flora Lipps, MD  Relative Name and Phone Number:       Current Level of Care: Hospital Recommended Level of Care: [place] Prior Approval Number:    Date Approved/Denied:   PASRR Number:   [id number]   Discharge Plan: SNF    Current Diagnoses: Patient Active Problem List   Diagnosis Date Noted  . RUQ pain 01/06/2016  . Hypotension 12/29/2015  . Nausea 12/21/2015  . ESRD (end stage renal disease) ([place]) 12/13/2015  . Dyspnea 12/31/2015  . Hypoxia 12/18/2015  . Protein-calorie malnutrition, severe 01/02/2016  . Free intraperitoneal air   . Malnutrition of moderate degree 11/20/2015  . Pleural effusion, left 11/10/2015  . General weakness 11/10/2015  . DM2 (diabetes mellitus, type 2) ([place]) 11/10/2015  . Atrial fibrillation with RVR ([place]) 09/11/2015  . Essential hypertension, malignant 02/23/2015  . Renal insufficiency 02/23/2015  . DM (diabetes mellitus) type 2, uncontrolled, with ketoacidosis ([place]) 02/23/2015  . Dizziness 02/21/2015  . Elevated troponin 02/21/2015  . HTN (hypertension) 02/21/2015  . Elevated blood sugar 02/21/2015    Orientation RESPIRATION BLADDER Height & Weight     Self, Time, Situation, Place  O2 (2-4 litres weaning down) Continent Weight: 211 lb 10.3 oz (96 kg) Height:  5\' 7"  (170.2 cm)  BEHAVIORAL SYMPTOMS/MOOD NEUROLOGICAL BOWEL NUTRITION STATUS      Incontinent Diet (Dialysisi)  AMBULATORY STATUS COMMUNICATION OF NEEDS Skin   Extensive Assist Verbally Normal                       Personal Care Assistance Level of  Assistance  Bathing, Feeding, Dressing, Total care Bathing Assistance: Maximum assistance Feeding assistance: Limited assistance Dressing Assistance: Maximum assistance Total Care Assistance: Maximum assistance   Functional Limitations Info  Sight, Hearing, Speech Sight Info: Adequate Hearing Info: Adequate Speech Info: Adequate    SPECIAL CARE FACTORS FREQUENCY                       Contractures      Additional Factors Info  Code Status Code Status Info: DNR             Current Medications (12/18/2015):  This is the current hospital active medication list Current Facility-Administered Medications  Medication Dose Route Frequency Provider Last Rate Last Dose  . acetaminophen (TYLENOL) tablet 650 mg  650 mg Oral Q6H PRN Theodoro Grist, MD       Or  . acetaminophen (TYLENOL) suppository 650 mg  650 mg Rectal Q6H PRN Theodoro Grist, MD      . albuterol (PROVENTIL) (2.5 MG/3ML) 0.083% nebulizer solution 2.5 mg  2.5 mg Nebulization Q4H PRN Theodoro Grist, MD      . antiseptic oral rinse (CPC / CETYLPYRIDINIUM CHLORIDE 0.05%) solution 7 mL  7 mL Mouth Rinse BID Jules Husbands, MD   7 mL at 12/18/15 0929  . barrier cream (non-specified) 1 application  1 application Topical TID Theodoro Grist, MD   1 application at [id number]  . bisacodyl (DULCOLAX) suppository 10 mg  10 mg Rectal Daily PRN Theodoro Grist, MD      .  docusate sodium (COLACE) capsule 100 mg  100 mg Oral QHS Theodoro Grist, MD   100 mg at 12/15/2015 2154  . heparin injection 1,000-6,000 Units  1,000-6,000 Units CRRT PRN Munsoor Lateef, MD      . insulin aspart (novoLOG) injection 0-5 Units  0-5 Units Subcutaneous QHS Theodoro Grist, MD   0 Units at 12/19/2015 2116  . insulin aspart (novoLOG) injection 0-9 Units  0-9 Units Subcutaneous TID WC Theodoro Grist, MD   0 Units at 12/18/2015 1114  . morphine 2 MG/ML injection 2 mg  2 mg Intravenous Q4H PRN Theodoro Grist, MD      . norepinephrine (LEVOPHED) 4mg  in D5W 260mL premix  infusion  0-40 mcg/min Intravenous Titrated Theodoro Grist, MD 18.8 mL/hr at 12/18/15 1735 5 mcg/min at 12/18/15 1735  . ondansetron (ZOFRAN) tablet 4 mg  4 mg Oral Q6H PRN Theodoro Grist, MD       Or  . ondansetron (ZOFRAN) injection 4 mg  4 mg Intravenous Q6H PRN Theodoro Grist, MD   4 mg at 12/18/15 1508  . oxyCODONE (Oxy IR/ROXICODONE) immediate release tablet 5 mg  5 mg Oral Q4H PRN Theodoro Grist, MD      . pantoprazole (PROTONIX) 80 mg in sodium chloride 0.9 % 250 mL (0.32 mg/mL) infusion  8 mg/hr Intravenous Continuous Diego F Pabon, MD 25 mL/hr at 12/18/15 1350 8 mg/hr at 12/18/15 1350  . [START ON 12/21/2015] pantoprazole (PROTONIX) injection 40 mg  40 mg Intravenous Q12H Diego F Pabon, MD      . piperacillin-tazobactam (ZOSYN) IVPB 3.375 g  3.375 g Intravenous Q8H Sheema M Hallaji, RPH   3.375 g at 12/18/15 1353  . pureflow IV solution for Dialysis   CRRT Continuous Munsoor Lateef, MD 2,500 mL/hr at 12/18/15 1520    . sodium phosphate (FLEET) 7-19 GM/118ML enema 1 enema  1 enema Rectal Once PRN Theodoro Grist, MD      . sterile water 1,000 mL with sodium acetate 150 mEq infusion   Intravenous Continuous Theodoro Grist, MD 50 mL/hr at 12/18/15 1559       Discharge Medications: Please see discharge summary for a list of discharge medications.  Relevant Imaging Results:  Relevant Lab Results:   Additional Information SSN # [id number]  Joana Reamer, [place]

## 2015-12-18 NOTE — Consult Note (Signed)
[facility] Consultation      Name: Lisa Lowery MRN: [id number] DOB: 05-03-1941    ADMISSION DATE:  01/02/2016 CONSULTATION DATE:  12/16/2015 REFERRING MD :  Dr Ether Griffins  CHIEF COMPLAINT:  Acute resp distress and abd pain   HISTORY OF PRESENT ILLNESS  Remains critically ill, lethargic but arousable, started on vasopressors,  ROS limited due to letharguyc LIMITED ROS Review of Systems  Unable to perform ROS: medical condition  Constitutional: Positive for malaise/fatigue and diaphoresis.  HENT: Negative for congestion.   Eyes: Negative for blurred vision.  Respiratory: Positive for shortness of breath.   Cardiovascular: Positive for leg swelling.  Gastrointestinal: Positive for nausea, vomiting and abdominal pain.  Genitourinary: Negative for dysuria.  Skin: Negative for rash.  Neurological: Positive for dizziness and weakness. Negative for headaches.  Psychiatric/Behavioral: The patient is nervous/anxious.   All other systems reviewed and are negative.     VITAL SIGNS    Temp:  [97.6 F (36.4 C)-98 F (36.7 C)] 98 F (36.7 C) (05/05 2100) Pulse Rate:  [94-129] 111 (05/06 0700) Resp:  [11-32] 12 (05/06 0700) BP: (74-125)/(50-101) 95/55 mmHg (05/06 0700) SpO2:  [84 %-98 %] 97 % (05/06 0700) Weight:  [203 lb 7.8 oz (92.3 kg)] 203 lb 7.8 oz (92.3 kg) (05/05 1042) HEMODYNAMICS: CVP:  [12 mmHg-25 mmHg] 20 mmHg VENTILATOR SETTINGS:   INTAKE / OUTPUT:  Intake/Output Summary (Last 24 hours) at 12/18/15 0747 Last data filed at 01/03/2016 2200  Gross per 24 hour  Intake 453.15 ml  Output    500 ml  Net -46.85 ml       PHYSICAL EXAM   Physical Exam  Constitutional: She is oriented to person, place, and time. She appears well-developed and well-nourished. She appears distressed.  HENT:  Head: Normocephalic and atraumatic.  Mouth/Throat: No oropharyngeal exudate.  Eyes: EOM are normal. Pupils are equal, round, and reactive to light. No scleral  icterus.  Neck: Normal range of motion. Neck supple.  Cardiovascular: Normal rate, regular rhythm and normal heart sounds.   No murmur heard. Pulmonary/Chest: No stridor. She is in respiratory distress. She has no wheezes. She has rales.  Abdominal: She exhibits distension. There is tenderness.  distended  Musculoskeletal: Normal range of motion. She exhibits no edema.  Neurological: She is alert and oriented to person, place, and time. No cranial nerve deficit.  Skin: Skin is warm. She is diaphoretic.  Psychiatric: She has a normal mood and affect.       LABS   LABS:  CBC  Recent Labs Lab 01/11/2016 0620 12/18/15 0228  WBC 8.7 17.9*  HGB 11.3* 12.9  HCT 35.4 40.3  PLT 205 224   Coag's  Recent Labs Lab 12/28/2015 0620  APTT 33  INR 1.62   BMET  Recent Labs Lab 12/25/2015 0620 12/18/15 0132  NA 133* 131*  K 4.2 4.6  CL 100* 101  CO2 23 18*  BUN 31* 36*  CREATININE 5.51* 5.86*  GLUCOSE 89 91   Electrolytes  Recent Labs Lab 12/25/2015 0620 12/18/15 0132  CALCIUM 8.1* 7.8*   Sepsis Markers  Recent Labs Lab 01/06/2016 1833 01/06/2016 2242 12/18/15 0132  LATICACIDVEN 5.3* 4.5* 4.5*   ABG No results for input(s): PHART, PCO2ART, PO2ART in the last 168 hours. Liver Enzymes  Recent Labs Lab 01/08/2016 0620 12/18/15 0132  AST 261* 221*  ALT 148* 153*  ALKPHOS 79 64  BILITOT 1.0 1.2  ALBUMIN 1.7* 1.5*   Cardiac Enzymes  Recent Labs Lab  12/23/2015 0620 12/21/2015 1110 01/11/2016 2242  TROPONINI 0.53* 0.69* 0.70*   Glucose  Recent Labs Lab 12/25/2015 1039 01/07/2016 1730 01/07/2016 2019 01/03/2016 2333 12/18/15 0436 12/18/15 0741  GLUCAP 81 80 85 83 107* 95     Recent Results (from the past 240 hour(s))  MRSA PCR Screening     Status: None   Collection Time: 12/26/2015 11:04 AM  Result Value Ref Range Status   MRSA by PCR NEGATIVE NEGATIVE Final    Comment:        The GeneXpert MRSA Assay (FDA approved for NASAL specimens only), is one component  of a comprehensive MRSA colonization surveillance program. It is not intended to diagnose MRSA infection nor to guide or monitor treatment for MRSA infections.      Current facility-administered medications:  .  acetaminophen (TYLENOL) tablet 650 mg, 650 mg, Oral, Q6H PRN **OR** acetaminophen (TYLENOL) suppository 650 mg, 650 mg, Rectal, Q6H PRN, Theodoro Grist, MD .  albuterol (PROVENTIL) (2.5 MG/3ML) 0.083% nebulizer solution 2.5 mg, 2.5 mg, Nebulization, Q4H PRN, Theodoro Grist, MD .  antiseptic oral rinse (CPC / CETYLPYRIDINIUM CHLORIDE 0.05%) solution 7 mL, 7 mL, Mouth Rinse, BID, Diego F Pabon, MD, 7 mL at 12/24/2015 1155 .  barrier cream (non-specified) 1 application, 1 application, Topical, TID, Theodoro Grist, MD, 1 application at [phone] .  bisacodyl (DULCOLAX) suppository 10 mg, 10 mg, Rectal, Daily PRN, Theodoro Grist, MD .  docusate sodium (COLACE) capsule 100 mg, 100 mg, Oral, QHS, Theodoro Grist, MD, 100 mg at 12/22/2015 2154 .  heparin injection 1,000-6,000 Units, 1,000-6,000 Units, CRRT, PRN, Munsoor Lateef, MD .  insulin aspart (novoLOG) injection 0-5 Units, 0-5 Units, Subcutaneous, QHS, Theodoro Grist, MD, 0 Units at 01/10/2016 2116 .  insulin aspart (novoLOG) injection 0-9 Units, 0-9 Units, Subcutaneous, TID WC, Theodoro Grist, MD, 0 Units at 12/22/2015 1114 .  morphine 2 MG/ML injection 2 mg, 2 mg, Intravenous, Q4H PRN, Theodoro Grist, MD .  norepinephrine (LEVOPHED) 4mg  in D5W 24mL premix infusion, 0-40 mcg/min, Intravenous, Titrated, Theodoro Grist, MD, Last Rate: 45 mL/hr at 12/18/15 0620, 12 mcg/min at 12/18/15 0620 .  ondansetron (ZOFRAN) tablet 4 mg, 4 mg, Oral, Q6H PRN **OR** ondansetron (ZOFRAN) injection 4 mg, 4 mg, Intravenous, Q6H PRN, Theodoro Grist, MD, 4 mg at 12/23/2015 1320 .  oxyCODONE (Oxy IR/ROXICODONE) immediate release tablet 5 mg, 5 mg, Oral, Q4H PRN, Theodoro Grist, MD .  pantoprazole (PROTONIX) 80 mg in sodium chloride 0.9 % 250 mL (0.32 mg/mL) infusion, 8  mg/hr, Intravenous, Continuous, Diego F Pabon, MD, Last Rate: 25 mL/hr at 12/18/15 0338, 8 mg/hr at 12/18/15 0338 .  [START ON 12/21/2015] pantoprazole (PROTONIX) injection 40 mg, 40 mg, Intravenous, Q12H, Diego F Pabon, MD .  piperacillin-tazobactam (ZOSYN) IVPB 3.375 g, 3.375 g, Intravenous, Q8H, Sheema M Hallaji, RPH .  pureflow IV solution for Dialysis, , CRRT, Continuous, Munsoor Lateef, MD .  sodium phosphate (FLEET) 7-19 GM/118ML enema 1 enema, 1 enema, Rectal, Once PRN, Theodoro Grist, MD .  sterile water 1,000 mL with sodium acetate 150 mEq infusion, , Intravenous, Continuous, Theodoro Grist, MD, Last Rate: 50 mL/hr at 12/23/2015 1710  IMAGING    Dg Abd 1 View  12/14/2015  CLINICAL DATA:  NG tube placement EXAM: ABDOMEN - 1 VIEW COMPARISON:  11/16/2015 FINDINGS: There is NG tube in place with tip in proximal stomach. Gaseous distended small bowel loops are noted mid abdomen suspicious for ileus or partial small bowel obstruction. IMPRESSION: NG tube with tip in proximal  stomach. Gaseous distended small bowel loops mid abdomen suspicious for ileus or partial bowel obstruction Electronically Signed   By: Lahoma Crocker M.D.   On: 01/05/2016 11:37   Ct Angio Chest Pe W/cm &/or Wo Cm  12/25/2015  CLINICAL DATA:  Possible pulmonary embolus, end-stage renal disease, shortness of Breath EXAM: CT ANGIOGRAPHY CHEST WITH CONTRAST TECHNIQUE: Multidetector CT imaging of the chest was performed using the standard protocol during bolus administration of intravenous contrast. Multiplanar CT image reconstructions and MIPs were obtained to evaluate the vascular anatomy. CONTRAST:  80 cc Isovue COMPARISON:  None. FINDINGS: Mediastinum/Lymph Nodes: Cardiomegaly is noted. Images of the thoracic inlet are unremarkable. Central airways are patent. Atherosclerotic calcifications of thoracic aorta. No pulmonary embolus is noted. No pericardial effusion. No hilar adenopathy is noted. A right IJ dialysis catheter is noted.  Lungs/Pleura: There is bilateral moderate size pleural effusion right greater than left. Atelectasis noted bilateral lower lobe. Mild atelectasis noted in lingula. There is no convincing pulmonary edema. Upper abdomen: There is small perihepatic ascites. Free air is noted in right upper abdomen anteriorly axial images 110 and 127. Although findings may be due to recent instrumentation perforated viscus cannot be excluded. Clinical correlation is necessary. Musculoskeletal: No destructive bony lesions are noted. Degenerative changes thoracic spine. Sagittal view of the sternum is unremarkable. No destructive rib lesions are noted. Review of the MIP images confirms the above findings. IMPRESSION: 1. No pulmonary embolus is noted. 2. No mediastinal hematoma or adenopathy. 3. Cardiomegaly is noted. 4. Bilateral moderate size pleural effusion right greater than left. Bilateral lower lobe atelectasis. Mild atelectasis noted in lingula. No convincing pulmonary edema. 5. Small perihepatic ascites. There is free air in right upper abdomen anteriorly. Although this may be due to recent instrumentation perforated viscus cannot be excluded. Clinical correlation is necessary. These results were called by telephone at the time of interpretation on 01/06/2016 at 8:57 am to Dr. Loura Pardon , who verbally acknowledged these results. Electronically Signed   By: Lahoma Crocker M.D.   On: 12/16/2015 08:57   Ct Abdomen Pelvis W Contrast  01/06/2016  CLINICAL DATA:  Right upper quadrant abdominal pain. History of biloma. EXAM: CT ABDOMEN AND PELVIS WITH CONTRAST TECHNIQUE: Multidetector CT imaging of the abdomen and pelvis was performed using the standard protocol following bolus administration of intravenous contrast. CONTRAST:  89mL ISOVUE-300 IOPAMIDOL (ISOVUE-300) INJECTION 61% COMPARISON:  Chest CT - 12/22/2015; abdominal ultrasound - 12/21/2015 FINDINGS: Lower chest: Limited visualization of lower thorax demonstrates small to moderate  size bilateral effusions with associated atelectasis / collapse of the imaged portions of the bilateral lower lobes. Cardiomegaly. Coronary artery calcifications. The tip of a dialysis catheter terminates within the superior aspect of the right atrium. No pericardial effusion. Hepatobiliary: There is mild nodularity of the hepatic contour. No discrete hepatic lesions. Note is made of an approximately 1.8 x 1.5 cm gallstone within otherwise normal-appearing gallbladder. Small amount of intra-abdominal ascites. Pancreas: Normal appearance of the pancreas Spleen: Normal appearance of the spleen.  No evidence splenomegaly. Adrenals/Urinary Tract: There is symmetric enhancement of the bilateral kidneys. Renal cysts are seen bilaterally. No definite renal stones this postcontrast examination. No urine obstruction or perinephric stranding. Normal appearance of the bilateral adrenal glands. Normal appearance of the urinary bladder given underdistention. Several phleboliths are seen with the lower pelvis bilaterally, left greater than right. Stomach/Bowel: Small volume pneumoperitoneum as was demonstrated on chest CT performed earlier same day. Enteric contrast extends to the level of the mid small  bowel. While the exact etiology of the small bowel pneumoperitoneum is not definitely demonstrated on the present examination, there is abnormal thickening involving the descending portion of the duodenum (axial image 39, series 2; coronal image 81, series 6). Small volume intra-abdominal ascites. No definable / drainable fluid collection. The colon is underdistended however there is no evidence of enteric obstruction. Normal appearance of the terminal ileum. The appendix is not visualized, however there is no definitive pericecal inflammatory change. No pneumatosis or portal venous gas. Vascular/Lymphatic: Moderate amount of mixed calcified and noncalcified atherosclerotic plaque within a normal caliber abdominal aorta. The major  branch vessel of the abdominal aorta appear patent on this non CTA examination. Several large varicosities are noted about the gastric fundus (representative images 24, 26 and 30, series 2. 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 Reproductive: Post hysterectomy. No discrete adnexal lesion. Small to moderate amount of intra-abdominal ascites tracks into the pelvic cul-de-sac. Other: Diffuse body wall anasarca. Subcutaneous areas of ill-defined stranding about the left lower abdomen are likely this sites of subcutaneous medication administration. Musculoskeletal: No acute or aggressive osseous abnormalities. 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 Degenerative change of the bilateral hips. IMPRESSION: 1. Small volume pneumoperitoneum, the etiology of which is not depicted on this examination however there is apparent abnormal thickening involving the descending portion of the duodenum. Further evaluation with endoscopy could performed as clinically indicated. 2. No definable/drainable intra-abdominal fluid collection. 3. Findings suggestive of cirrhosis and portal venous hypertension with small volume intra-abdominal ascites, several moderate-sized gastric varices and evidence of a splenorenal shunt. No evidence splenomegaly. Further evaluation with endoscopy could be performed as clinically indicated. 4. Findings compatible with pulmonary edema with small to moderate sized bilateral effusions and diffuse body wall anasarca. 5. Atherosclerosis including coronary artery calcifications. 6. Cholelithiasis without evidence of cholecystitis. Critical Value/emergent results were called by telephone at the time of interpretation on 12/16/2015 at 2:21 pm to Dr. Caroleen Hamman , who verbally acknowledged these results. Electronically Signed   By: Sandi Mariscal M.D.   On:  12/15/2015 14:38   Dg Chest Port 1 View  01/06/2016  CLINICAL DATA:  Central line placement EXAM: PORTABLE CHEST 1 VIEW COMPARISON:  Earlier today FINDINGS: Moderate cardiomegaly. New left jugular central venous catheter place with its tip at the cavoatrial junction and no pneumothorax. NG tube placed with its tip in the stomach. Stable right jugular dialysis catheter. Bilateral pleural effusions and bilateral pulmonary opacity are stable. IMPRESSION: New left jugular central venous catheter place with its tip at the cavoatrial junction and no pneumothorax. NG tube has also been placed with its tip in the stomach. Otherwise stable. Electronically Signed   By: Marybelle Killings M.D.   On: 01/07/2016 16:52   Dg Chest Portable 1 View  12/14/2015  CLINICAL DATA:  75 year old female with atrial fibrillation, diabetes, and end-stage renal disease on dialysis with chest pain and shortness of breath EXAM: PORTABLE CHEST 1 VIEW COMPARISON:  Prior chest x-ray 11/14/2015 FINDINGS: Right IJ approach tunneled hemodialysis catheter. The catheter tip projects over the distal SVC. Stable cardiomegaly and mediastinal contours. There are small to moderate bilateral layering pleural effusions. Mild vascular congestion without overt edema. Bibasilar atelectasis. No evidence of pneumothorax. Osseous structures are intact and unremarkable. Atherosclerotic calcifications again noted in the transverse aorta. IMPRESSION: Right IJ approach tunneled hemodialysis catheter. The catheter tip  overlies the distal SVC. Stable cardiomegaly and bilateral layering pleural effusions with associated bibasilar atelectasis. Electronically Signed   By: Jacqulynn Cadet M.D.   On: 12/28/2015 08:00    LEFT IJ CVL 5/5>>>   MICRO DATA: MRSA PCR NEG Urine  Blood>> Resp   ANTIMICROBIALS:  zosyn 5/5>>> Vancomycin one dose 5/5   ASSESSMENT/PLAN  75 yo white female with multiple medical issues admitted to ICU for acute resp distress from sepsis  likley from  perforated viscus,  PATIENT IS DNR/DNI  PULMONARY Oxygen as needed  CARDIOVASCULAR Septic shock -vasopressors to keep MAP>65 -IVF's as prescribed -follow [facility]  RENAL ESRD on HD Follow up nephrology recs -will be started on CRRT  GASTROINTESTINAL CT abd/Pelvis Pending >>suggests perforated viscus  HEMATOLOGIC Follow CBC  INFECTIOUS Empiric abx for presumed perforated viscus -stop Vancomycin -PCR negative    I have personally obtained a history, examined the patient, evaluated laboratory and independently reviewed  imaging results, formulated the assessment and plan and placed orders.  I have personally confirmed that patient is DNR/DNI however, i have explained about reversing status if she were going to OR and if she comes back on Vent-will proceed with DNR status. Patient agreed and consented to this plan of acttion   The Patient requires high complexity decision making for assessment and support, frequent evaluation and titration of therapies, application of advanced monitoring technologies and extensive interpretation of multiple databases. Critical Care Time devoted to patient care services described in this note is 45 minutes.   Overall, patient is critically ill, prognosis is guarded. Patient at high risk for cardiac arrest and death.    Corrin Parker, M.D.  [facility]  Medical Director [place] Director [facility] [place] [facility]io[facility] Department

## 2015-12-18 NOTE — Progress Notes (Signed)
[facility]  ROUNDING NOTE   Subjective:  Patient has been followed very closely by surgery. It appears that her perioperative mortality risk is very high therefore surgery was deferred. She has multiple comorbidities including multiple myeloma, end-stage renal disease, ascites, portal venous hypertension, cirrhosis all of which increase her perioperative risk. However I had a discussion with her about renal replacement therapy in depth this a.m. She wishes to continue this at this period in time. I don't think that she would tolerate conventional dialysis at this moment.   Objective:  Vital signs in last 24 hours:  Temp:  [97.6 F (36.4 C)-98 F (36.7 C)] 98 F (36.7 C) (05/05 2100) Pulse Rate:  [94-129] 121 (05/06 0430) Resp:  [12-32] 14 (05/06 0430) BP: (74-125)/(41-101) 87/54 mmHg (05/06 0430) SpO2:  [84 %-98 %] 95 % (05/06 0430) Weight:  [92.3 kg (203 lb 7.8 oz)] 92.3 kg (203 lb 7.8 oz) (05/05 1042)  Weight change:  Filed Weights   12/26/2015 1042  Weight: 92.3 kg (203 lb 7.8 oz)    Intake/Output: I/O last 3 completed shifts: In: 453.2 [I.V.:243.2; NG/GT:135; IV Piggyback:75] Out: 500 [Emesis/NG output:500]   Intake/Output this shift:     Physical Exam: General: Critically ill appearing  Head: Normocephalic, atraumatic. Dry oral mucosal membranes. NG in place  Eyes: Anicteric  Neck: Supple, trachea midline  Lungs:  Diminished BS at bases, slightly increased work of breathing  Heart: S1S2 no rubs irregular tachycardic  Abdomen:  Some rigidity noted anteriorly, tender in b/l upper quadrants   Extremities: 3+ peripheral edema.  Neurologic: Nonfocal, moving all four extremities  Skin: No lesions  Access: R IJ PC in place    Basic Metabolic Panel:  Recent Labs Lab 01/12/2016 0620 12/18/15 0132  NA 133* 131*  K 4.2 4.6  CL 100* 101  CO2 23 18*  GLUCOSE 89 91  BUN 31* 36*  CREATININE 5.51* 5.86*  CALCIUM 8.1* 7.8*    Liver Function  Tests:  Recent Labs Lab 12/14/2015 0620 12/18/15 0132  AST 261* 221*  ALT 148* 153*  ALKPHOS 79 64  BILITOT 1.0 1.2  PROT 8.3* 7.5  ALBUMIN 1.7* 1.5*    Recent Labs Lab 12/14/2015 0620  LIPASE 17   No results for input(s): AMMONIA in the last 168 hours.  CBC:  Recent Labs Lab 12/19/2015 0620 12/18/15 0228  WBC 8.7 17.9*  NEUTROABS 6.2  --   HGB 11.3* 12.9  HCT 35.4 40.3  MCV 95.0 95.5  PLT 205 224    Cardiac Enzymes:  Recent Labs Lab 01/11/2016 0620 01/09/2016 1110 01/10/2016 2242  TROPONINI 0.53* 0.69* 0.70*    BNP: Invalid input(s): POCBNP  CBG:  Recent Labs Lab 01/08/2016 1039 12/29/2015 1730 12/15/2015 2019 01/05/2016 2333 12/18/15 0436  GLUCAP 81 80 85 17 107*    Microbiology: Results for orders placed or performed during the hospital encounter of 12/14/2015  MRSA PCR Screening     Status: None   Collection Time: 12/30/2015 11:04 AM  Result Value Ref Range Status   MRSA by PCR NEGATIVE NEGATIVE Final    Comment:        The GeneXpert MRSA Assay (FDA approved for NASAL specimens only), is one component of a comprehensive MRSA colonization surveillance program. It is not intended to diagnose MRSA infection nor to guide or monitor treatment for MRSA infections.     Coagulation Studies:  Recent Labs  01/01/2016 0620  LABPROT 19.3*  INR 1.62    Urinalysis:  Recent  Labs  01/05/2016 0742  COLORURINE RED*  LABSPEC 1.034*  PHURINE 5.0  GLUCOSEU NEGATIVE  HGBUR 1+*  BILIRUBINUR NEGATIVE  KETONESUR NEGATIVE  PROTEINUR 100*  NITRITE NEGATIVE  LEUKOCYTESUR 2+*      Imaging: Dg Abd 1 View  12/31/2015  CLINICAL DATA:  NG tube placement EXAM: ABDOMEN - 1 VIEW COMPARISON:  11/16/2015 FINDINGS: There is NG tube in place with tip in proximal stomach. Gaseous distended small bowel loops are noted mid abdomen suspicious for ileus or partial small bowel obstruction. IMPRESSION: NG tube with tip in proximal stomach. Gaseous distended small bowel loops mid  abdomen suspicious for ileus or partial bowel obstruction Electronically Signed   By: Lahoma Crocker M.D.   On: 01/10/2016 11:37   Ct Angio Chest Pe W/cm &/or Wo Cm  12/30/2015  CLINICAL DATA:  Possible pulmonary embolus, end-stage renal disease, shortness of Breath EXAM: CT ANGIOGRAPHY CHEST WITH CONTRAST TECHNIQUE: Multidetector CT imaging of the chest was performed using the standard protocol during bolus administration of intravenous contrast. Multiplanar CT image reconstructions and MIPs were obtained to evaluate the vascular anatomy. CONTRAST:  80 cc Isovue COMPARISON:  None. FINDINGS: Mediastinum/Lymph Nodes: Cardiomegaly is noted. Images of the thoracic inlet are unremarkable. Central airways are patent. Atherosclerotic calcifications of thoracic aorta. No pulmonary embolus is noted. No pericardial effusion. No hilar adenopathy is noted. A right IJ dialysis catheter is noted. Lungs/Pleura: There is bilateral moderate size pleural effusion right greater than left. Atelectasis noted bilateral lower lobe. Mild atelectasis noted in lingula. There is no convincing pulmonary edema. Upper abdomen: There is small perihepatic ascites. Free air is noted in right upper abdomen anteriorly axial images 110 and 127. Although findings may be due to recent instrumentation perforated viscus cannot be excluded. Clinical correlation is necessary. Musculoskeletal: No destructive bony lesions are noted. Degenerative changes thoracic spine. Sagittal view of the sternum is unremarkable. No destructive rib lesions are noted. Review of the MIP images confirms the above findings. IMPRESSION: 1. No pulmonary embolus is noted. 2. No mediastinal hematoma or adenopathy. 3. Cardiomegaly is noted. 4. Bilateral moderate size pleural effusion right greater than left. Bilateral lower lobe atelectasis. Mild atelectasis noted in lingula. No convincing pulmonary edema. 5. Small perihepatic ascites. There is free air in right upper abdomen  anteriorly. Although this may be due to recent instrumentation perforated viscus cannot be excluded. Clinical correlation is necessary. These results were called by telephone at the time of interpretation on 12/22/2015 at 8:57 am to Dr. Loura Pardon , who verbally acknowledged these results. Electronically Signed   By: Lahoma Crocker M.D.   On: 01/08/2016 08:57   Ct Abdomen Pelvis W Contrast  01/10/2016  CLINICAL DATA:  Right upper quadrant abdominal pain. History of biloma. EXAM: CT ABDOMEN AND PELVIS WITH CONTRAST TECHNIQUE: Multidetector CT imaging of the abdomen and pelvis was performed using the standard protocol following bolus administration of intravenous contrast. CONTRAST:  11m ISOVUE-300 IOPAMIDOL (ISOVUE-300) INJECTION 61% COMPARISON:  Chest CT - 12/21/2015; abdominal ultrasound - 01/11/2016 FINDINGS: Lower chest: Limited visualization of lower thorax demonstrates small to moderate size bilateral effusions with associated atelectasis / collapse of the imaged portions of the bilateral lower lobes. Cardiomegaly. Coronary artery calcifications. The tip of a dialysis catheter terminates within the superior aspect of the right atrium. No pericardial effusion. Hepatobiliary: There is mild nodularity of the hepatic contour. No discrete hepatic lesions. Note is made of an approximately 1.8 x 1.5 cm gallstone within otherwise normal-appearing gallbladder. Small amount of intra-abdominal  ascites. Pancreas: Normal appearance of the pancreas Spleen: Normal appearance of the spleen.  No evidence splenomegaly. Adrenals/Urinary Tract: There is symmetric enhancement of the bilateral kidneys. Renal cysts are seen bilaterally. No definite renal stones this postcontrast examination. No urine obstruction or perinephric stranding. Normal appearance of the bilateral adrenal glands. Normal appearance of the urinary bladder given underdistention. Several phleboliths are seen with the lower pelvis bilaterally, left greater than  right. Stomach/Bowel: Small volume pneumoperitoneum as was demonstrated on chest CT performed earlier same day. Enteric contrast extends to the level of the mid small bowel. While the exact etiology of the small bowel pneumoperitoneum is not definitely demonstrated on the present examination, there is abnormal thickening involving the descending portion of the duodenum (axial image 39, series 2; coronal image 81, series 6). Small volume intra-abdominal ascites. No definable / drainable fluid collection. The colon is underdistended however there is no evidence of enteric obstruction. Normal appearance of the terminal ileum. The appendix is not visualized, however there is no definitive pericecal inflammatory change. No pneumatosis or portal venous gas. Vascular/Lymphatic: Moderate amount of mixed calcified and noncalcified atherosclerotic plaque within a normal caliber abdominal aorta. The major branch vessel of the abdominal aorta appear patent on this non CTA examination. Several large varicosities are noted about the gastric fundus (representative images 24, 26 and 30, series 2. 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 Reproductive: Post hysterectomy. No discrete adnexal lesion. Small to moderate amount of intra-abdominal ascites tracks into the pelvic cul-de-sac. Other: Diffuse body wall anasarca. Subcutaneous areas of ill-defined stranding about the left lower abdomen are likely this sites of subcutaneous medication administration. Musculoskeletal: No acute or aggressive osseous abnormalities. 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 Degenerative change of the bilateral hips. IMPRESSION: 1. Small volume pneumoperitoneum, the etiology of which is not depicted on this examination however there is apparent abnormal thickening involving the descending portion of  the duodenum. Further evaluation with endoscopy could performed as clinically indicated. 2. No definable/drainable intra-abdominal fluid collection. 3. Findings suggestive of cirrhosis and portal venous hypertension with small volume intra-abdominal ascites, several moderate-sized gastric varices and evidence of a splenorenal shunt. No evidence splenomegaly. Further evaluation with endoscopy could be performed as clinically indicated. 4. Findings compatible with pulmonary edema with small to moderate sized bilateral effusions and diffuse body wall anasarca. 5. Atherosclerosis including coronary artery calcifications. 6. Cholelithiasis without evidence of cholecystitis. Critical Value/emergent results were called by telephone at the time of interpretation on 12/22/2015 at 2:21 pm to Dr. Caroleen Hamman , who verbally acknowledged these results. Electronically Signed   By: Sandi Mariscal M.D.   On: 01/05/2016 14:38   Dg Chest Port 1 View  12/30/2015  CLINICAL DATA:  Central line placement EXAM: PORTABLE CHEST 1 VIEW COMPARISON:  Earlier today FINDINGS: Moderate cardiomegaly. New left jugular central venous catheter place with its tip at the cavoatrial junction and no pneumothorax. NG tube placed with its tip in the stomach. Stable right jugular dialysis catheter. Bilateral pleural effusions and bilateral pulmonary opacity are stable. IMPRESSION: New left jugular central venous catheter place with its tip at the cavoatrial junction and no pneumothorax. NG tube has also been placed with its tip in the stomach. Otherwise stable. Electronically Signed   By: Marybelle Killings M.D.   On: 12/16/2015 16:52   Dg Chest Portable 1 View  01/11/2016  CLINICAL  DATA:  75 year old female with atrial fibrillation, diabetes, and end-stage renal disease on dialysis with chest pain and shortness of breath EXAM: PORTABLE CHEST 1 VIEW COMPARISON:  Prior chest x-ray 11/14/2015 FINDINGS: Right IJ approach tunneled hemodialysis catheter. The catheter  tip projects over the distal SVC. Stable cardiomegaly and mediastinal contours. There are small to moderate bilateral layering pleural effusions. Mild vascular congestion without overt edema. Bibasilar atelectasis. No evidence of pneumothorax. Osseous structures are intact and unremarkable. Atherosclerotic calcifications again noted in the transverse aorta. IMPRESSION: Right IJ approach tunneled hemodialysis catheter. The catheter tip overlies the distal SVC. Stable cardiomegaly and bilateral layering pleural effusions with associated bibasilar atelectasis. Electronically Signed   By: Jacqulynn Cadet M.D.   On: 12/30/2015 08:00   US Abdomen Limited Ruq  01/12/2016  CLINICAL DATA:  Right chest pain, shortness of breath. EXAM: US ABDOMEN LIMITED - RIGHT UPPER QUADRANT COMPARISON:  11/16/2015 FINDINGS: Gallbladder: Single large stone again noted within the gallbladder, measuring up to 2.1 cm. No wall thickening. Negative sonographic Murphy's. Common bile duct: Diameter: Normal caliber, 3 mm. Liver: No focal lesion identified. Within normal limits in parenchymal echogenicity. Incidentally noted is mild perihepatic ascites and right effusion IMPRESSION: Cholelithiasis.  No sonographic evidence of acute cholecystitis. Right effusion.  Perihepatic ascites. Electronically Signed   By: Rolm Baptise M.D.   On: 01/10/2016 07:19     Medications:   . norepinephrine 12 mcg/min (12/18/15 0620)  . pantoprozole (PROTONIX) infusion 8 mg/hr (12/18/15 0338)  . sterile water 1,000 mL with sodium acetate 150 mEq infusion 50 mL/hr at 01/12/2016 1710   . antiseptic oral rinse  7 mL Mouth Rinse BID  . barrier cream  1 application Topical TID  . docusate sodium  100 mg Oral QHS  . insulin aspart  0-5 Units Subcutaneous QHS  . insulin aspart  0-9 Units Subcutaneous TID WC  . [START ON 12/21/2015] pantoprazole (PROTONIX) IV  40 mg Intravenous Q12H  . piperacillin-tazobactam (ZOSYN)  IV  3.375 g Intravenous Q12H    acetaminophen **OR** acetaminophen, albuterol, bisacodyl, morphine injection, ondansetron **OR** ondansetron (ZOFRAN) IV, oxyCODONE, sodium phosphate  Assessment/ Plan:  75 y.o. female with atrial fibrillation, diabetes mellitus type II, hypertension, vertigo, coronary artery disease, who was admitted to [facility] on 11/10/2015 with left pleural effusion. Status post left thoracentesis on 11/11/15  1.  ESRD on HD due to untreated multiple myeloma/[facility]/MWF 3rd -  Patient has developed quite severe lactic acidosis. She would not tolerate conventional hemodialysis at this moment in time. However she wishes to continue renal replacement therapy. We have offered her continuous renal replacement therapy. We will start with a blood flow rate of 300, dialysate flow rate of 2.5 L per hour, and no ultrafiltration.  2. Hypotension.  Likely related to potential sepsis andfree intraperitoneal abdominal area.   - Continue pressor therapy to maintain a map of 65 or greater.  3.  Anemia of CKD/multiple myeloma.  Hemoglobin currently up to 12.9. Continue to monitor.   4.  Possible free intraperitoneal air:  Patient had dedicated CT scan of the abdomen and pelvis which showed some free air. Possible site of perforation may be in the duodenum.  5. Overall the patient has a very guarded prognosis.     LOS: 1 Bach Rocchi 5/6/20177:08 AM

## 2015-12-18 NOTE — Progress Notes (Signed)
eLink Physician-Brief Progress Note Patient Name: Lisa Lowery DOB: Jan 27, 1941 MRN: [id number]   Date of Service  12/18/2015  HPI/Events of Note  AFIB/AFLUTTER - I have been waiting for a STAT 12 lead EKG which has not been done as of this time. Will go ahead with therapy without confirmation from EKG.  eICU Interventions  Will order: 1. Amiodarone IV bolus and infusion.      Intervention Category Major Interventions: Arrhythmia - evaluation and management  [name] 12/18/2015, 5:58 PM

## 2015-12-18 NOTE — Progress Notes (Signed)
E- link MD notified of elevated HR in the 130's. Verbal order received to get a stat EKG as well as a BMP and Mag. Will continue to monitor closely.

## 2015-12-19 LAB — RENAL FUNCTION PANEL
ALBUMIN: 1.3 g/dL — AB (ref 3.5–5.0)
ALBUMIN: 1.3 g/dL — AB (ref 3.5–5.0)
ALBUMIN: 1.3 g/dL — AB (ref 3.5–5.0)
ANION GAP: 8 (ref 5–15)
ANION GAP: 9 (ref 5–15)
ANION GAP: 10 (ref 5–15)
Albumin: 1.2 g/dL — ABNORMAL LOW (ref 3.5–5.0)
Albumin: 1.3 g/dL — ABNORMAL LOW (ref 3.5–5.0)
Anion gap: 9 (ref 5–15)
Anion gap: 11 (ref 5–15)
BUN: 26 mg/dL — ABNORMAL HIGH (ref 6–20)
BUN: 27 mg/dL — ABNORMAL HIGH (ref 6–20)
BUN: 25 mg/dL — AB (ref 6–20)
BUN: 21 mg/dL — AB (ref 6–20)
BUN: 19 mg/dL (ref 6–20)
CALCIUM: 7.6 mg/dL — AB (ref 8.9–10.3)
CALCIUM: 7.6 mg/dL — AB (ref 8.9–10.3)
CALCIUM: 7.8 mg/dL — AB (ref 8.9–10.3)
CHLORIDE: 103 mmol/L (ref 101–111)
CHLORIDE: 102 mmol/L (ref 101–111)
CHLORIDE: 103 mmol/L (ref 101–111)
CO2: 24 mmol/L (ref 22–32)
CO2: 22 mmol/L (ref 22–32)
CO2: 23 mmol/L (ref 22–32)
CO2: 22 mmol/L (ref 22–32)
CO2: 20 mmol/L — AB (ref 22–32)
CREATININE: 3.34 mg/dL — AB (ref 0.44–1.00)
CREATININE: 2.57 mg/dL — AB (ref 0.44–1.00)
Calcium: 7.6 mg/dL — ABNORMAL LOW (ref 8.9–10.3)
Calcium: 7.6 mg/dL — ABNORMAL LOW (ref 8.9–10.3)
Chloride: 103 mmol/L (ref 101–111)
Chloride: 102 mmol/L (ref 101–111)
Creatinine, Ser: 3.97 mg/dL — ABNORMAL HIGH (ref 0.44–1.00)
Creatinine, Ser: 3.64 mg/dL — ABNORMAL HIGH (ref 0.44–1.00)
Creatinine, Ser: 2.89 mg/dL — ABNORMAL HIGH (ref 0.44–1.00)
GFR calc Af Amer: 17 mL/min — ABNORMAL LOW (ref >60)
GFR calc non Af Amer: 12 mL/min — ABNORMAL LOW (ref >60)
GFR calc non Af Amer: 15 mL/min — ABNORMAL LOW (ref >60)
GFR calc non Af Amer: 17 mL/min — ABNORMAL LOW (ref >60)
GFR, EST AFRICAN AMERICAN: 12 mL/min — AB (ref >60)
GFR, EST AFRICAN AMERICAN: 13 mL/min — AB (ref >60)
GFR, EST AFRICAN AMERICAN: 14 mL/min — AB (ref >60)
GFR, EST AFRICAN AMERICAN: 20 mL/min — AB (ref >60)
GFR, EST NON AFRICAN AMERICAN: 10 mL/min — AB (ref >60)
GFR, EST NON AFRICAN AMERICAN: 11 mL/min — AB (ref >60)
GLUCOSE: 95 mg/dL (ref 65–99)
GLUCOSE: 104 mg/dL — AB (ref 65–99)
Glucose, Bld: 97 mg/dL (ref 65–99)
Glucose, Bld: 92 mg/dL (ref 65–99)
Glucose, Bld: 95 mg/dL (ref 65–99)
PHOSPHORUS: 4.3 mg/dL (ref 2.5–4.6)
PHOSPHORUS: 4 mg/dL (ref 2.5–4.6)
POTASSIUM: 4.3 mmol/L (ref 3.5–5.1)
POTASSIUM: 4.3 mmol/L (ref 3.5–5.1)
Phosphorus: 4.3 mg/dL (ref 2.5–4.6)
Phosphorus: 4 mg/dL (ref 2.5–4.6)
Phosphorus: 4.6 mg/dL (ref 2.5–4.6)
Potassium: 4.4 mmol/L (ref 3.5–5.1)
Potassium: 4.6 mmol/L (ref 3.5–5.1)
Potassium: 4.8 mmol/L (ref 3.5–5.1)
SODIUM: 134 mmol/L — AB (ref 135–145)
SODIUM: 134 mmol/L — AB (ref 135–145)
SODIUM: 134 mmol/L — AB (ref 135–145)
Sodium: 135 mmol/L (ref 135–145)
Sodium: 134 mmol/L — ABNORMAL LOW (ref 135–145)

## 2015-12-19 LAB — CBC WITH DIFFERENTIAL/PLATELET
Basophils Absolute: 0 10*3/uL (ref 0–0.1)
EOS ABS: 0 10*3/uL (ref 0–0.7)
HCT: 36.2 % (ref 35.0–47.0)
HEMOGLOBIN: 11.7 g/dL — AB (ref 12.0–16.0)
LYMPHS ABS: 0.4 10*3/uL — AB (ref 1.0–3.6)
Lymphocytes Relative: 3 %
MCH: 30.6 pg (ref 26.0–34.0)
MCHC: 32.5 g/dL (ref 32.0–36.0)
MCV: 94.3 fL (ref 80.0–100.0)
MONO ABS: 0.8 10*3/uL (ref 0.2–0.9)
Neutro Abs: 14.7 10*3/uL — ABNORMAL HIGH (ref 1.4–6.5)
PLATELETS: 100 10*3/uL — AB (ref 150–440)
RBC: 3.84 MIL/uL (ref 3.80–5.20)
RDW: 19.9 % — AB (ref 11.5–14.5)
WBC: 15.9 10*3/uL — ABNORMAL HIGH (ref 3.6–11.0)

## 2015-12-19 LAB — GLUCOSE, CAPILLARY
GLUCOSE-CAPILLARY: 93 mg/dL (ref 65–99)
GLUCOSE-CAPILLARY: 92 mg/dL (ref 65–99)
Glucose-Capillary: 85 mg/dL (ref 65–99)
Glucose-Capillary: 86 mg/dL (ref 65–99)

## 2015-12-19 LAB — LACTIC ACID, PLASMA
LACTIC ACID, VENOUS: 3.8 mmol/L — AB (ref 0.5–2.0)
Lactic Acid, Venous: 3.4 mmol/L (ref 0.5–2.0)

## 2015-12-19 MED ORDER — ALTEPLASE 100 MG IV SOLR: 2.0000 mg | Freq: Once | INTRAVENOUS | Status: DC

## 2015-12-19 MED ORDER — AMIODARONE HCL IN DEXTROSE 360-4.14 MG/200ML-% IV SOLN
30.0000 mg/h | INTRAVENOUS | Status: DC
  Administered 2015-12-19 – 2015-12-20 (×2): 30 mg/h via INTRAVENOUS
  Filled 2015-12-19 (×3): qty 200

## 2015-12-19 MED ORDER — DEXTROSE 5 % IV SOLN
2.0000 ug/min | INTRAVENOUS | Status: DC
  Administered 2015-12-19: 16 ug/min via INTRAVENOUS
  Administered 2015-12-20: 40 ug/min via INTRAVENOUS
  Filled 2015-12-19 (×2): qty 16

## 2015-12-19 MED ORDER — SODIUM CHLORIDE 0.9 % IV BOLUS (SEPSIS)
250.0000 mL | Freq: Once | INTRAVENOUS | Status: AC
  Administered 2015-12-19: 250 mL via INTRAVENOUS

## 2015-12-19 MED ORDER — HYDROCORTISONE NA SUCCINATE PF 100 MG IJ SOLR
50.0000 mg | Freq: Four times a day (QID) | INTRAMUSCULAR | Status: DC
Start: 2015-12-19 — End: 2015-12-20
  Administered 2015-12-19 – 2015-12-20 (×4): 50 mg via INTRAVENOUS
  Filled 2015-12-19 (×4): qty 2

## 2015-12-19 MED ORDER — STERILE WATER FOR INJECTION IJ SOLN
INTRAMUSCULAR | Status: AC
  Administered 2015-12-19: 08:00:00
  Filled 2015-12-19: qty 10

## 2015-12-19 MED ORDER — AMIODARONE HCL IN DEXTROSE 360-4.14 MG/200ML-% IV SOLN
60.0000 mg/h | INTRAVENOUS | Status: AC
  Administered 2015-12-19: 60 mg/h via INTRAVENOUS
  Filled 2015-12-19: qty 200

## 2015-12-19 MED ORDER — ALTEPLASE 2 MG IJ SOLR
2.0000 mg | Freq: Once | INTRAMUSCULAR | Status: AC
  Administered 2015-12-19: 2 mg
  Filled 2015-12-19: qty 2

## 2015-12-19 MED ORDER — VASOPRESSIN 20 UNIT/ML IV SOLN
0.0300 [IU]/min | INTRAVENOUS | Status: DC
  Administered 2015-12-19 – 2015-12-20 (×2): 0.03 [IU]/min via INTRAVENOUS
  Filled 2015-12-19 (×2): qty 2

## 2015-12-19 NOTE — Progress Notes (Signed)
Dr. Mortimer Fries notified of critical lactic acid value of 3.4 (was 4.5 yesterday). MD reviewed patient condition with RN, including CVPs, lung sounds and chest x-ray yesterday, edema, current infusions, etc... MD ordered 250 mL IV bolus of normal saline. Team will continue to monitor. Lactic acid values currently repeating every 3 hours.

## 2015-12-19 NOTE — Progress Notes (Signed)
LCSW met with patient and she reports she is not feeling well today. LCSW encouraged patient to rest. Consulted with nurses and provided number for son Delfino Lovett to call LCSW when he comes in.  Mariela Rex LCSW

## 2015-12-19 NOTE — Progress Notes (Signed)
Spoke with Dr. Mortimer Fries about patient's continued elevated heart rate in atrial fibrillation. MD reviewed patient condition with RN and ordered amiodarone IV infusion.

## 2015-12-19 NOTE — Plan of Care (Signed)
Problem: Health Behavior/Discharge Planning: Goal: Ability to manage health-related needs will improve Outcome: Not Progressing Patient this AM had to be titrated upward on levo. Current rate is 16 mcg. Patient started on vasopressin as well for BP support. Amiodarone started per Dr. Mortimer Fries to decrease HR. Patient currently in afib with rates in the 120's. Lactic Acid this AM was 3.4. On repeat lactic, value had raised to 3.8. Dr. Mortimer Fries informed and no further interventions ordered. Currently on 2 lpm Young, lungs still diminished on both sides. Will continue to monitor.

## 2015-12-19 NOTE — Consult Note (Signed)
[facility] Consultation      Name: Lisa Lowery MRN: [id number] DOB: 1941-01-05    ADMISSION DATE:  12/24/2015 CONSULTATION DATE:  01/11/2016 REFERRING MD :  Dr Ether Griffins  CHIEF COMPLAINT:  Acute resp distress and abd pain   HISTORY OF PRESENT ILLNESS  Remains critically ill, lethargic but arousable, started on vasopressors, still with abd pain/tenderenss +rebound tenderness  ROS limited due to letharguyc LIMITED ROS Review of Systems  Constitutional: Positive for malaise/fatigue and diaphoresis.  HENT: Negative for congestion.   Eyes: Negative for blurred vision.  Respiratory: Positive for shortness of breath.   Cardiovascular: Positive for leg swelling.  Gastrointestinal: Positive for nausea, vomiting and abdominal pain.  Genitourinary: Negative for dysuria.  Skin: Negative for rash.  Neurological: Positive for dizziness and weakness. Negative for headaches.  Psychiatric/Behavioral: The patient is nervous/anxious.   All other systems reviewed and are negative.     VITAL SIGNS    Temp:  [97.6 F (36.4 C)-98.2 F (36.8 C)] 98 F (36.7 C) (05/07 0400) Pulse Rate:  [93-141] 119 (05/07 0700) Resp:  [11-29] 19 (05/07 0700) BP: (76-141)/(43-117) 89/59 mmHg (05/07 0700) SpO2:  [87 %-100 %] 100 % (05/07 0700) Weight:  [211 lb 10.3 oz (96 kg)-219 lb 12.8 oz (99.7 kg)] 219 lb 12.8 oz (99.7 kg) (05/07 0435) HEMODYNAMICS: CVP:  [10 mmHg-20 mmHg] 20 mmHg VENTILATOR SETTINGS:   INTAKE / OUTPUT:  Intake/Output Summary (Last 24 hours) at 12/19/15 0735 Last data filed at 12/19/15 0700  Gross per 24 hour  Intake 2716.1 ml  Output      0 ml  Net 2716.1 ml       PHYSICAL EXAM   Physical Exam  Constitutional: She is oriented to person, place, and time. She appears well-developed and well-nourished. She appears distressed.  HENT:  Head: Normocephalic and atraumatic.  Mouth/Throat: No oropharyngeal exudate.  Eyes: EOM are normal. Pupils are equal,  round, and reactive to light. No scleral icterus.  Neck: Normal range of motion. Neck supple.  Cardiovascular: Normal rate, regular rhythm and normal heart sounds.   No murmur heard. Pulmonary/Chest: No stridor. She is in respiratory distress. She has no wheezes. She has rales.  Abdominal: She exhibits distension. There is tenderness.  distended  Musculoskeletal: Normal range of motion. She exhibits no edema.  Neurological: She is alert and oriented to person, place, and time. No cranial nerve deficit.  Skin: Skin is warm. She is diaphoretic.  Psychiatric: She has a normal mood and affect.       LABS   LABS:  CBC  Recent Labs Lab 12/19/2015 0620 12/18/15 0228 12/19/15 0531  WBC 8.7 17.9* 15.9*  HGB 11.3* 12.9 11.7*  HCT 35.4 40.3 36.2  PLT 205 224 100*   Coag's  Recent Labs Lab 01/12/2016 0620  APTT 33  INR 1.62   BMET  Recent Labs Lab 12/18/15 2137 12/19/15 0129 12/19/15 0531  NA 133* 135 134*  K 4.3 4.3 4.3  CL 101 103 103  CO2 24 24 22   BUN 27* 26* 27*  CREATININE 4.10* 3.97* 3.64*  GLUCOSE 97 97 95   Electrolytes  Recent Labs Lab 12/18/15 1735 12/18/15 2137 12/19/15 0129 12/19/15 0531  CALCIUM 7.6* 7.5* 7.6* 7.6*  MG 1.8  --   --   --   PHOS  --  4.4 4.3 4.3   Sepsis Markers  Recent Labs Lab 12/27/2015 1833 12/15/2015 2242 12/18/15 0132  LATICACIDVEN 5.3* 4.5* 4.5*   ABG No results for  input(s): PHART, PCO2ART, PO2ART in the last 168 hours. Liver Enzymes  Recent Labs Lab 12/29/2015 0620 12/18/15 0132  12/18/15 2137 12/19/15 0129 12/19/15 0531  AST 261* 221*  --   --   --   --   ALT 148* 153*  --   --   --   --   ALKPHOS 79 64  --   --   --   --   BILITOT 1.0 1.2  --   --   --   --   ALBUMIN 1.7* 1.5*  < > 1.3* 1.2* 1.3*  < > = values in this interval not displayed. Cardiac Enzymes  Recent Labs Lab 01/03/2016 0620 01/02/2016 1110 01/12/2016 2242  TROPONINI 0.53* 0.69* 0.70*   Glucose  Recent Labs Lab 12/18/15 0436  12/18/15 0741 12/18/15 1145 12/18/15 1624 12/18/15 2119 12/19/15 0722  GLUCAP 107* 95 105* 91 82 85     Recent Results (from the past 240 hour(s))  Urine culture     Status: None (Preliminary result)   Collection Time: 12/15/2015  7:42 AM  Result Value Ref Range Status   Specimen Description URINE, CATHETERIZED  Final   Special Requests NONE  Final   Culture TOO YOUNG TO READ  Final   Report Status PENDING  Incomplete  CULTURE, BLOOD (ROUTINE X 2) w Reflex to PCR ID Panel     Status: None (Preliminary result)   Collection Time: 01/08/2016  9:33 AM  Result Value Ref Range Status   Specimen Description BLOOD RIGHT FOREARM  Final   Special Requests BOTTLES DRAWN AEROBIC AND ANAEROBIC  2CC  Final   Culture NO GROWTH 1 DAY  Final   Report Status PENDING  Incomplete  CULTURE, BLOOD (ROUTINE X 2) w Reflex to PCR ID Panel     Status: None (Preliminary result)   Collection Time: 01/04/2016  9:33 AM  Result Value Ref Range Status   Specimen Description BLOOD LEFT WRIST  Final   Special Requests BOTTLES DRAWN AEROBIC AND ANAEROBIC  1CC  Final   Culture NO GROWTH 1 DAY  Final   Report Status PENDING  Incomplete  MRSA PCR Screening     Status: None   Collection Time: 12/26/2015 11:04 AM  Result Value Ref Range Status   MRSA by PCR NEGATIVE NEGATIVE Final    Comment:        The GeneXpert MRSA Assay (FDA approved for NASAL specimens only), is one component of a comprehensive MRSA colonization surveillance program. It is not intended to diagnose MRSA infection nor to guide or monitor treatment for MRSA infections.      Current facility-administered medications:  .  acetaminophen (TYLENOL) tablet 650 mg, 650 mg, Oral, Q6H PRN **OR** acetaminophen (TYLENOL) suppository 650 mg, 650 mg, Rectal, Q6H PRN, Theodoro Grist, MD .  albuterol (PROVENTIL) (2.5 MG/3ML) 0.083% nebulizer solution 2.5 mg, 2.5 mg, Nebulization, Q4H PRN, Theodoro Grist, MD .  amiodarone (NEXTERONE) 1.8 mg/mL load via  infusion 150 mg, 150 mg, Intravenous, Once **FOLLOWED BY** [EXPIRED] amiodarone (NEXTERONE PREMIX) 360-4.14 MG/200ML-% (1.8 mg/mL) IV infusion, 60 mg/hr, Intravenous, Continuous, Stopped at 12/18/15 1900 **FOLLOWED BY** amiodarone (NEXTERONE PREMIX) 360-4.14 MG/200ML-% (1.8 mg/mL) IV infusion, 30 mg/hr, Intravenous, Continuous, Anders Simmonds, MD, Stopped at 12/19/15 0030 .  antiseptic oral rinse (CPC / CETYLPYRIDINIUM CHLORIDE 0.05%) solution 7 mL, 7 mL, Mouth Rinse, BID, Diego F Pabon, MD, 7 mL at 12/18/15 2120 .  barrier cream (non-specified) 1 application, 1 application, Topical, TID, Theodoro Grist, MD, 1  application at [id number] .  bisacodyl (DULCOLAX) suppository 10 mg, 10 mg, Rectal, Daily PRN, Theodoro Grist, MD .  docusate sodium (COLACE) capsule 100 mg, 100 mg, Oral, QHS, Theodoro Grist, MD, 100 mg at 12/18/15 2120 .  heparin injection 1,000-6,000 Units, 1,000-6,000 Units, CRRT, PRN, Munsoor Lateef, MD, 1,500 Units at 12/19/15 0726 .  insulin aspart (novoLOG) injection 0-5 Units, 0-5 Units, Subcutaneous, QHS, Theodoro Grist, MD, 0 Units at 12/24/2015 2116 .  insulin aspart (novoLOG) injection 0-9 Units, 0-9 Units, Subcutaneous, TID WC, Theodoro Grist, MD, 0 Units at 01/04/2016 1114 .  morphine 2 MG/ML injection 2 mg, 2 mg, Intravenous, Q4H PRN, Theodoro Grist, MD .  norepinephrine (LEVOPHED) 4mg  in D5W 276mL premix infusion, 0-40 mcg/min, Intravenous, Titrated, Theodoro Grist, MD, Last Rate: 26.3 mL/hr at 12/19/15 0700, 7.013 mcg/min at 12/19/15 0700 .  ondansetron (ZOFRAN) tablet 4 mg, 4 mg, Oral, Q6H PRN **OR** ondansetron (ZOFRAN) injection 4 mg, 4 mg, Intravenous, Q6H PRN, Theodoro Grist, MD, 4 mg at 12/18/15 1508 .  oxyCODONE (Oxy IR/ROXICODONE) immediate release tablet 5 mg, 5 mg, Oral, Q4H PRN, Theodoro Grist, MD .  pantoprazole (PROTONIX) 80 mg in sodium chloride 0.9 % 250 mL (0.32 mg/mL) infusion, 8 mg/hr, Intravenous, Continuous, Diego F Pabon, MD, Last Rate: 25 mL/hr at 12/19/15 0700, 8  mg/hr at 12/19/15 0700 .  [START ON 12/21/2015] pantoprazole (PROTONIX) injection 40 mg, 40 mg, Intravenous, Q12H, Diego F Pabon, MD .  piperacillin-tazobactam (ZOSYN) IVPB 3.375 g, 3.375 g, Intravenous, Q8H, Sheema M Hallaji, RPH, 3.375 g at 12/19/15 0559 .  pureflow IV solution for Dialysis, , CRRT, Continuous, Munsoor Lateef, MD, Last Rate: 2,500 mL/hr at 12/19/15 0300 .  sodium phosphate (FLEET) 7-19 GM/118ML enema 1 enema, 1 enema, Rectal, Once PRN, Theodoro Grist, MD .  sterile water 1,000 mL with sodium acetate 150 mEq infusion, , Intravenous, Continuous, Theodoro Grist, MD, Last Rate: 50 mL/hr at 12/19/15 0700  IMAGING    No results found.  LEFT IJ CVL 5/5>>>   MICRO DATA: MRSA PCR NEG Urine  Blood>> Resp   ANTIMICROBIALS:  zosyn 5/5>>> Vancomycin one dose 5/5   ASSESSMENT/PLAN  75 yo white female with multiple medical issues admitted to ICU for acute resp distress from sepsis likley from  perforated viscus,  PATIENT IS DNR/DNI  PULMONARY Oxygen as needed  CARDIOVASCULAR Septic shock -vasopressors to keep MAP>65 -IVF's as prescribed Follow LA -will consider stress dose steroids  RENAL ESRD on HD Follow up nephrology recs -now on CRRT  GASTROINTESTINAL CT abd/Pelvis >>suggests perforated viscus  HEMATOLOGIC Follow CBC  INFECTIOUS Empiric abx for presumed perforated viscus -stop Vancomycin -PCR negative    I have personally obtained a history, examined the patient, evaluated laboratory and independently reviewed  imaging results, formulated the assessment and plan and placed orders.  I have personally confirmed that patient is DNR/DNI however, i have explained about reversing status if she were going to OR and if she comes back on Vent-will proceed with DNR status. Patient agreed and consented to this plan of acttion   The Patient requires high complexity decision making for assessment and support, frequent evaluation and titration of therapies,  application of advanced monitoring technologies and extensive interpretation of multiple databases. Critical Care Time devoted to patient care services described in this note is 35 minutes.   Overall, patient is critically ill, prognosis is guarded. Patient at high risk for cardiac arrest and death.    Corrin Parker, M.D.  [facility]  Medical Director [place] Director [facility] Cardio-Pulmonary Department

## 2015-12-19 NOTE — Progress Notes (Signed)
CC: Shock, pneumoperitoneum, likely perforated duodenum Subjective: Arousable and responsive. Currently denies any abdominal pain. Per nursing her pressures had increased slightly overnight however stable currently. CRRT was stopped overnight but plans to be restarted today.  Objective: Vital signs in last 24 hours: Temp:  [97.6 F (36.4 C)-98 F (36.7 C)] 97.6 F (36.4 C) (05/07 0800) Pulse Rate:  [93-141] 126 (05/07 1100) Resp:  [9-29] 9 (05/07 1100) BP: (67-141)/(43-117) 67/50 mmHg (05/07 1100) SpO2:  [87 %-100 %] 100 % (05/07 1100) Weight:  [99.7 kg (219 lb 12.8 oz)] 99.7 kg (219 lb 12.8 oz) (05/07 0435) Last BM Date: 12/13/2015  Intake/Output from previous day: 05/06 0701 - 05/07 0700 In: 2716.1 [I.V.:2516.1; NG/GT:50; IV Piggyback:150] Out: 0  Intake/Output this shift: Total I/O In: 649.1 [I.V.:379.1; Other:250; NG/GT:20] Out: 0   Physical exam:  Gen.: Alert Chest: Clear to auscultation Heart: Tachycardic Abdomen: Soft, minimally distended, minimal tenderness to deep palpation. No rebound, guarding, evidence of peritonitis  Lab Results: CBC   Recent Labs  12/18/15 0228 12/19/15 0531  WBC 17.9* 15.9*  HGB 12.9 11.7*  HCT 40.3 36.2  PLT 224 100*   BMET  Recent Labs  12/19/15 0531 12/19/15 1032  NA 134* 134*  K 4.3 4.4  CL 103 102  CO2 22 23  GLUCOSE 95 92  BUN 27* 25*  CREATININE 3.64* 3.34*  CALCIUM 7.6* 7.6*   PT/INR  Recent Labs  12/26/2015 0620  LABPROT 19.3*  INR 1.62   ABG No results for input(s): PHART, HCO3 in the last 72 hours.  Invalid input(s): PCO2, PO2  Studies/Results: Ct Abdomen Pelvis W Contrast  12/13/2015  CLINICAL DATA:  Right upper quadrant abdominal pain. History of biloma. EXAM: CT ABDOMEN AND PELVIS WITH CONTRAST TECHNIQUE: Multidetector CT imaging of the abdomen and pelvis was performed using the standard protocol following bolus administration of intravenous contrast. CONTRAST:  49mL ISOVUE-300 IOPAMIDOL (ISOVUE-300)  INJECTION 61% COMPARISON:  Chest CT - 01/01/2016; abdominal ultrasound - 01/07/2016 FINDINGS: Lower chest: Limited visualization of lower thorax demonstrates small to moderate size bilateral effusions with associated atelectasis / collapse of the imaged portions of the bilateral lower lobes. Cardiomegaly. Coronary artery calcifications. The tip of a dialysis catheter terminates within the superior aspect of the right atrium. No pericardial effusion. Hepatobiliary: There is mild nodularity of the hepatic contour. No discrete hepatic lesions. Note is made of an approximately 1.8 x 1.5 cm gallstone within otherwise normal-appearing gallbladder. Small amount of intra-abdominal ascites. Pancreas: Normal appearance of the pancreas Spleen: Normal appearance of the spleen.  No evidence splenomegaly. Adrenals/Urinary Tract: There is symmetric enhancement of the bilateral kidneys. Renal cysts are seen bilaterally. No definite renal stones this postcontrast examination. No urine obstruction or perinephric stranding. Normal appearance of the bilateral adrenal glands. Normal appearance of the urinary bladder given underdistention. Several phleboliths are seen with the lower pelvis bilaterally, left greater than right. Stomach/Bowel: Small volume pneumoperitoneum as was demonstrated on chest CT performed earlier same day. Enteric contrast extends to the level of the mid small bowel. While the exact etiology of the small bowel pneumoperitoneum is not definitely demonstrated on the present examination, there is abnormal thickening involving the descending portion of the duodenum (axial image 39, series 2; coronal image 81, series 6). Small volume intra-abdominal ascites. No definable / drainable fluid collection. The colon is underdistended however there is no evidence of enteric obstruction. Normal appearance of the terminal ileum. The appendix is not visualized, however there is no definitive pericecal inflammatory change.  No  pneumatosis or portal venous gas. Vascular/Lymphatic: Moderate amount of mixed calcified and noncalcified atherosclerotic plaque within a normal caliber abdominal aorta. The major branch vessel of the abdominal aorta appear patent on this non CTA examination. Several large varicosities are noted about the gastric fundus (representative images 24, 26 and 30, series 2. 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 Reproductive: Post hysterectomy. No discrete adnexal lesion. Small to moderate amount of intra-abdominal ascites tracks into the pelvic cul-de-sac. Other: Diffuse body wall anasarca. Subcutaneous areas of ill-defined stranding about the left lower abdomen are likely this sites of subcutaneous medication administration. Musculoskeletal: No acute or aggressive osseous abnormalities. 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 Degenerative change of the bilateral hips. IMPRESSION: 1. Small volume pneumoperitoneum, the etiology of which is not depicted on this examination however there is apparent abnormal thickening involving the descending portion of the duodenum. Further evaluation with endoscopy could performed as clinically indicated. 2. No definable/drainable intra-abdominal fluid collection. 3. Findings suggestive of cirrhosis and portal venous hypertension with small volume intra-abdominal ascites, several moderate-sized gastric varices and evidence of a splenorenal shunt. No evidence splenomegaly. Further evaluation with endoscopy could be performed as clinically indicated. 4. Findings compatible with pulmonary edema with small to moderate sized bilateral effusions and diffuse body wall anasarca. 5. Atherosclerosis including coronary artery calcifications. 6. Cholelithiasis without evidence of cholecystitis. Critical Value/emergent results were called by  telephone at the time of interpretation on 12/22/2015 at 2:21 pm to Dr. Caroleen Hamman , who verbally acknowledged these results. Electronically Signed   By: Sandi Mariscal M.D.   On: 01/07/2016 14:38   Dg Chest Port 1 View  12/31/2015  CLINICAL DATA:  Central line placement EXAM: PORTABLE CHEST 1 VIEW COMPARISON:  Earlier today FINDINGS: Moderate cardiomegaly. New left jugular central venous catheter place with its tip at the cavoatrial junction and no pneumothorax. NG tube placed with its tip in the stomach. Stable right jugular dialysis catheter. Bilateral pleural effusions and bilateral pulmonary opacity are stable. IMPRESSION: New left jugular central venous catheter place with its tip at the cavoatrial junction and no pneumothorax. NG tube has also been placed with its tip in the stomach. Otherwise stable. Electronically Signed   By: Marybelle Killings M.D.   On: 12/27/2015 16:52    Anti-infectives: Anti-infectives    Start     Dose/Rate Route Frequency Ordered Stop   2016-01-10 1200  vancomycin (VANCOCIN) IVPB 750 mg/150 ml premix  Status:  Discontinued     750 mg 150 mL/hr over 60 Minutes Intravenous Every M-W-F (Hemodialysis) 01/02/2016 1241 12/23/2015 1500   12/18/15 1400  piperacillin-tazobactam (ZOSYN) IVPB 3.375 g     3.375 g 12.5 mL/hr over 240 Minutes Intravenous Every 8 hours 12/18/15 0727     12/31/2015 1730  piperacillin-tazobactam (ZOSYN) IVPB 3.375 g  Status:  Discontinued     3.375 g 12.5 mL/hr over 240 Minutes Intravenous Every 12 hours 12/30/2015 1239 12/18/15 0727   01/04/2016 1245  vancomycin (VANCOCIN) IVPB 750 mg/150 ml premix     750 mg 150 mL/hr over 60 Minutes Intravenous  Once 12/19/2015 1239 01/01/2016 1451   12/19/2015 0900  cefTRIAXone (ROCEPHIN) 1 g in dextrose 5 % 50 mL IVPB  Status:  Discontinued     1 g 100 mL/hr over 30 Minutes Intravenous Every 24 hours 12/22/2015 0857 12/30/2015 0900   12/28/2015 0900  vancomycin (VANCOCIN) IVPB 1000 mg/200 mL premix     1,000 mg 200 mL/hr over 60 Minutes  Intravenous  Once 12/22/2015 0859 01/06/2016 1034   12/15/2015 0900  piperacillin-tazobactam (ZOSYN) IVPB 3.375 g     3.375 g 12.5 mL/hr over 240 Minutes Intravenous  Once 12/16/2015 0859 12/18/2015 1006      Assessment/Plan:  75 year old female with likely perforated duodenal ulcer but without evidence of frank peritonitis. Continues to be difficult situation as operative intervention would carry an extreme high mortality. Recommend continuing nonoperative management at this time. Continue IV antibiotics, NG tube decompressionn continue CRRT, vasopressor and supportive measures per nephrology and critical care team. Surgery will continue to follow with you.  Davin Archuletta T. Adonis Huguenin, MD, FACS  12/19/2015

## 2015-12-19 NOTE — Progress Notes (Signed)
  Had long conversation with the patient's son. Discussed at length the very limited indications for surgical intervention in his mother. Patient stated very clearly that if surgery could save her life that she would want it.  The primary indication of possibly proceeding to surgery would be a sudden development of large volume pneumoperitoneum that would indicate a free perforation of her suspected duodenal ulcer. However, should she not have meaningful recovery of cardiac or renal function, the probability of surviving any operative intervention is remote.  All of the son's questions were answered.  Surgery will continue to follow closely.  Clayburn Pert, MD FACS General Surgeon [facility]

## 2015-12-19 NOTE — Progress Notes (Signed)
MEDICATION RELATED CONSULT NOTE -follow up  Pharmacy Consult for Dose Adjustment for CRRT   No Known Allergies  Patient Measurements: Height: 5' 7"  (170.2 cm) Weight: 219 lb 12.8 oz (99.7 kg) IBW/kg (Calculated) : 61.6  Vital Signs: Temp: 97.6 F (36.4 C) (05/07 0800) Temp Source: Oral (05/07 0800) BP: 67/50 mmHg (05/07 1100) Pulse Rate: 126 (05/07 1100) Intake/Output from previous day: 05/06 0701 - 05/07 0700 In: 2716.1 [I.V.:2516.1; NG/GT:50; IV Piggyback:150] Out: 0   Labs:  Recent Labs  01/11/2016 0620 12/18/15 0132 12/18/15 0228  12/18/15 1735  12/19/15 0129 12/19/15 0531 12/19/15 1032  WBC 8.7  --  17.9*  --   --   --   --  15.9*  --   HGB 11.3*  --  12.9  --   --   --   --  11.7*  --   HCT 35.4  --  40.3  --   --   --   --  36.2  --   PLT 205  --  224  --   --   --   --  100*  --   APTT 33  --   --   --   --   --   --   --   --   CREATININE 5.51* 5.86*  --   < > 4.35*  < > 3.97* 3.64* 3.34*  MG  --   --   --   --  1.8  --   --   --   --   PHOS  --   --   --   < >  --   < > 4.3 4.3 4.0  ALBUMIN 1.7* 1.5*  --   < >  --   < > 1.2* 1.3* 1.3*  PROT 8.3* 7.5  --   --   --   --   --   --   --   AST 261* 221*  --   --   --   --   --   --   --   ALT 148* 153*  --   --   --   --   --   --   --   ALKPHOS 79 64  --   --   --   --   --   --   --   BILITOT 1.0 1.2  --   --   --   --   --   --   --   < > = values in this interval not displayed. Estimated Creatinine Clearance: 17.4 mL/min (by C-G formula based on Cr of 3.34).  Medical History: Past Medical History  Diagnosis Date  . Vertigo   . Hypertension   . Diabetes ([place])   . A-fib ([place])   . Multiple myeloma ([place])   . Renal insufficiency     Medications:  Scheduled:  . amiodarone  150 mg Intravenous Once  . antiseptic oral rinse  7 mL Mouth Rinse BID  . barrier cream  1 application Topical TID  . docusate sodium  100 mg Oral QHS  . hydrocortisone sod succinate (SOLU-CORTEF) inj  50 mg Intravenous Q6H  .  insulin aspart  0-5 Units Subcutaneous QHS  . insulin aspart  0-9 Units Subcutaneous TID WC  . [START ON 12/21/2015] pantoprazole (PROTONIX) IV  40 mg Intravenous Q12H  . piperacillin-tazobactam (ZOSYN)  IV  3.375 g Intravenous Q8H   Infusions:  . amiodarone Stopped (  12/19/15 0030)  . norepinephrine 20 mcg/min (12/19/15 1115)  . pantoprozole (PROTONIX) infusion 8 mg/hr (12/19/15 1100)  . pureflow 2,500 mL/hr at 12/19/15 1048  . sterile water 1,000 mL with sodium acetate 150 mEq infusion 50 mL/hr at 12/19/15 0800  . vasopressin (PITRESSIN) infusion - *FOR SHOCK*     PRN: acetaminophen **OR** acetaminophen, albuterol, bisacodyl, heparin, morphine injection, ondansetron **OR** ondansetron (ZOFRAN) IV, oxyCODONE, sodium phosphate  Assessment: 75 yo female being started of CRRT. Pharmacy consulted for medication review and adjustment.   Plan:   Zosyn 3.375 was adjusted to every 8 hours for CRRT.   Continuous renal replacement therapy (CRRT) Glory Rosebush 2009; Trotman 2005): Drug clearance is highly dependent on the method of renal replacement, filter type, and flow rate. Appropriate dosing requires close monitoring of pharmacologic response, signs of adverse reactions due to drug accumulation, as well as drug concentrations in relation to target trough (if appropriate). General recommendations only (based on dialysate flow/ultrafiltration rates of 1 to 2 L/hour and minimal residual renal function)(Trotman 2005):  CVVHD: 2.25 to 3.375 g every 6 to 8 hours  Pharmacy will continue to review and adjust medications as needed.   Marria Mathison A 12/19/2015,11:42 AM

## 2015-12-19 NOTE — Progress Notes (Signed)
Alarm for high venous pressures (789, also low arterial pressure of -19). No kinks in lines, attempted to flush ports of dialysis catheter and red port flushed well but blue port occluded. CRRT stopped. tPA ordered to clear line per previous electronic order.

## 2015-12-19 NOTE — Progress Notes (Signed)
Spoke with RN - amiodarone order was initially entered yesterday but MD decided to hold it so it was not started. Confirmed no bolus given yesterday. Discontinued old order. Amiodarone bolus and drip to start today.  Lenis Noon, PharmD 12/19/2015 1:30 PM

## 2015-12-19 NOTE — Plan of Care (Signed)
Problem: Fluid Volume: Goal: Compliance with measures to maintain balanced fluid volume will improve Outcome: Not Progressing Patient on CRRT with no ultrafiltration per MD. Patient with gradually increasing CVP and edema, lower lungs very diminished but no crackles anywhere in lung fields. NG still to LIS, but not enough output even with maintenance flush and roughly 6 ice chips taken by patient throughout shift to reach area to mark on canister.

## 2015-12-19 NOTE — Progress Notes (Signed)
LCSW met with Wilkie Aye from [facility], and provided him with Hospice and Palliative handouts that his sister in law Linus Orn wanted. LCSW consulted with ICU nurses and exchanged info. LCSW went in and spoke with patient and wished her well/good health and explained I would be off til Friday and another SW would be available this week.  [employer] LCSW [phone]

## 2015-12-19 NOTE — Progress Notes (Signed)
CRITICAL VALUE ALERT  Critical value received:  Lactic Acid:3.8  Date of notification:  12/19/15  Time of notification:  [id number]  Critical value read back:Yes.    Nurse who received alert:  Jerl Santos RN  MD notified (1st page):  Dr. Mortimer Fries  Time of first page:  Verbal communication 1117  Responding MD:  Dr. Mortimer Fries  Time MD responded:  [phone]

## 2015-12-19 NOTE — Progress Notes (Signed)
Spoke with Dr. Holley Raring, nephrologist, on the phone about patient's fluid status (edema, CVPs, CRRT with no UFetc.. and serum CO2 levels with sodium acetate infusion. MD discontinued sodium acetate infusion.

## 2015-12-19 NOTE — Progress Notes (Signed)
CRITICAL VALUE ALERT  Critical value received:  Lactic Acid:3.4  Date of notification:  12/19/15  Time of notification:  0822  Critical value read back:Yes.    Nurse who received alert:  Jerl Santos RN  MD notified (1st page):  Dr. Mortimer Fries  Time of first page: Verbal communication at [facility]   Responding MD: Dr. Mortimer Fries  Time MD responded:  [phone]

## 2015-12-19 NOTE — Progress Notes (Signed)
CRRT restarted. Blue port able to flush and withdraw blood after earlier administration of tPA.

## 2015-12-19 NOTE — Plan of Care (Signed)
Problem: Pain Managment: Goal: General experience of comfort will improve Outcome: Progressing Patient's comfort level has been enhanced through PRN morphine. Medication effective in reliving discomfort.

## 2015-12-19 NOTE — Progress Notes (Signed)
[facility]  ROUNDING NOTE   Subjective:  Patient remains critically ill at this point in time. She was started on continuous renal replacement therapy yesterday. Nursing reports that this is progressing quite well at the moment. Quite hypotensive this a.m. with a blood pressure of 76/44.   Objective:  Vital signs in last 24 hours:  Temp:  [97.6 F (36.4 C)-98.2 F (36.8 C)] 97.6 F (36.4 C) (05/07 0800) Pulse Rate:  [93-141] 106 (05/07 0900) Resp:  [11-29] 12 (05/07 1000) BP: (76-141)/(43-117) 76/44 mmHg (05/07 1000) SpO2:  [87 %-100 %] 100 % (05/07 1000) Weight:  [96 kg (211 lb 10.3 oz)-99.7 kg (219 lb 12.8 oz)] 99.7 kg (219 lb 12.8 oz) (05/07 0435)  Weight change: 3.7 kg (8 lb 2.5 oz) Filed Weights   12/22/2015 1042 12/18/15 1215 12/19/15 0435  Weight: 92.3 kg (203 lb 7.8 oz) 96 kg (211 lb 10.3 oz) 99.7 kg (219 lb 12.8 oz)    Intake/Output: I/O last 3 completed shifts: In: 4054.3 [I.V.:3854.3; NG/GT:50; IV Piggyback:150] Out: 0    Intake/Output this shift:  Total I/O In: 224.1 [I.V.:204.1; NG/GT:20] Out: 0   Physical Exam: General: Critically ill appearing  Head: Normocephalic, atraumatic. Dry oral mucosal membranes. NG in place  Eyes: Anicteric  Neck: Supple, trachea midline  Lungs:  Diminished BS at bases, slightly increased work of breathing  Heart: S1S2 no rubs irregular tachycardic  Abdomen:  Some rigidity noted anteriorly, tender in b/l upper quadrants   Extremities: 3+ peripheral edema.  Neurologic: Nonfocal, moving all four extremities  Skin: No lesions  Access: R IJ PC in place    Basic Metabolic Panel:  Recent Labs Lab 12/18/15 1125 12/18/15 1512 12/18/15 1735 12/18/15 2137 12/19/15 0129 12/19/15 0531  NA 132* 132* 133* 133* 135 134*  K 4.3 4.3 4.4 4.3 4.3 4.3  CL 101 101 101 101 103 103  CO2 26 23 22 24 24 22   GLUCOSE 122* 116* 106* 97 97 95  BUN 33* 31* 29* 27* 26* 27*  CREATININE 5.13* 4.68* 4.35* 4.10* 3.97* 3.64*   CALCIUM 7.6* 7.6* 7.6* 7.5* 7.6* 7.6*  MG  --   --  1.8  --   --   --   PHOS 5.5* 4.9*  --  4.4 4.3 4.3    Liver Function Tests:  Recent Labs Lab 01/01/2016 0620 12/18/15 0132  12/18/15 1125 12/18/15 1512 12/18/15 2137 12/19/15 0129 12/19/15 0531  AST 261* 221*  --   --   --   --   --   --   ALT 148* 153*  --   --   --   --   --   --   ALKPHOS 79 64  --   --   --   --   --   --   BILITOT 1.0 1.2  --   --   --   --   --   --   PROT 8.3* 7.5  --   --   --   --   --   --   ALBUMIN 1.7* 1.5*  < > 1.3* 1.4* 1.3* 1.2* 1.3*  < > = values in this interval not displayed.  Recent Labs Lab 01/06/2016 0620  LIPASE 17   No results for input(s): AMMONIA in the last 168 hours.  CBC:  Recent Labs Lab 01/08/2016 0620 12/18/15 0228 12/19/15 0531  WBC 8.7 17.9* 15.9*  NEUTROABS 6.2  --  14.7*  HGB 11.3* 12.9 11.7*  HCT 35.4 40.3 36.2  MCV 95.0 95.5 94.3  PLT 205 224 100*    Cardiac Enzymes:  Recent Labs Lab 12/26/2015 0620 01/06/2016 1110 01/03/2016 2242  TROPONINI 0.53* 0.69* 0.70*    BNP: Invalid input(s): POCBNP  CBG:  Recent Labs Lab 12/18/15 0741 12/18/15 1145 12/18/15 1624 12/18/15 2119 12/19/15 0722  GLUCAP 95 105* 91 82 85    Microbiology: Results for orders placed or performed during the hospital encounter of 12/26/2015  Urine culture     Status: Abnormal (Preliminary result)   Collection Time: 12/28/2015  7:42 AM  Result Value Ref Range Status   Specimen Description URINE, CATHETERIZED  Final   Special Requests NONE  Final   Culture (A)  Final    >=100,000 COLONIES/mL YEAST IDENTIFICATION TO FOLLOW    Report Status PENDING  Incomplete  CULTURE, BLOOD (ROUTINE X 2) w Reflex to PCR ID Panel     Status: None (Preliminary result)   Collection Time: 01/04/2016  9:33 AM  Result Value Ref Range Status   Specimen Description BLOOD RIGHT FOREARM  Final   Special Requests BOTTLES DRAWN AEROBIC AND ANAEROBIC  2CC  Final   Culture NO GROWTH 1 DAY  Final   Report  Status PENDING  Incomplete  CULTURE, BLOOD (ROUTINE X 2) w Reflex to PCR ID Panel     Status: None (Preliminary result)   Collection Time: 01/08/2016  9:33 AM  Result Value Ref Range Status   Specimen Description BLOOD LEFT WRIST  Final   Special Requests BOTTLES DRAWN AEROBIC AND ANAEROBIC  1CC  Final   Culture NO GROWTH 1 DAY  Final   Report Status PENDING  Incomplete  MRSA PCR Screening     Status: None   Collection Time: 01/04/2016 11:04 AM  Result Value Ref Range Status   MRSA by PCR NEGATIVE NEGATIVE Final    Comment:        The GeneXpert MRSA Assay (FDA approved for NASAL specimens only), is one component of a comprehensive MRSA colonization surveillance program. It is not intended to diagnose MRSA infection nor to guide or monitor treatment for MRSA infections.     Coagulation Studies:  Recent Labs  12/29/2015 0620  LABPROT 19.3*  INR 1.62    Urinalysis:  Recent Labs  12/15/2015 0742  COLORURINE RED*  LABSPEC 1.034*  PHURINE 5.0  GLUCOSEU NEGATIVE  HGBUR 1+*  BILIRUBINUR NEGATIVE  KETONESUR NEGATIVE  PROTEINUR 100*  NITRITE NEGATIVE  LEUKOCYTESUR 2+*      Imaging: Dg Abd 1 View  12/24/2015  CLINICAL DATA:  NG tube placement EXAM: ABDOMEN - 1 VIEW COMPARISON:  11/16/2015 FINDINGS: There is NG tube in place with tip in proximal stomach. Gaseous distended small bowel loops are noted mid abdomen suspicious for ileus or partial small bowel obstruction. IMPRESSION: NG tube with tip in proximal stomach. Gaseous distended small bowel loops mid abdomen suspicious for ileus or partial bowel obstruction Electronically Signed   By: Lahoma Crocker M.D.   On: 12/27/2015 11:37   Ct Abdomen Pelvis W Contrast  01/06/2016  CLINICAL DATA:  Right upper quadrant abdominal pain. History of biloma. EXAM: CT ABDOMEN AND PELVIS WITH CONTRAST TECHNIQUE: Multidetector CT imaging of the abdomen and pelvis was performed using the standard protocol following bolus administration of intravenous  contrast. CONTRAST:  18m ISOVUE-300 IOPAMIDOL (ISOVUE-300) INJECTION 61% COMPARISON:  Chest CT - 01/06/2016; abdominal ultrasound - 12/29/2015 FINDINGS: Lower chest: Limited visualization of lower thorax demonstrates small to moderate size bilateral effusions  with associated atelectasis / collapse of the imaged portions of the bilateral lower lobes. Cardiomegaly. Coronary artery calcifications. The tip of a dialysis catheter terminates within the superior aspect of the right atrium. No pericardial effusion. Hepatobiliary: There is mild nodularity of the hepatic contour. No discrete hepatic lesions. Note is made of an approximately 1.8 x 1.5 cm gallstone within otherwise normal-appearing gallbladder. Small amount of intra-abdominal ascites. Pancreas: Normal appearance of the pancreas Spleen: Normal appearance of the spleen.  No evidence splenomegaly. Adrenals/Urinary Tract: There is symmetric enhancement of the bilateral kidneys. Renal cysts are seen bilaterally. No definite renal stones this postcontrast examination. No urine obstruction or perinephric stranding. Normal appearance of the bilateral adrenal glands. Normal appearance of the urinary bladder given underdistention. Several phleboliths are seen with the lower pelvis bilaterally, left greater than right. Stomach/Bowel: Small volume pneumoperitoneum as was demonstrated on chest CT performed earlier same day. Enteric contrast extends to the level of the mid small bowel. While the exact etiology of the small bowel pneumoperitoneum is not definitely demonstrated on the present examination, there is abnormal thickening involving the descending portion of the duodenum (axial image 39, series 2; coronal image 81, series 6). Small volume intra-abdominal ascites. No definable / drainable fluid collection. The colon is underdistended however there is no evidence of enteric obstruction. Normal appearance of the terminal ileum. The appendix is not visualized, however  there is no definitive pericecal inflammatory change. No pneumatosis or portal venous gas. Vascular/Lymphatic: Moderate amount of mixed calcified and noncalcified atherosclerotic plaque within a normal caliber abdominal aorta. The major branch vessel of the abdominal aorta appear patent on this non CTA examination. Several large varicosities are noted about the gastric fundus (representative images 24, 26 and 30, series 2. 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 Reproductive: Post hysterectomy. No discrete adnexal lesion. Small to moderate amount of intra-abdominal ascites tracks into the pelvic cul-de-sac. Other: Diffuse body wall anasarca. Subcutaneous areas of ill-defined stranding about the left lower abdomen are likely this sites of subcutaneous medication administration. Musculoskeletal: No acute or aggressive osseous abnormalities. 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 Degenerative change of the bilateral hips. IMPRESSION: 1. Small volume pneumoperitoneum, the etiology of which is not depicted on this examination however there is apparent abnormal thickening involving the descending portion of the duodenum. Further evaluation with endoscopy could performed as clinically indicated. 2. No definable/drainable intra-abdominal fluid collection. 3. Findings suggestive of cirrhosis and portal venous hypertension with small volume intra-abdominal ascites, several moderate-sized gastric varices and evidence of a splenorenal shunt. No evidence splenomegaly. Further evaluation with endoscopy could be performed as clinically indicated. 4. Findings compatible with pulmonary edema with small to moderate sized bilateral effusions and diffuse body wall anasarca. 5. Atherosclerosis including coronary artery calcifications. 6. Cholelithiasis without evidence of  cholecystitis. Critical Value/emergent results were called by telephone at the time of interpretation on 12/26/2015 at 2:21 pm to Dr. Caroleen Hamman , who verbally acknowledged these results. Electronically Signed   By: Sandi Mariscal M.D.   On: 01/06/2016 14:38   Dg Chest Port 1 View  01/09/2016  CLINICAL DATA:  Central line placement EXAM: PORTABLE CHEST 1 VIEW COMPARISON:  Earlier today FINDINGS: Moderate cardiomegaly. New left jugular central venous catheter place with its tip at the cavoatrial junction and no pneumothorax. NG tube placed with its tip in the stomach. Stable right jugular  dialysis catheter. Bilateral pleural effusions and bilateral pulmonary opacity are stable. IMPRESSION: New left jugular central venous catheter place with its tip at the cavoatrial junction and no pneumothorax. NG tube has also been placed with its tip in the stomach. Otherwise stable. Electronically Signed   By: Marybelle Killings M.D.   On: 01/08/2016 16:52     Medications:   . amiodarone Stopped (12/19/15 0030)  . norepinephrine 11 mcg/min (12/19/15 1002)  . pantoprozole (PROTONIX) infusion 8 mg/hr (12/19/15 0800)  . pureflow 2,500 mL/hr at 12/19/15 0300  . sterile water 1,000 mL with sodium acetate 150 mEq infusion 50 mL/hr at 12/19/15 0800   . amiodarone  150 mg Intravenous Once  . antiseptic oral rinse  7 mL Mouth Rinse BID  . barrier cream  1 application Topical TID  . docusate sodium  100 mg Oral QHS  . insulin aspart  0-5 Units Subcutaneous QHS  . insulin aspart  0-9 Units Subcutaneous TID WC  . [START ON 12/21/2015] pantoprazole (PROTONIX) IV  40 mg Intravenous Q12H  . piperacillin-tazobactam (ZOSYN)  IV  3.375 g Intravenous Q8H   acetaminophen **OR** acetaminophen, albuterol, bisacodyl, heparin, morphine injection, ondansetron **OR** ondansetron (ZOFRAN) IV, oxyCODONE, sodium phosphate  Assessment/ Plan:  75 y.o. female with atrial fibrillation, diabetes mellitus type II, hypertension, vertigo, coronary  artery disease, who was admitted to [facility] on 11/10/2015 with left pleural effusion. Status post left thoracentesis on 11/11/15.  Diagnosed with multiple myeloma that admission, pt declined treatment for myeloma.  Readmitted 12/2015 with suspected duodenal perforation, pt felt to have prohibitive perioperative mortality.    1.  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 Her lactic acidosis does appear to be slightly better this a.m. We will continue CRRT without ultrafiltration as she is hypotensive and tachycardic at this time.  2. Hypotension.  Likely related to potential sepsis andfree intraperitoneal abdominal area.   - Blood pressure noted to be low this a.m. however she did just receive morphine as well. Continue pressors to maintain a map of 65 or greater.  3.  Anemia of CKD/multiple myeloma. Hemoglobin currently 11.7. No indication for Procrit.  4.  Possible free intraperitoneal air:  Patient had dedicated CT scan of the abdomen and pelvis which showed some free air. Possible site of perforation may be in the duodenum.  Patient deemed to have prohibitive perioperative mortality given multiple comorbidities.  5. Overall the patient has a very guarded prognosis.     LOS: 2 Lisa Lowery 5/7/201710:11 AM

## 2015-12-19 NOTE — Progress Notes (Signed)
Patient's current BP is 84/55 with a MAP of 65. Currently on 20 of levo. Patient very lethargic. Verbal communication with Dr. Mortimer Fries to start vasopressin drip. Also he ordered IV solu cortef. Will continue to monitor.

## 2015-12-19 NOTE — Progress Notes (Signed)
eLink Physician-Brief Progress Note Patient Name: Lisa Lowery DOB: [birth year] MRN: [id number]   Date of Service  12/19/2015  HPI/Events of Note  Request to concentrate Norepinephrine IV infusion d/t renal failure.   eICU Interventions  Will order:  1. Concentrate Norepinephrine IV infusion.      Intervention Category Minor Interventions: Routine modifications to care plan (e.g. PRN medications for pain, fever)  Sommer,Steven Eugene 12/19/2015, 4:29 PM

## 2015-12-20 LAB — CBC
HEMATOCRIT: 37.8 % (ref 35.0–47.0)
HEMOGLOBIN: 12 g/dL (ref 12.0–16.0)
MCH: 30.4 pg (ref 26.0–34.0)
MCHC: 31.7 g/dL — AB (ref 32.0–36.0)
MCV: 96 fL (ref 80.0–100.0)
Platelets: 62 10*3/uL — ABNORMAL LOW (ref 150–440)
RBC: 3.94 MIL/uL (ref 3.80–5.20)
RDW: 22.1 % — ABNORMAL HIGH (ref 11.5–14.5)
WBC: 14.4 10*3/uL — ABNORMAL HIGH (ref 3.6–11.0)

## 2015-12-20 LAB — RENAL FUNCTION PANEL
ALBUMIN: 1.2 g/dL — AB (ref 3.5–5.0)
ALBUMIN: 1.3 g/dL — AB (ref 3.5–5.0)
ANION GAP: 11 (ref 5–15)
ANION GAP: 11 (ref 5–15)
BUN: 16 mg/dL (ref 6–20)
BUN: 17 mg/dL (ref 6–20)
CALCIUM: 7.9 mg/dL — AB (ref 8.9–10.3)
CO2: 20 mmol/L — ABNORMAL LOW (ref 22–32)
CO2: 19 mmol/L — ABNORMAL LOW (ref 22–32)
CREATININE: 2.29 mg/dL — AB (ref 0.44–1.00)
Calcium: 7.9 mg/dL — ABNORMAL LOW (ref 8.9–10.3)
Chloride: 103 mmol/L (ref 101–111)
Chloride: 104 mmol/L (ref 101–111)
Creatinine, Ser: 2.14 mg/dL — ABNORMAL HIGH (ref 0.44–1.00)
GFR calc Af Amer: 23 mL/min — ABNORMAL LOW (ref >60)
GFR calc non Af Amer: 20 mL/min — ABNORMAL LOW (ref >60)
GFR calc non Af Amer: 21 mL/min — ABNORMAL LOW (ref >60)
GFR, EST AFRICAN AMERICAN: 25 mL/min — AB (ref >60)
GLUCOSE: 80 mg/dL (ref 65–99)
GLUCOSE: 87 mg/dL (ref 65–99)
PHOSPHORUS: 4.3 mg/dL (ref 2.5–4.6)
PHOSPHORUS: 4.8 mg/dL — AB (ref 2.5–4.6)
POTASSIUM: 4.8 mmol/L (ref 3.5–5.1)
Potassium: 4.7 mmol/L (ref 3.5–5.1)
SODIUM: 134 mmol/L — AB (ref 135–145)
Sodium: 134 mmol/L — ABNORMAL LOW (ref 135–145)

## 2015-12-20 LAB — URINE CULTURE: Culture: >=100000 — AB

## 2015-12-20 LAB — GLUCOSE, CAPILLARY: Glucose-Capillary: 33 mg/dL — CL (ref 65–99)

## 2015-12-20 MED ORDER — DEXTROSE 50 % IV SOLN
INTRAVENOUS | Status: AC
  Administered 2015-12-20: 50 mL
  Filled 2015-12-20: qty 50

## 2015-12-22 LAB — CULTURE, BLOOD (ROUTINE X 2)
CULTURE: NO GROWTH
CULTURE: NO GROWTH

## 2016-01-03 ENCOUNTER — Inpatient Hospital Stay: Payer: 59 | Admitting: Oncology

## 2016-01-13 NOTE — Progress Notes (Signed)
Dr Betti Cruz spoke with pts sons Abbe Amsterdam and Octavia Bruckner).  Comfort care was decided upon.  They agree to having bipap removed and nasal cannula placed.  They agree to having pressors turned off. They agree to morphine for pt comfort. Chaplain called and at bedside.

## 2016-01-13 NOTE — Progress Notes (Signed)
Chaplain visited with family and provided a compassionate presence and support.  Minerva Fester [phone]

## 2016-01-13 NOTE — Progress Notes (Signed)
Almost immediately at pressor discontinuation pt went into vfib followed by asystole. See post mortem note. Son Abbe Amsterdam at bedside.  Chaplain here

## 2016-01-13 NOTE — Discharge Summary (Signed)
Physician Discharge Summary  Patient ID: Lisa Lowery MRN: [id number] DOB/AGE: 10/01/1940 75 y.o.  Admit date: 12/23/2015 Discharge date: 2016/01/09  Admission Diagnoses:  Discharge Diagnoses:  Active Problems:   Atrial fibrillation with RVR (HCC)   RUQ pain   Hypotension   Nausea   ESRD (end stage renal disease) (HCC)   Dyspnea   Hypoxia   Free intraperitoneal air   Protein-calorie malnutrition, severe   Discharged Condition: expired.   Hospital Course:   Patient was admitted with abdominal pain from [facility] facility, she quickly decompensated, required multiple pressors. Patient was seen by general surgical service. It was felt that the patient had a perforated viscus, she was started on empiric therapies including Protonix, antibiotics and pressors, in addition to a bicarbonate drip. Patient was also started on CRRT by the nephrology service. Her condition did not improve, she was felt that she was not a surgical candidate due to her morbid condition. The patient was DO NOT RESUSCITATE, DO NOT INTUBATE, her condition continued to deteriorate, and after further discussions with the patient's son, who felt that situation was hopeless, the patient was made comfort measures only and expired soon after.  Consults: general surgery  Significant Diagnostic Studies: CT chest; should not was suggestive of perforated viscus  Treatments: antibiotics: vancomycin  Discharge Exam: Blood pressure 87/61, pulse 85, temperature 95 F (35 C), temperature source Rectal, resp. rate 13, height 5\' 7"  (1.702 m), weight 211 lb 6.7 oz (95.9 kg), SpO2 98 %. Head: Normocephalic, without obvious abnormality, atraumatic  Disposition: Expired.      Medication List    ASK your doctor about these medications        albuterol (2.5 MG/3ML) 0.083% nebulizer solution  Commonly known as:  PROVENTIL  Take 2.5 mg by nebulization every 4 (four) hours as needed for wheezing or shortness of breath.     barrier  cream Crea  Commonly known as:  non-specified  Apply 1 application topically 3 (three) times daily.     docusate sodium 100 MG capsule  Commonly known as:  COLACE  Take 100 mg by mouth at bedtime.     feeding supplement (NEPRO CARB STEADY) Liqd  Take 237 mLs by mouth 2 (two) times daily between meals.     furosemide 20 MG tablet  Commonly known as:  LASIX  Take 20 mg by mouth 2 (two) times daily.     ondansetron 4 MG tablet  Commonly known as:  ZOFRAN  Take 4 mg by mouth 2 (two) times daily.         Signed: Laverle Hobby 01-09-16, 8:11 AM

## 2016-01-13 NOTE — Progress Notes (Signed)
Called her sons Doren Custard and Octavia Bruckner and left message for Liliane Channel of patients worsening condition and that they may want to come to the hospital. Arvil Chaco, NP spoke with son Doren Custard and updated of patients worsening condition.

## 2016-01-13 NOTE — Progress Notes (Signed)
[facility] Progess Note    ASSESSMENT/PLAN     75 yo white female with multiple medical issues admitted to ICU for acute resp distress from sepsis likley from perforated viscus,  PATIENT IS DNR/DNI  I discussed the patient's clinical status with Both sons morning at the bedside. Currently the patient is obtunded, minimally responsive to unresponsive, she is on CRRT, Levothroid, has been titrated up to 50 mics, vasopressors 0.04. The sons are understanding that the patient's condition is terminal, and she would not want to maintain in this state. Therefore, the patient is made comfort measures only, family is in agreement. Chaplain has been called.  PULMONARY Oxygen as needed  CARDIOVASCULAR Septic shock -vasopressors to keep MAP>65 -IVF's as prescribed Follow [place]   RENAL ESRD on HD Follow up nephrology recs -now on CRRT  GASTROINTESTINAL CT abd/Pelvis >>suggests perforated viscus  HEMATOLOGIC Follow CBC  INFECTIOUS Empiric abx for presumed perforated viscus -stop Vancomycin -PCR negative  ---------------------------------------   ----------------------------------------   Name: Lisa Lowery MRN: [id number] DOB: 06-10-1941    ADMISSION DATE:  12/31/2015     SUBJECTIVE:   Pt currently on the BiPAP and is obtunded, can not provide history or review of systems.     VITAL SIGNS: Temp:  [93.9 F (34.4 C)-98.7 F (37.1 C)] 95 F (35 C) (05/08 0715) Pulse Rate:  [81-129] 85 (05/08 0715) Resp:  [5-27] 13 (05/08 0715) BP: (63-114)/(18-92) 87/61 mmHg (05/08 0715) SpO2:  [90 %-100 %] 98 % (05/08 0715) FiO2 (%):  [30 %] 30 % (05/08 0715) Weight:  [211 lb 6.7 oz (95.9 kg)] 211 lb 6.7 oz (95.9 kg) (05/08 0535) HEMODYNAMICS: CVP:  [13 mmHg-21 mmHg] 14 mmHg VENTILATOR SETTINGS: Vent Mode:  [-]  FiO2 (%):  [30 %] 30 % INTAKE / OUTPUT:  Intake/Output Summary (Last 24 hours) at Jan 18, 2016 0759 Last data filed at 01/18/16 0700  Gross  per 24 hour  Intake 3041.95 ml  Output      0 ml  Net 3041.95 ml    PHYSICAL EXAMINATION: Physical Examination:   VS: BP 87/61 mmHg  Pulse 85  Temp(Src) 95 F (35 C) (Rectal)  Resp 13  Ht 5\' 7"  (1.702 m)  Wt 211 lb 6.7 oz (95.9 kg)  BMI 33.11 kg/m2  SpO2 98%  LMP  (Approximate)  General Appearance: No distress  Neuro:without focal findings, mental status . Reduce HEENT: PERRLA, EOM intact. Pulmonary: normal breath sounds  , reduced bilaterally. CardiovascularNormal S1,S2.  No m/r/g.   Abdomen: Benign, firm Renal:  No costovertebral tenderness  GU:  Not performed at this time. Endocrine: No evident thyromegaly. Skin:   warm, no rashes, no ecchymosis  Extremities: cyanosis, no clubbing.   LABS:   LABORATORY PANEL:   CBC  Recent Labs Lab 01-18-16 0506  WBC 14.4*  HGB 12.0  HCT 37.8  PLT 62*    Chemistries   Recent Labs Lab 12/18/15 0132  12/18/15 1735  01-18-2016 0506  NA 131*  < > 133*  < > 134*  K 4.6  < > 4.4  < > 4.8  CL 101  < > 101  < > 104  CO2 18*  < > 22  < > 19*  GLUCOSE 91  < > 106*  < > 80  BUN 36*  < > 29*  < > 16  CREATININE 5.86*  < > 4.35*  < > 2.14*  CALCIUM 7.8*  < > 7.6*  < > 7.9*  MG  --   --  1.8  --   --   PHOS  --   < >  --   < > 4.8*  AST 221*  --   --   --   --   ALT 153*  --   --   --   --   ALKPHOS 64  --   --   --   --   BILITOT 1.2  --   --   --   --   < > = values in this interval not displayed.   Recent Labs Lab 12/18/15 2119 12/19/15 0722 12/19/15 1120 12/19/15 1602 12/19/15 2115 01/16/2016 0737  GLUCAP 82 85 86 93 92 33*   No results for input(s): PHART, PCO2ART, PO2ART in the last 168 hours.  Recent Labs Lab 01/11/2016 0620 12/18/15 0132  12/19/15 2108 January 16, 2016 0059 2016/01/16 0506  AST 261* 221*  --   --   --   --   ALT 148* 153*  --   --   --   --   ALKPHOS 79 64  --   --   --   --   BILITOT 1.0 1.2  --   --   --   --   ALBUMIN 1.7* 1.5*  < > 1.3* 1.2* 1.3*  < > = values in this interval not  displayed.  Cardiac Enzymes  Recent Labs Lab 01/10/2016 2242  TROPONINI 0.70*    RADIOLOGY:  No results found.     Marda Stalker, MD.  ICU Pager: [phone] [facility] Office Number: [phone]  Patricia Pesa, M.D.  Vilinda Boehringer, M.D.  Merton Border, M.D  01/16/2016   Critical Care Attestation.  I have personally obtained a history, examined the patient, evaluated laboratory and imaging results, formulated the assessment and plan and placed orders. The Patient requires high complexity decision making for assessment and support, frequent evaluation and titration of therapies, application of advanced monitoring technologies and extensive interpretation of multiple databases. The patient has critical illness that could lead imminently to failure of 1 or more organ systems and requires the highest level of physician preparedness to intervene.  Critical Care Time devoted to patient care services described in this note is 35 minutes and is exclusive of time spent in procedures.

## 2016-01-13 DEATH — deceased

## 2016-06-23 IMAGING — CT CT ABD-PELV W/ CM
1 of 3 series · 12 of 32 positions shown, 17 images · IV contrast (iopamidol)
Comparison: Chest CT - 12/17/2015; abdominal ultrasound -
12/17/2015

CLINICAL DATA: Right upper quadrant abdominal pain. History of
biloma.

EXAM:
CT ABDOMEN AND PELVIS WITH CONTRAST
TECHNIQUE: Multidetector CT imaging of the abdomen and pelvis was performed
using the standard protocol following bolus administration of
intravenous contrast.
CONTRAST:  75mL JHPPCG-FFF IOPAMIDOL (JHPPCG-FFF) INJECTION 61%

[Series 2: routine abd pel with · axial · 0.79mm/px · z∈[-1180,-710]mm · 12 of 106 slices shown, 17 images]
[im 6/106  soft-tissue]
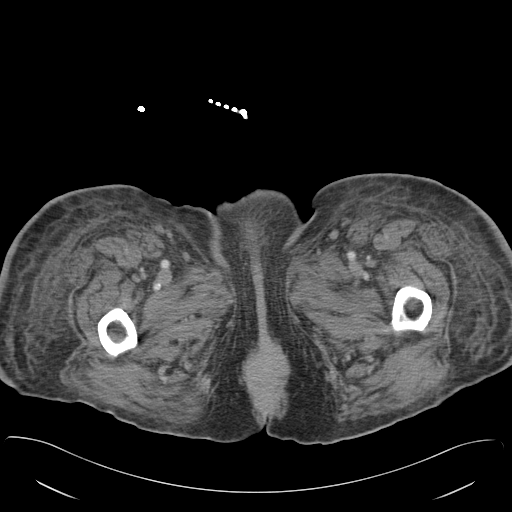
[im 6/106  bone]
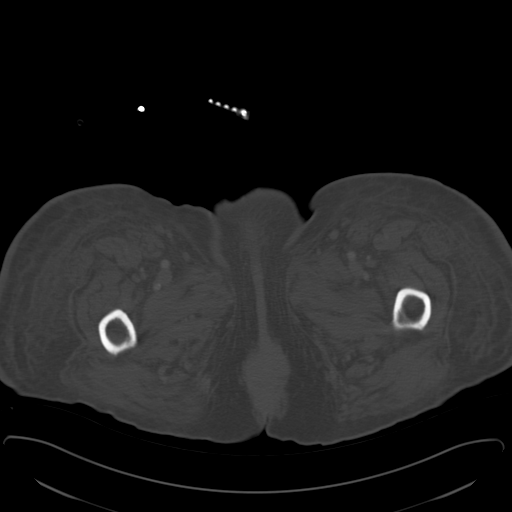
[im 18/106  soft-tissue]
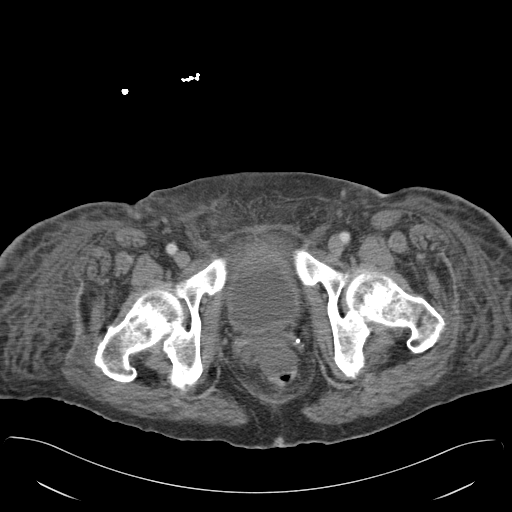
[im 24/106  soft-tissue]
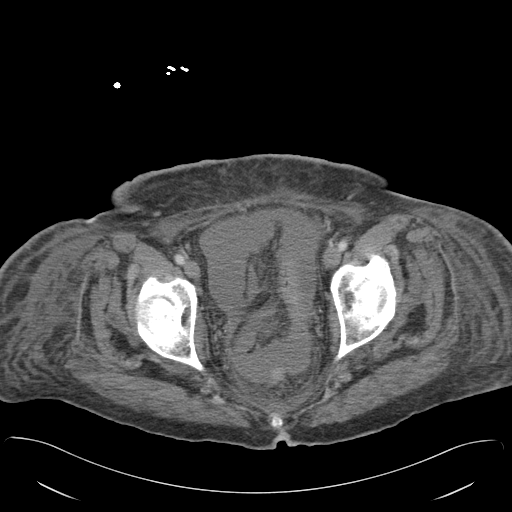
[im 36/106  soft-tissue]
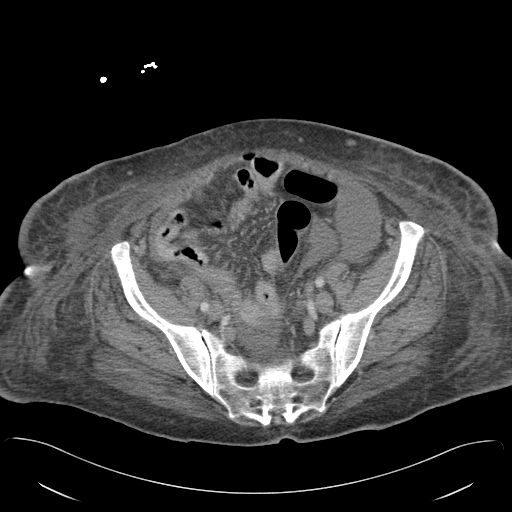
[im 41/106  soft-tissue]
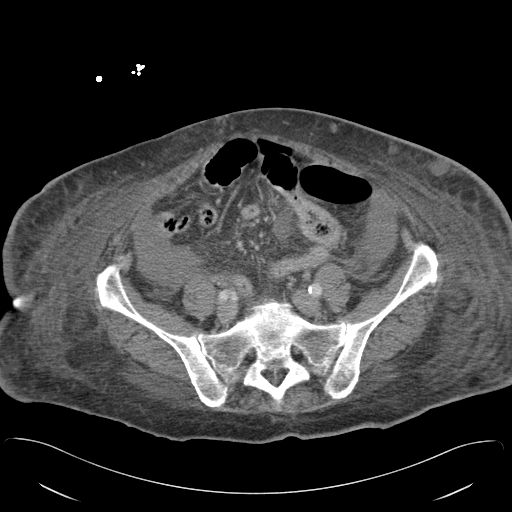
[im 53/106  soft-tissue]
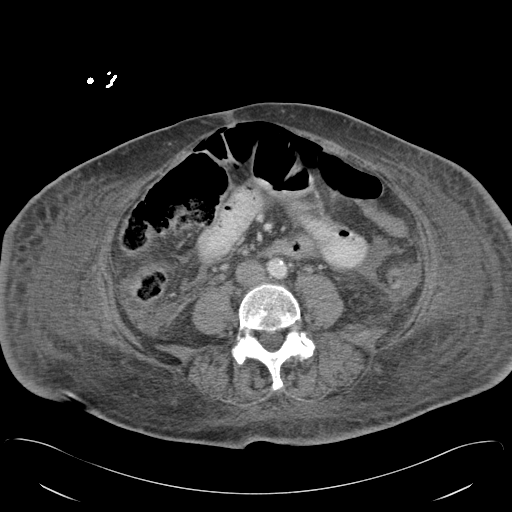
[im 65/106  soft-tissue]
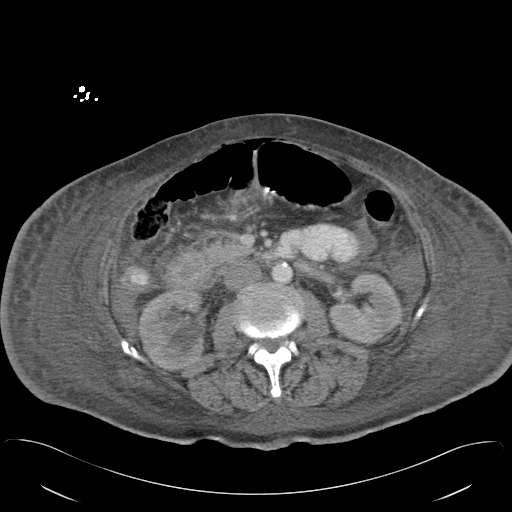
[im 71/106  soft-tissue]
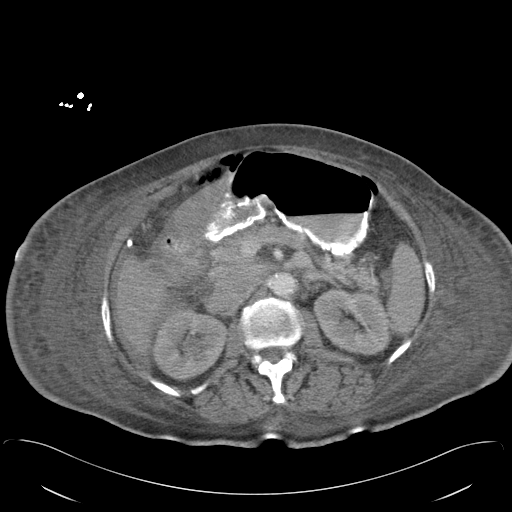
[im 82/106  soft-tissue]
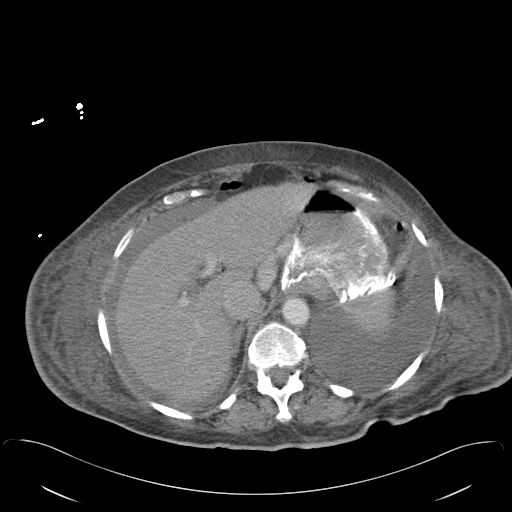
[im 82/106  lung]
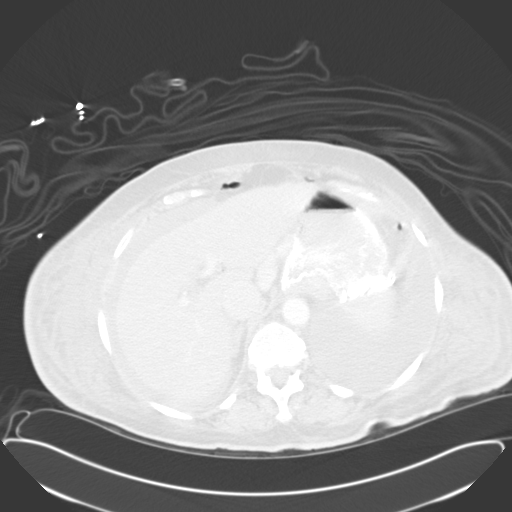
[im 82/106  bone]
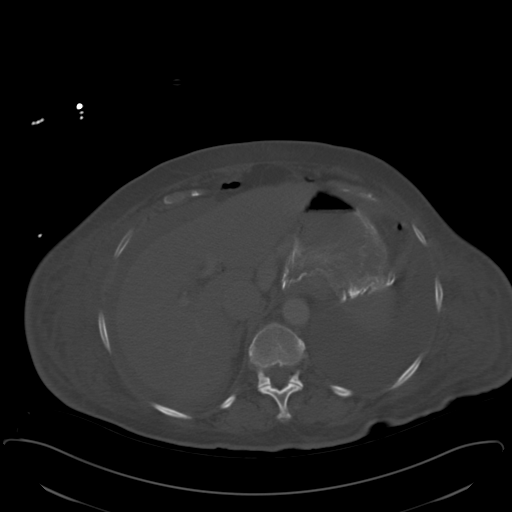
[im 88/106  soft-tissue]
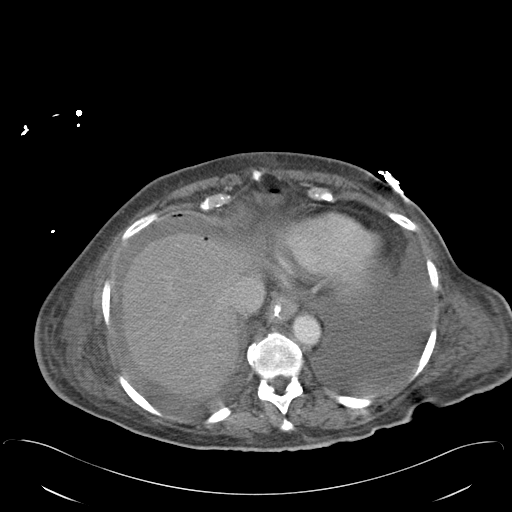
[im 88/106  lung]
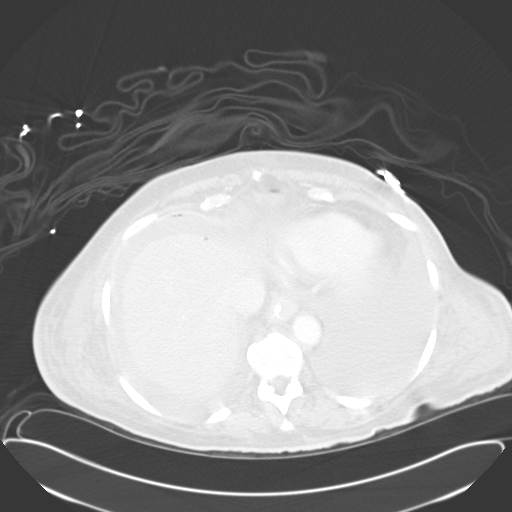
[im 94/106  lung]
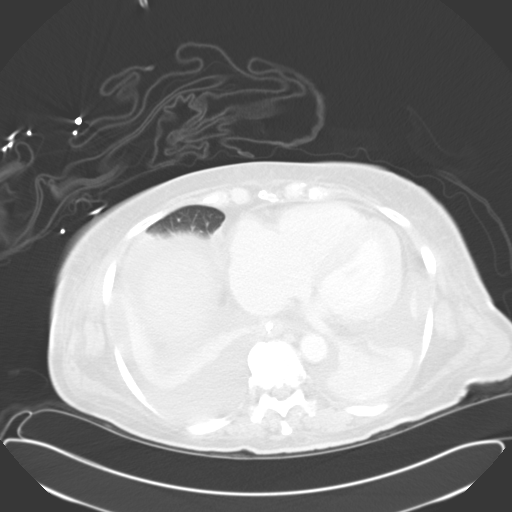
[im 100/106  soft-tissue]
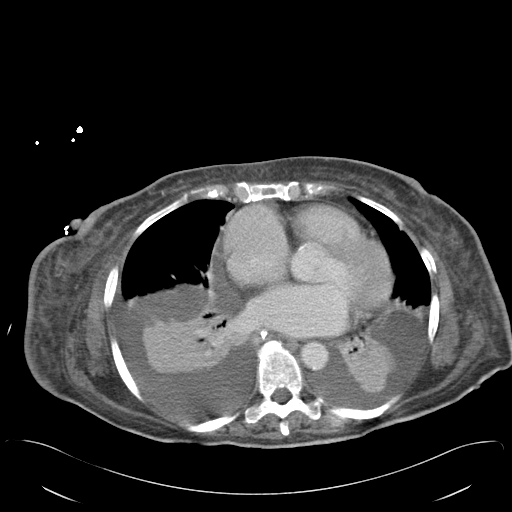
[im 100/106  lung]
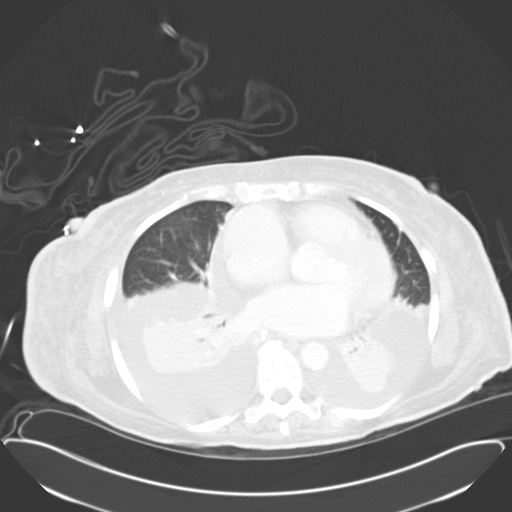

[12 of 32 positions shown; findings below may reference images not displayed]

FINDINGS: Lower chest: Limited visualization of lower thorax demonstrates
small to moderate size bilateral effusions with associated
atelectasis / collapse of the imaged portions of the bilateral lower
lobes.

Cardiomegaly. Coronary artery calcifications. The tip of a dialysis
catheter terminates within the superior aspect of the right atrium.
No pericardial effusion.

Hepatobiliary: There is mild nodularity of the hepatic contour. No
discrete hepatic lesions. Note is made of an approximately 1.8 x
cm gallstone within otherwise normal-appearing gallbladder. Small
amount of intra-abdominal ascites.

Pancreas: Normal appearance of the pancreas

Spleen: Normal appearance of the spleen.  No evidence splenomegaly.

Adrenals/Urinary Tract: There is symmetric enhancement of the
bilateral kidneys. Renal cysts are seen bilaterally. No definite
renal stones this postcontrast examination. No urine obstruction or
perinephric stranding.

Normal appearance of the bilateral adrenal glands. Normal appearance
of the urinary bladder given underdistention. Several phleboliths
are seen with the lower pelvis bilaterally, left greater than right.

Stomach/Bowel: Small volume pneumoperitoneum as was demonstrated on
chest CT performed earlier same day. Enteric contrast extends to the
level of the mid small bowel. While the exact etiology of the small
bowel pneumoperitoneum is not definitely demonstrated on the present
examination, there is abnormal thickening involving the descending
portion of the duodenum (axial image 39, series 2; coronal image 81,
series 6).

Small volume intra-abdominal ascites. No definable / drainable fluid
collection.

The colon is underdistended however there is no evidence of enteric
obstruction. Normal appearance of the terminal ileum. The appendix
is not visualized, however there is no definitive pericecal
inflammatory change. No pneumatosis or portal venous gas.

Vascular/Lymphatic: Moderate amount of mixed calcified and
noncalcified atherosclerotic plaque within a normal caliber
abdominal aorta. The major branch vessel of the abdominal aorta
appear patent on this non CTA examination.

Several large varicosities are noted about the gastric fundus
(representative images 24, 26 and 30, series 2. Note is made of a
splenorenal shunt (coronal image 78, series 6).

Reproductive: Post hysterectomy. No discrete adnexal lesion. Small
to moderate amount of intra-abdominal ascites tracks into the pelvic
cul-de-sac.

Other: Diffuse body wall anasarca. Subcutaneous areas of ill-defined
stranding about the left lower abdomen are likely this sites of
subcutaneous medication administration.

Musculoskeletal: No acute or aggressive osseous abnormalities.
Stigmata of DISH within the thoracic spine. Mild-to-moderate
multilevel lumbar spine DDD, worse at L5-S1 with disc space height
loss, endplate irregularity and sclerosis. Degenerative change of
the bilateral hips.
IMPRESSION: 1. Small volume pneumoperitoneum, the etiology of which is not
depicted on this examination however there is apparent abnormal
thickening involving the descending portion of the duodenum. Further
evaluation with endoscopy could performed as clinically indicated.
2. No definable/drainable intra-abdominal fluid collection.
3. Findings suggestive of cirrhosis and portal venous hypertension
with small volume intra-abdominal ascites, several moderate-sized
gastric varices and evidence of a splenorenal shunt. No evidence
splenomegaly. Further evaluation with endoscopy could be performed
as clinically indicated.
4. Findings compatible with pulmonary edema with small to moderate
sized bilateral effusions and diffuse body wall anasarca.
5. Atherosclerosis including coronary artery calcifications.
6. Cholelithiasis without evidence of cholecystitis.

Critical Value/emergent results were called by telephone at the time
of interpretation on 12/17/2015 at [DATE] to Dr. PAULUS N CEEJAY , who
verbally acknowledged these results.

## 2016-06-23 IMAGING — DX DG CHEST 1V PORT
1 series · 1 of 1 positions shown · non-contrast
Comparison: Earlier today

CLINICAL DATA: Central line placement

EXAM:
PORTABLE CHEST 1 VIEW

[chest ap]
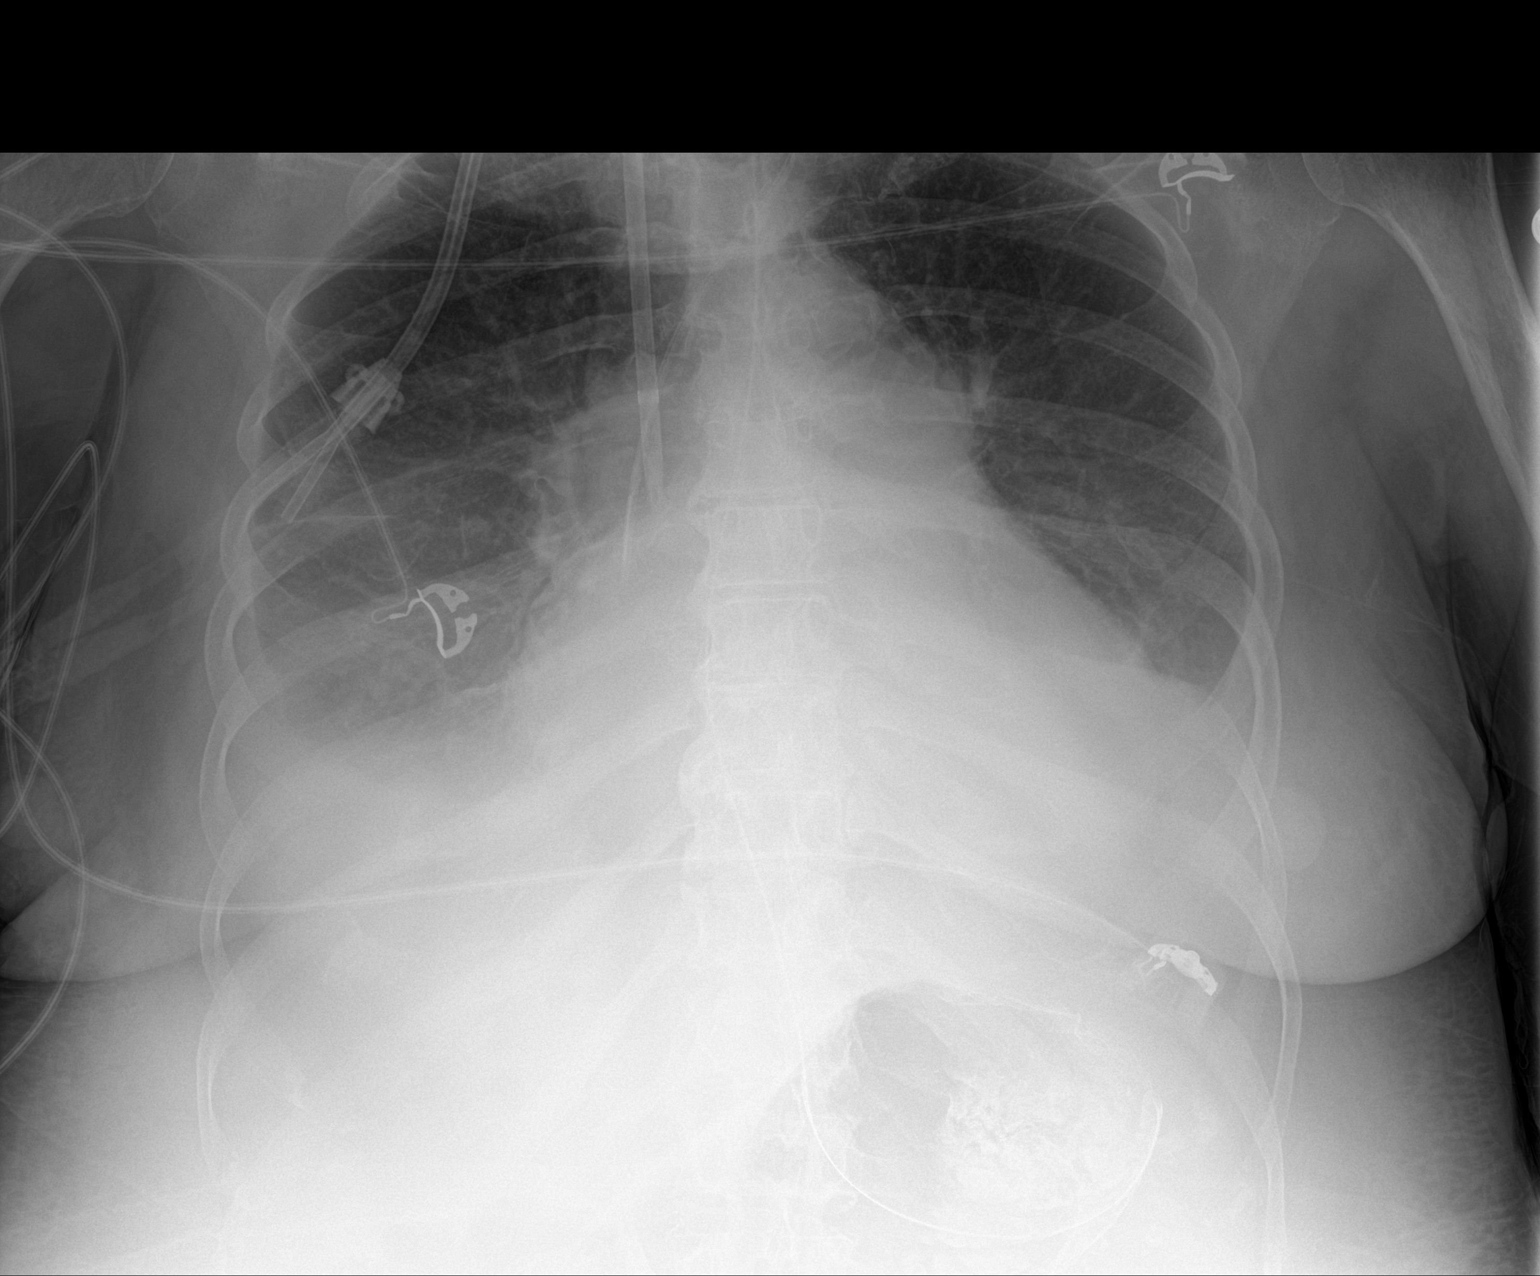

[1 of 1 positions shown; findings below may reference images not displayed]

FINDINGS: Moderate cardiomegaly. New left jugular central venous catheter
place with its tip at the cavoatrial junction and no pneumothorax.
NG tube placed with its tip in the stomach. Stable right jugular
dialysis catheter. Bilateral pleural effusions and bilateral
pulmonary opacity are stable.
IMPRESSION: New left jugular central venous catheter place with its tip at the
cavoatrial junction and no pneumothorax.

NG tube has also been placed with its tip in the stomach.

Otherwise stable.

## 2017-02-09 IMAGING — US US ABDOMEN LIMITED
1 series · 14 of 25 positions shown · non-contrast
Comparison: 11/16/2015

CLINICAL DATA: Right chest pain, shortness of breath.

EXAM:
US ABDOMEN LIMITED - RIGHT UPPER QUADRANT

[Series 1: us abdomen limited · 0.19mm/px · 14 of 110 slices shown]
[im 1/110]
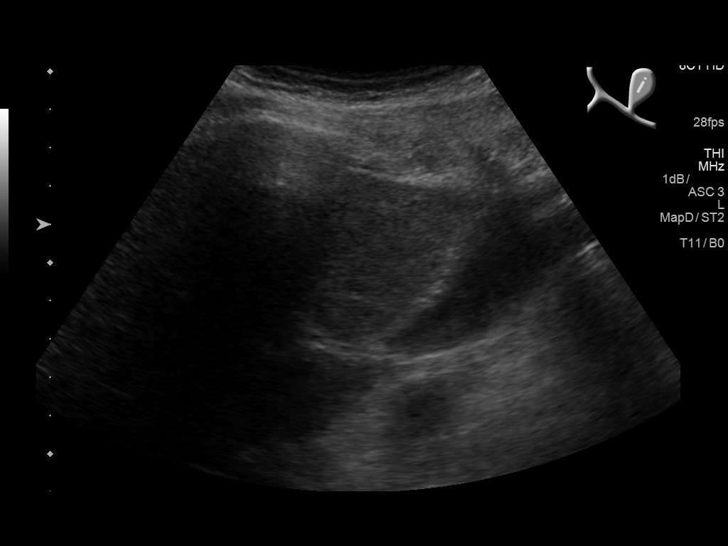
[im 10/110]
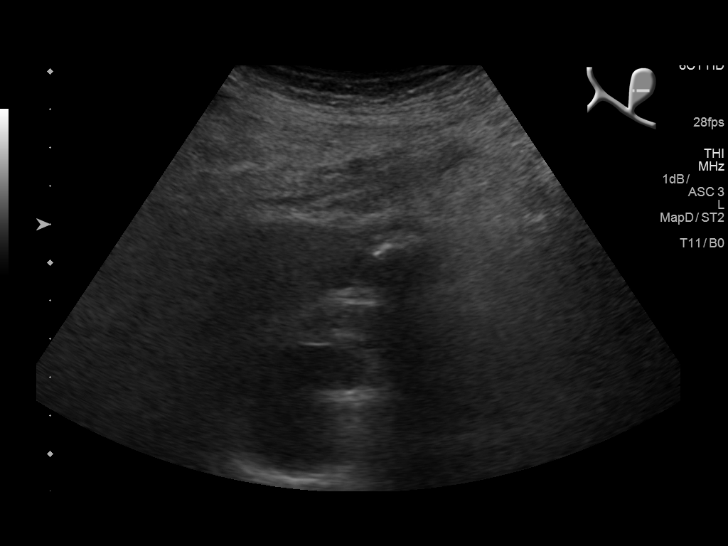
[im 19/110]
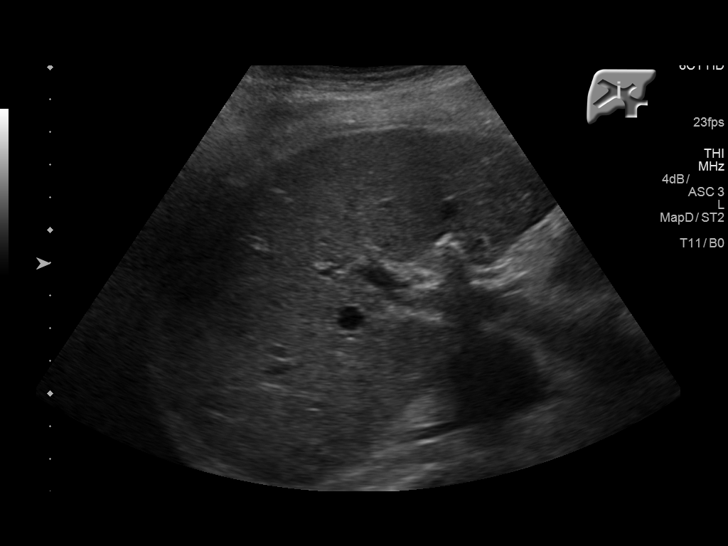
[im 28/110]
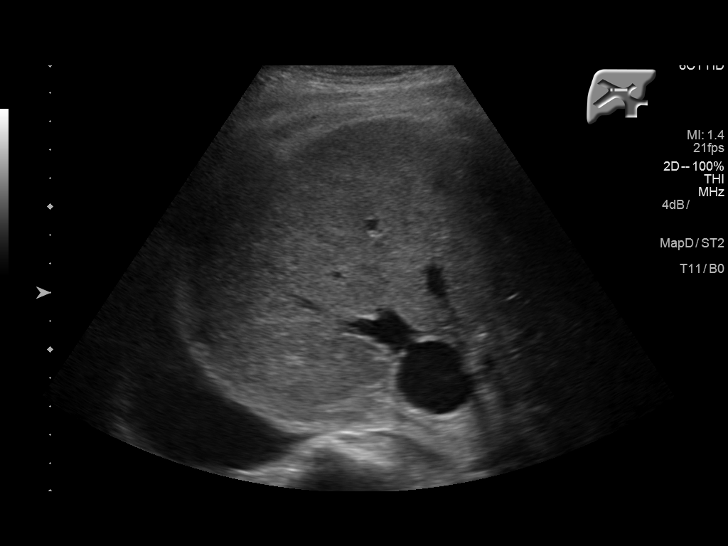
[im 37/110]
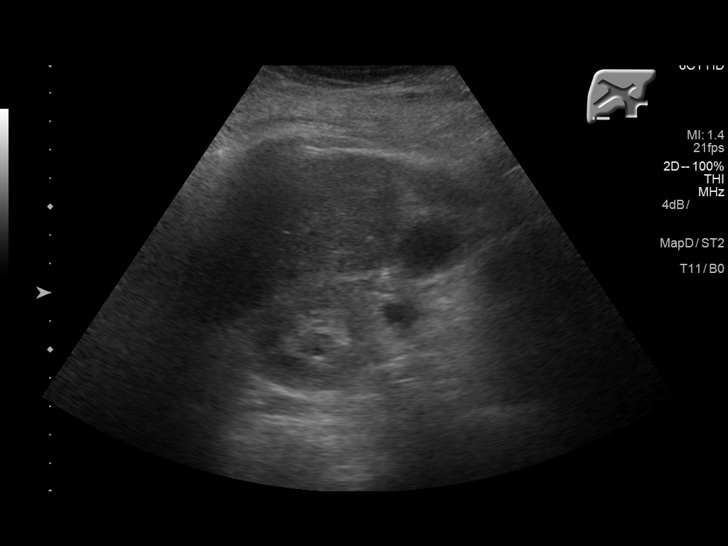
[im 41/110]
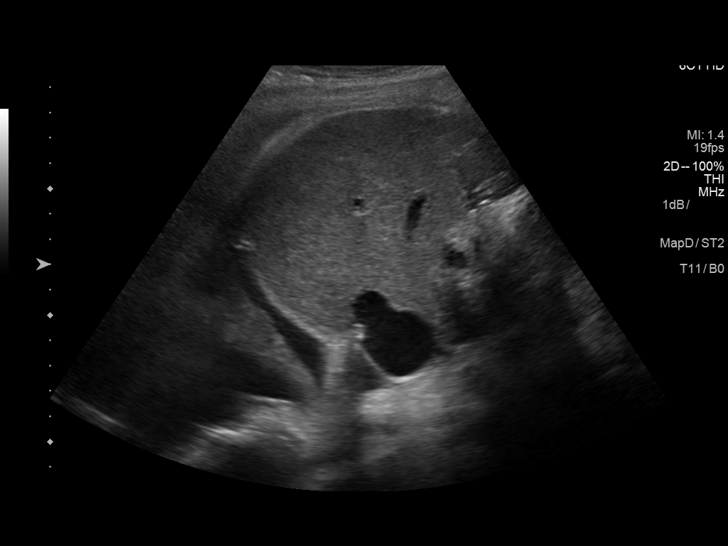
[im 50/110]
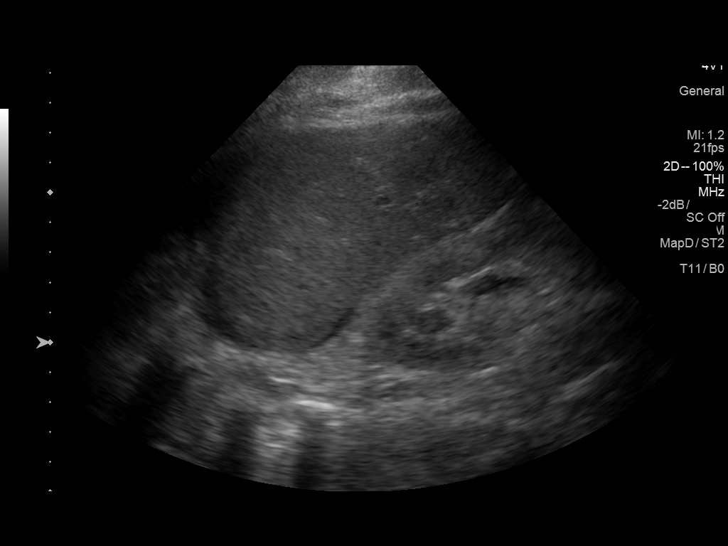
[im 60/110]
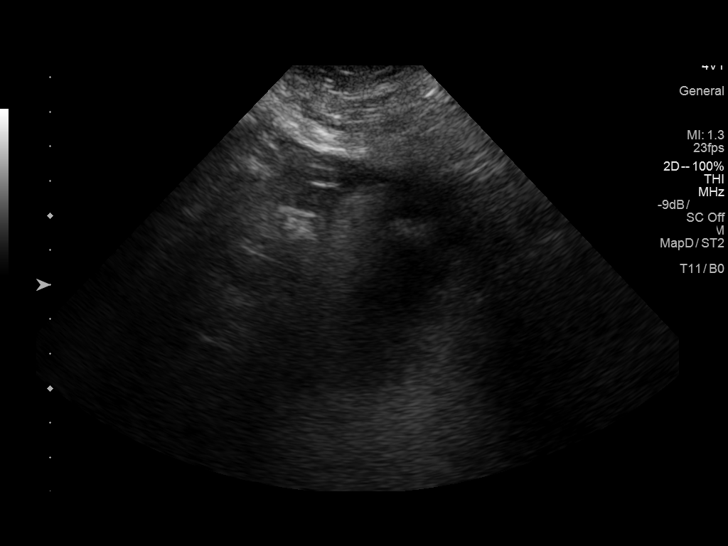
[im 69/110]
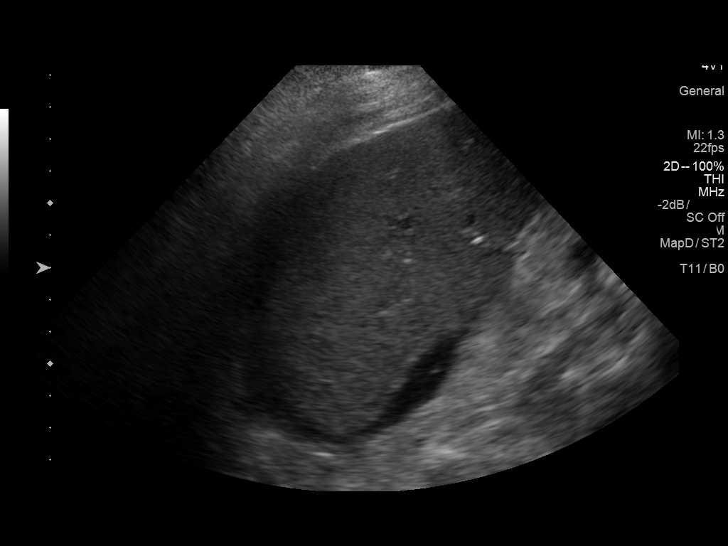
[im 73/110]
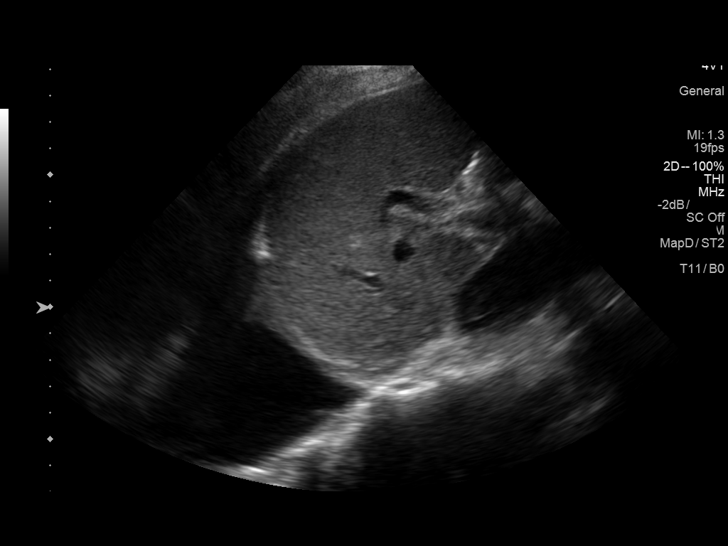
[im 82/110]
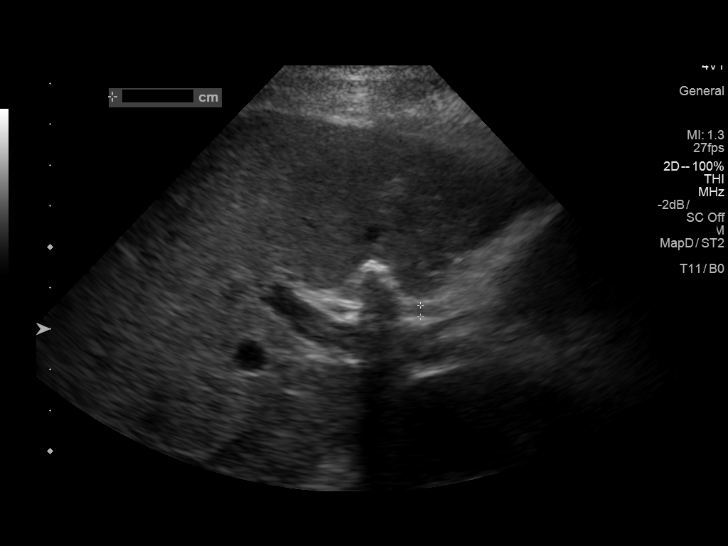
[im 91/110]
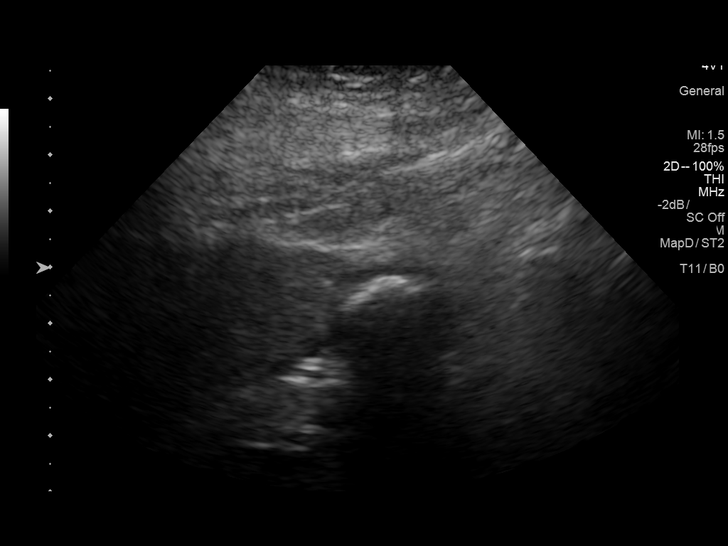
[im 100/110]
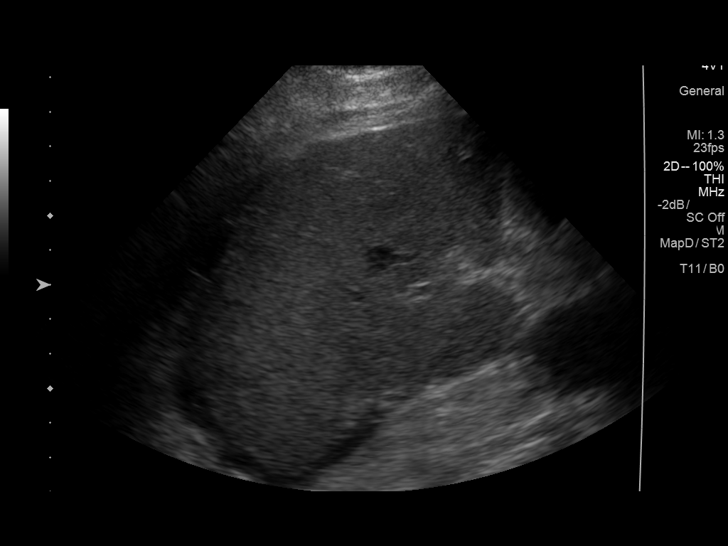
[im 110/110]
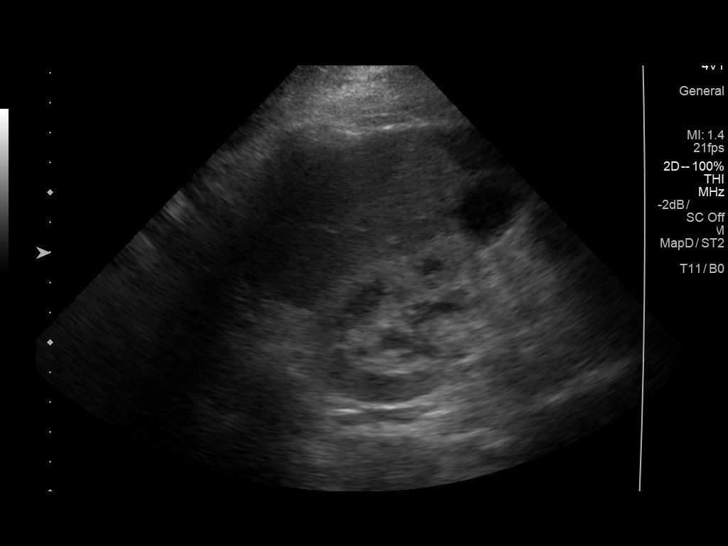

[14 of 25 positions shown; findings below may reference images not displayed]

FINDINGS: Gallbladder:

Single large stone again noted within the gallbladder, measuring up
to 2.1 cm. No wall thickening. Negative sonographic [place].

Common bile duct:

Diameter: Normal caliber, 3 mm.

Liver:

No focal lesion identified. Within normal limits in parenchymal
echogenicity.

Incidentally noted is mild perihepatic ascites and right effusion
IMPRESSION: Cholelithiasis.  No sonographic evidence of acute cholecystitis.

Right effusion.  Perihepatic ascites.
